# Patient Record
Sex: Female | Born: 1955 | Race: White | Hispanic: No | Marital: Married | Smoking: Never smoker
Health system: Southern US, Community
[De-identification: ages and names within clinical notes are randomized; demographics above are authoritative.]

## PROBLEM LIST (undated history)

## (undated) DIAGNOSIS — K811 Chronic cholecystitis: Secondary | ICD-10-CM

## (undated) DIAGNOSIS — I10 Essential (primary) hypertension: Secondary | ICD-10-CM

## (undated) DIAGNOSIS — C549 Malignant neoplasm of corpus uteri, unspecified: Secondary | ICD-10-CM

## (undated) DIAGNOSIS — Z8041 Family history of malignant neoplasm of ovary: Secondary | ICD-10-CM

## (undated) DIAGNOSIS — M1990 Unspecified osteoarthritis, unspecified site: Secondary | ICD-10-CM

## (undated) DIAGNOSIS — T7840XA Allergy, unspecified, initial encounter: Secondary | ICD-10-CM

## (undated) DIAGNOSIS — Z800 Family history of malignant neoplasm of digestive organs: Secondary | ICD-10-CM

## (undated) DIAGNOSIS — E785 Hyperlipidemia, unspecified: Secondary | ICD-10-CM

## (undated) HISTORY — DX: Malignant neoplasm of corpus uteri, unspecified: C54.9

## (undated) HISTORY — DX: Family history of malignant neoplasm of digestive organs: Z80.0

## (undated) HISTORY — DX: Hyperlipidemia, unspecified: E78.5

## (undated) HISTORY — PX: EYE SURGERY: SHX253

## (undated) HISTORY — DX: Unspecified osteoarthritis, unspecified site: M19.90

## (undated) HISTORY — PX: COLONOSCOPY: SHX174

## (undated) HISTORY — DX: Family history of malignant neoplasm of ovary: Z80.41

## (undated) HISTORY — PX: APPENDECTOMY: SHX54

## (undated) HISTORY — DX: Allergy, unspecified, initial encounter: T78.40XA

## (undated) HISTORY — DX: Chronic cholecystitis: K81.1

---

## 2005-05-23 LAB — CONVERTED CEMR LAB: Pap Smear: NORMAL

## 2006-09-30 ENCOUNTER — Ambulatory Visit: Payer: Self-pay | Admitting: Family Medicine

## 2006-09-30 DIAGNOSIS — E6609 Other obesity due to excess calories: Secondary | ICD-10-CM | POA: Insufficient documentation

## 2006-09-30 DIAGNOSIS — D259 Leiomyoma of uterus, unspecified: Secondary | ICD-10-CM | POA: Insufficient documentation

## 2006-09-30 DIAGNOSIS — E669 Obesity, unspecified: Secondary | ICD-10-CM

## 2006-09-30 DIAGNOSIS — I10 Essential (primary) hypertension: Secondary | ICD-10-CM | POA: Insufficient documentation

## 2006-10-03 LAB — CONVERTED CEMR LAB
ALT: 16 units/L (ref 0–35)
AST: 18 units/L (ref 0–37)
Alkaline Phosphatase: 62 units/L (ref 39–117)
Bilirubin, Direct: 0.1 mg/dL (ref 0.0–0.3)
CO2: 26 meq/L (ref 19–32)
Calcium: 8.9 mg/dL (ref 8.4–10.5)
Chloride: 101 meq/L (ref 96–112)
Cholesterol: 177 mg/dL (ref 0–200)
Creatinine, Ser: 0.7 mg/dL (ref 0.4–1.2)
Glucose, Bld: 99 mg/dL (ref 70–99)
Total Bilirubin: 0.8 mg/dL (ref 0.3–1.2)
Total CHOL/HDL Ratio: 5.2
Total Protein: 7.1 g/dL (ref 6.0–8.3)

## 2006-10-12 ENCOUNTER — Encounter: Payer: Self-pay | Admitting: Family Medicine

## 2006-10-28 ENCOUNTER — Ambulatory Visit: Payer: Self-pay | Admitting: Gastroenterology

## 2006-11-10 ENCOUNTER — Ambulatory Visit: Payer: Self-pay | Admitting: Gastroenterology

## 2006-11-10 ENCOUNTER — Encounter: Payer: Self-pay | Admitting: Family Medicine

## 2006-11-10 ENCOUNTER — Encounter: Payer: Self-pay | Admitting: Gastroenterology

## 2007-10-25 ENCOUNTER — Telehealth: Payer: Self-pay | Admitting: Family Medicine

## 2007-11-22 ENCOUNTER — Encounter: Payer: Self-pay | Admitting: Family Medicine

## 2007-11-22 ENCOUNTER — Other Ambulatory Visit: Admission: RE | Admit: 2007-11-22 | Discharge: 2007-11-22 | Payer: Self-pay | Admitting: Family Medicine

## 2007-11-22 ENCOUNTER — Ambulatory Visit: Payer: Self-pay | Admitting: Family Medicine

## 2007-11-22 DIAGNOSIS — M19049 Primary osteoarthritis, unspecified hand: Secondary | ICD-10-CM | POA: Insufficient documentation

## 2007-11-24 ENCOUNTER — Telehealth (INDEPENDENT_AMBULATORY_CARE_PROVIDER_SITE_OTHER): Payer: Self-pay | Admitting: *Deleted

## 2007-11-24 LAB — CONVERTED CEMR LAB
ALT: 52 units/L — ABNORMAL HIGH (ref 0–35)
Alkaline Phosphatase: 67 units/L (ref 39–117)
Bilirubin, Direct: 0.2 mg/dL (ref 0.0–0.3)
CO2: 29 meq/L (ref 19–32)
Glucose, Bld: 88 mg/dL (ref 70–99)
Potassium: 4.5 meq/L (ref 3.5–5.1)
Sodium: 142 meq/L (ref 135–145)
Total Bilirubin: 0.8 mg/dL (ref 0.3–1.2)
Total CHOL/HDL Ratio: 5.4
Total Protein: 7.2 g/dL (ref 6.0–8.3)

## 2007-12-12 ENCOUNTER — Encounter: Payer: Self-pay | Admitting: Family Medicine

## 2008-09-09 ENCOUNTER — Telehealth: Payer: Self-pay | Admitting: Family Medicine

## 2008-11-22 ENCOUNTER — Ambulatory Visit: Payer: Self-pay | Admitting: Family Medicine

## 2008-11-25 LAB — CONVERTED CEMR LAB
ALT: 24 units/L (ref 0–35)
AST: 22 units/L (ref 0–37)
Albumin: 3.9 g/dL (ref 3.5–5.2)
Alkaline Phosphatase: 63 units/L (ref 39–117)
Basophils Relative: 0 % (ref 0.0–3.0)
Bilirubin, Direct: 0.1 mg/dL (ref 0.0–0.3)
CO2: 28 meq/L (ref 19–32)
Calcium: 9.1 mg/dL (ref 8.4–10.5)
Creatinine, Ser: 0.8 mg/dL (ref 0.4–1.2)
Eosinophils Relative: 1.8 % (ref 0.0–5.0)
HDL: 32.8 mg/dL — ABNORMAL LOW (ref >39.00)
Hemoglobin: 13.3 g/dL (ref 12.0–15.0)
LDL Cholesterol: 106 mg/dL — ABNORMAL HIGH (ref 0–99)
Lymphocytes Relative: 35 % (ref 12.0–46.0)
MCHC: 34.3 g/dL (ref 30.0–36.0)
Monocytes Relative: 5.4 % (ref 3.0–12.0)
Neutro Abs: 5 10*3/uL (ref 1.4–7.7)
Neutrophils Relative %: 57.8 % (ref 43.0–77.0)
RBC: 4.45 M/uL (ref 3.87–5.11)
Sodium: 141 meq/L (ref 135–145)
Total CHOL/HDL Ratio: 5
Total Protein: 6.8 g/dL (ref 6.0–8.3)
Triglycerides: 106 mg/dL (ref 0.0–149.0)
WBC: 8.7 10*3/uL (ref 4.5–10.5)

## 2008-11-26 ENCOUNTER — Ambulatory Visit: Payer: Self-pay | Admitting: Family Medicine

## 2008-11-26 DIAGNOSIS — M1710 Unilateral primary osteoarthritis, unspecified knee: Secondary | ICD-10-CM

## 2008-11-26 DIAGNOSIS — IMO0002 Reserved for concepts with insufficient information to code with codable children: Secondary | ICD-10-CM | POA: Insufficient documentation

## 2009-05-28 ENCOUNTER — Telehealth: Payer: Self-pay | Admitting: Family Medicine

## 2010-03-11 ENCOUNTER — Telehealth (INDEPENDENT_AMBULATORY_CARE_PROVIDER_SITE_OTHER): Payer: Self-pay | Admitting: *Deleted

## 2010-03-16 ENCOUNTER — Ambulatory Visit
Admission: RE | Admit: 2010-03-16 | Discharge: 2010-03-16 | Payer: Self-pay | Source: Home / Self Care | Attending: Family Medicine | Admitting: Family Medicine

## 2010-03-16 ENCOUNTER — Other Ambulatory Visit: Payer: Self-pay | Admitting: Family Medicine

## 2010-03-16 LAB — BASIC METABOLIC PANEL
GFR: 103.75 mL/min (ref >60.00)
Glucose, Bld: 88 mg/dL (ref 70–99)
Potassium: 5.3 mEq/L — ABNORMAL HIGH (ref 3.5–5.1)
Sodium: 138 mEq/L (ref 135–145)

## 2010-03-16 LAB — LIPID PANEL
LDL Cholesterol: 115 mg/dL — ABNORMAL HIGH (ref 0–99)
Total CHOL/HDL Ratio: 5
VLDL: 19.2 mg/dL (ref 0.0–40.0)

## 2010-03-16 LAB — HEPATIC FUNCTION PANEL
AST: 20 U/L (ref 0–37)
Albumin: 4 g/dL (ref 3.5–5.2)
Alkaline Phosphatase: 65 U/L (ref 39–117)
Bilirubin, Direct: 0.1 mg/dL (ref 0.0–0.3)

## 2010-03-23 ENCOUNTER — Other Ambulatory Visit: Payer: Self-pay | Admitting: Family Medicine

## 2010-03-23 ENCOUNTER — Ambulatory Visit
Admission: RE | Admit: 2010-03-23 | Discharge: 2010-03-23 | Payer: Self-pay | Source: Home / Self Care | Attending: Family Medicine | Admitting: Family Medicine

## 2010-03-23 ENCOUNTER — Other Ambulatory Visit (HOSPITAL_COMMUNITY)
Admission: RE | Admit: 2010-03-23 | Discharge: 2010-03-23 | Disposition: A | Discharge status: Home / Self Care | Payer: Federal, State, Local not specified - PPO | Source: Ambulatory Visit | Attending: Family Medicine | Admitting: Family Medicine

## 2010-03-23 DIAGNOSIS — Z0189 Encounter for other specified special examinations: Secondary | ICD-10-CM | POA: Insufficient documentation

## 2010-03-23 DIAGNOSIS — E786 Lipoprotein deficiency: Secondary | ICD-10-CM

## 2010-03-23 DIAGNOSIS — N950 Postmenopausal bleeding: Secondary | ICD-10-CM | POA: Insufficient documentation

## 2010-03-23 DIAGNOSIS — Z1159 Encounter for screening for other viral diseases: Secondary | ICD-10-CM | POA: Insufficient documentation

## 2010-03-23 DIAGNOSIS — E7800 Pure hypercholesterolemia, unspecified: Secondary | ICD-10-CM | POA: Insufficient documentation

## 2010-03-25 NOTE — Progress Notes (Signed)
Summary: meloxicam  Phone Note Refill Request Message from:  Scriptline on May 28, 2009 8:59 AM  Refills Requested: Medication #1:  MELOXICAM 15 MG TABS Take 1 tablet by mouth once a day.   Supply Requested: 1 month [facility] [address]   Method Requested: Electronic Initial call taken by: Benny Lennert CMA (AAMA),  May 28, 2009 9:00 AM    Prescriptions: MELOXICAM 15 MG TABS (MELOXICAM) Take 1 tablet by mouth once a day  #30 x 3   Entered and Authorized by:   Kerby Nora MD   Signed by:   Kerby Nora MD on 05/28/2009   Method used:   Electronically to        [id number]       [address]       [place], [place]  [address]       Ph: [phone]       Fax: [phone]   RxID:   [id number]

## 2010-03-26 NOTE — Progress Notes (Signed)
----   Converted from flag ---- ---- 03/10/2010 5:41 PM, Kerby Nora MD wrote: Dx 401.1, v77.91 CMET, lipids  ---- 03/10/2010 11:12 AM, Liane Comber CMA (AAMA) wrote: Lab orders please! Good Morning! This pt is scheduled for cpx labs Monday, which labs to draw and dx codes to use? Thanks Tasha ------------------------------

## 2010-03-27 ENCOUNTER — Encounter: Payer: Self-pay | Admitting: Family Medicine

## 2010-03-27 ENCOUNTER — Encounter (INDEPENDENT_AMBULATORY_CARE_PROVIDER_SITE_OTHER): Payer: Self-pay | Admitting: *Deleted

## 2010-03-27 ENCOUNTER — Ambulatory Visit: Payer: Self-pay | Admitting: Family Medicine

## 2010-04-01 NOTE — Letter (Signed)
Summary: Results Follow up Letter  [place] [facility]  [address] [place], [place] [address]   Phone: [phone]  Fax: [phone]    03/27/2010 MRN: [id number]        [facility] [address] [place], [place]  [address]      Dear Ms. Mccown,  The following are the results of your recent test(s):  Test         Result    Pap Smear:        Normal ___x__  Not Normal _____ Comments:Repeat in 1 year ______________________________________________________ Cholesterol: LDL(Bad cholesterol):         Your goal is less than:         HDL (Good cholesterol):       Your goal is more than: Comments:  ______________________________________________________ Mammogram:        Normal _____  Not Normal _____ Comments:  ___________________________________________________________________ Hemoccult:        Normal _____  Not normal _______ Comments:    _____________________________________________________________________ Other Tests:    We routinely do not discuss normal results over the telephone.  If you desire a copy of the results, or you have any questions about this information we can discuss them at your next office visit.   Sincerely,  Kerby Nora MD

## 2010-04-01 NOTE — Assessment & Plan Note (Signed)
Summary: cpx/alc   Vital Signs:  Patient profile:   55 year old female Height:      66.5 inches Weight:      207 pounds BMI:     33.03 Temp:     98.6 degrees F oral Pulse rate:   64 / minute Pulse rhythm:   regular BP sitting:   124 / 80  (left arm) Cuff size:   large  Vitals Entered By: Benny Lennert CMA Duncan Dull) (March 23, 2010 1:55 PM)  History of Present Illness: Chief complaint cpx with pap  The patient is here for annual wellness exam and preventative care.     Vaginal discharge x several months. Bloody, yellowish colered...like a light period.  occuring daily.  No vaginal itching. No abdominal pain.   Has history of uterine fibroid.. has both uterus and ovaries. Went through menopause last year... had not had menses in last 6months. Currently with mild hot flashes, moodiness.  No family history of osteopososis, smoking or steroid use.   Hand and knee osteoarthritis wel controlle don meloxicam as needed. Does not take regularly.   High potassium... on ACEI...not on supplement, eating fruit and bannanas.   Hypertension History:      Well controlled one ramipril daliy.        Positive major cardiovascular risk factors include female age 81 years old or older, hyperlipidemia, and hypertension.  Negative major cardiovascular risk factors include non-tobacco-user status.     Problems Prior to Update: 1)  Screening For Lipoid Disorders  (ICD-V77.91) 2)  Osteoarthritis, Knees, Bilateral  (ICD-715.96) 3)  Routine Gynecological Examination  (ICD-V72.31) 4)  Well Adult  (ICD-V70.0) 5)  Osteoarthritis, Hands, Bilateral  (ICD-715.94) 6)  Other Screening Mammogram  (ICD-V76.12) 7)  Morbid Obesity  (ICD-278.01) 8)  Fibroids, Uterus  (ICD-218.9) 9)  Hypertension  (ICD-401.9)  Current Medications (verified): 1)  Ramipril 10 Mg  Caps (Ramipril) .Marland Kitchen.. 1 By Mouth Once Daily 2)  Meloxicam 15 Mg Tabs (Meloxicam) .... Take 1 Tablet By Mouth Once A Day  Allergies  (verified): No Known Drug Allergies  Past History:  Past medical, surgical, family and social histories (including risk factors) reviewed, and no changes noted (except as noted below).  Past Medical History: Reviewed history from 09/30/2006 and no changes required. Hypertension  Past Surgical History: Reviewed history from 09/30/2006 and no changes required. 1981 1984  Csections  Family History: Reviewed history from 09/30/2006 and no changes required. father age 72 pancreatic cancer, HTN mother age 37 HTN, chol no Mi < 30 3 brother: healthy PGM emphesema HTN in all grandparents MGF colon cancer  Social History: Reviewed history from 09/30/2006 and no changes required. Retired 2 sons: chron's disease Married x 30  Never Smoked Alcohol use-no Drug use-no Regular exercise-yes 5 days a week 40 minutes Diet: veggies, fruit, water, rare fast food  Review of Systems General:  Denies fatigue and fever. CV:  Denies chest pain or discomfort. Resp:  Denies shortness of breath. GI:  Denies abdominal pain and bloody stools. GU:  Denies dysuria. Derm:  Denies rash. Psych:  Denies anxiety and depression.  Physical Exam  General:  Overweight appearing female  Eyes:  No corneal or conjunctival inflammation noted. EOMI. Perrla. Funduscopic exam benign, without hemorrhages, exudates or papilledema. Vision grossly normal. Ears:  External ear exam shows no significant lesions or deformities.  Otoscopic examination reveals clear canals, tympanic membranes are intact bilaterally without bulging, retraction, inflammation or discharge. Hearing is grossly normal bilaterally. Nose:  External nasal  examination shows no deformity or inflammation. Nasal mucosa are pink and moist without lesions or exudates. Mouth:  Oral mucosa and oropharynx without lesions or exudates.  Teeth in good repair. Neck:  no carotid bruit or thyromegaly no cervical or supraclavicular lymphadenopathy  Chest Wall:   No deformities, masses, or tenderness noted. Breasts:  No mass, nodules, thickening, tenderness, bulging, retraction, inflamation, nipple discharge or skin changes noted.   Lungs:  Normal respiratory effort, chest expands symmetrically. Lungs are clear to auscultation, no crackles or wheezes. Heart:  Normal rate and regular rhythm. S1 and S2 normal without gallop, murmur, click, rub or other extra sounds. Abdomen:  Bowel sounds positive,abdomen soft and non-tender without masses, organomegaly or hernias noted. Genitalia:  Pelvic Exam:        External: normal female genitalia without lesions or masses        Vagina: normal without lesions or masses  small blood in vaginal canal        Cervix: normal without lesions or masses        Adnexa: normal bimanual exam without masses or fullness        Uterus: normal by palpation        Pap smear: performed Msk:  No deformity or scoliosis noted of thoracic or lumbar spine.   Pulses:  R and L posterior tibial pulses are full and equal bilaterally  Extremities:  no edema  Skin:  Intact without suspicious lesions or rashes Psych:  Cognition and judgment appear intact. Alert and cooperative with normal attention span and concentration. No apparent delusions, illusions, hallucinations   Impression & Recommendations:  Problem # 1:  WELL ADULT (ICD-V70.0) The patient's preventative maintenance and recommended screening tests for an annual wellness exam were reviewed in full today. Brought up to date unless services declined.  Counselled on the importance of diet, exercise, and its role in overall health and mortality. The patient's FH and SH was reviewed, including their home life, tobacco status, and drug and alcohol status.     Problem # 2:  ROUTINE GYNECOLOGICAL EXAMINATION (ICD-V72.31) PAp pending.  Problem # 3:  LOW HDL (ICD-272.5) Encouraged exercise, weight loss, healthy eating habits.. Increae veggie fats in diet.  Problem # 4:   HYPERTENSION (ICD-401.9) Well controlled. Continue current medication. Her updated medication list for this problem includes:    Ramipril 10 Mg Caps (Ramipril) .Marland Kitchen... 1 by mouth once daily  Orders: TLB-Potassium (K+) (84132-K)  Problem # 5:  MENORRHAGIA, POSTMENOPAUSAL (ICD-627.1)  Recent menopause < 6 months ago... no exactly postmenopausal bleeding given recent menses.  Vaginal exam nml. PAP pending. Eval with [place]....if nml... continue to follow.   Orders: Radiology Referral (Radiology)  Complete Medication List: 1)  Ramipril 10 Mg Caps (Ramipril) .Marland Kitchen.. 1 by mouth once daily 2)  Meloxicam 15 Mg Tabs (Meloxicam) .... Take 1 tablet by mouth once a day  Hypertension Assessment/Plan:      The patient's hypertensive risk group is category B: At least one risk factor (excluding diabetes) with no target organ damage.  Her calculated 10 year risk of coronary heart disease is 17 %.  Today's blood pressure is 124/80.  Her blood pressure goal is < 140/90.  Patient Instructions: 1)  Referral Appointment Information 2)  Day/Date: 3)  Time: 4)  Place/MD: 5)  Address: 6)  Phone/Fax: 7)  Patient given appointment information. Information/Orders faxed/mailed.  8)  Hold bannanas, and high potassium foods. 9)   Get regular exercsie... increase avacado, almonds, olive oil/canola oil.  Prescriptions: MELOXICAM 15 MG TABS (MELOXICAM) Take 1 tablet by mouth once a day  #30 x 3   Entered and Authorized by:   Kerby Nora MD   Signed by:   Kerby Nora MD on 03/23/2010   Method used:   Electronically to        [id number]       [address]       [place], [place]  [address]       Ph: [phone]       Fax: [phone]   RxID:   [id number] RAMIPRIL 10 MG  CAPS (RAMIPRIL) 1 by mouth once daily  #90 x 3   Entered and Authorized by:   Kerby Nora MD   Signed by:   Kerby Nora MD on 03/23/2010   Method used:   Electronically to        [id number]  ([facility]       [address]       [place], [place]  [address]       Ph: [phone]       Fax: [phone]   RxID:   [id number]    Orders Added: 1)  Radiology Referral [Radiology] 2)  TLB-Potassium (K+) [84132-K] 3)  Est. Patient 40-64 years [[address]] 4)  Radiology Referral [Radiology]    Current Allergies (reviewed today): No known allergies    Past Medical History:    Reviewed history from 09/30/2006 and no changes required:       Hypertension  Past Surgical History:    Reviewed history from 09/30/2006 and no changes required:       1981 1984  Csections   Last Flu Vaccine:  refused (11/22/2007 2:29:37 PM) Flu Vaccine Next Due:  Refused Last TD:  Tdap (11/26/2008 10:58:05 AM) TD Next Due:  10 yr

## 2010-04-14 ENCOUNTER — Ambulatory Visit: Payer: Federal, State, Local not specified - PPO | Admitting: Obstetrics and Gynecology

## 2010-04-14 DIAGNOSIS — N924 Excessive bleeding in the premenopausal period: Secondary | ICD-10-CM

## 2010-04-21 ENCOUNTER — Ambulatory Visit: Payer: Federal, State, Local not specified - PPO | Admitting: Obstetrics and Gynecology

## 2010-04-21 ENCOUNTER — Encounter: Payer: Self-pay | Admitting: Family Medicine

## 2010-04-21 ENCOUNTER — Ambulatory Visit: Payer: Self-pay | Admitting: Family Medicine

## 2010-04-21 DIAGNOSIS — C549 Malignant neoplasm of corpus uteri, unspecified: Secondary | ICD-10-CM

## 2010-04-22 ENCOUNTER — Ambulatory Visit: Payer: Federal, State, Local not specified - PPO | Attending: Gynecologic Oncology | Admitting: Gynecologic Oncology

## 2010-04-22 DIAGNOSIS — C549 Malignant neoplasm of corpus uteri, unspecified: Secondary | ICD-10-CM | POA: Insufficient documentation

## 2010-04-22 DIAGNOSIS — Z800 Family history of malignant neoplasm of digestive organs: Secondary | ICD-10-CM | POA: Insufficient documentation

## 2010-04-24 ENCOUNTER — Encounter (INDEPENDENT_AMBULATORY_CARE_PROVIDER_SITE_OTHER): Payer: Self-pay | Admitting: *Deleted

## 2010-04-29 ENCOUNTER — Ambulatory Visit: Payer: Federal, State, Local not specified - PPO | Admitting: Obstetrics & Gynecology

## 2010-04-30 NOTE — Letter (Signed)
Summary: Results Follow up Letter  [place] at [facility]  [address] [place], [place] [address]   Phone: [phone]  Fax: [phone]    04/24/2010 MRN: [id number]     [facility] [address] [place], [place]  [address]    Dear Ms. Borsuk,  The following are the results of your recent test(s):  Test         Result    Pap Smear:        Normal _____  Not Normal _____ Comments: ______________________________________________________ Cholesterol: LDL(Bad cholesterol):         Your goal is less than:         HDL (Good cholesterol):       Your goal is more than: Comments:  ______________________________________________________ Mammogram:        Normal ___x__  Not Normal _____ Comments:Repeat in 0ne year  ___________________________________________________________________ Hemoccult:        Normal _____  Not normal _______ Comments:    _____________________________________________________________________ Other Tests:    We routinely do not discuss normal results over the telephone.  If you desire a copy of the results, or you have any questions about this information we can discuss them at your next office visit.   Sincerely,  Kerby Nora MD

## 2010-04-30 NOTE — Letter (Signed)
Summary: Results Follow up Letter  [place] at [facility]  [address] [place], [place] [address]   Phone: [phone]  Fax: [phone]    04/24/2010 MRN: [id number]     [facility] [address] [place], [place]  [address]    Dear Ms. Maxcy,  The following are the results of your recent test(s):  Test         Result    Pap Smear:        Normal _____  Not Normal _____ Comments: ______________________________________________________ Cholesterol: LDL(Bad cholesterol):         Your goal is less than:         HDL (Good cholesterol):       Your goal is more than: Comments:  ______________________________________________________ Mammogram:        Normal __x___  Not Normal _____ Comments:Repeat in 1 year  ___________________________________________________________________ Hemoccult:        Normal _____  Not normal _______ Comments:    _____________________________________________________________________ Other Tests:    We routinely do not discuss normal results over the telephone.  If you desire a copy of the results, or you have any questions about this information we can discuss them at your next office visit.   Sincerely,  Kerby Nora MD

## 2010-05-08 HISTORY — PX: ABDOMINAL HYSTERECTOMY: SHX81

## 2010-05-15 NOTE — Consult Note (Signed)
NAME:  Melissa [name], Melissa [name]          ACCOUNT NO.:  [id number]  MEDICAL RECORD NO.:  [id number]           PATIENT TYPE:  O  LOCATION:          FACILITY:  [facility]  PHYSICIAN:  Allyn Bertoni A. Duard Brady, MD    DATE OF BIRTH:  [birth year]  DATE OF CONSULTATION:  04/22/2010 DATE OF DISCHARGE:                                  CONSULTATION   HISTORY:  The patient is seen today in consultation at the request of Dr. [name].  Melissa [name] is a very pleasant 55 year old gravida 2, para 2 who went through menopause about one or two years ago.  She never took any hormone replacement therapy.  She has had a bit of a watery discharge and for the past few months there has been a little bit of a pink discharge that lasts about one or two days.  She was seen by Dr. Pattricia Boss for her primary gynecologic care on January 30.  Pap smear at that time revealed endometrial cells and she was referred to Dr. [name] for an endometrial biopsy, which she underwent on April 14, 2010. 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  She is overall doing quite well.  She denies any pain.  She occasionally has some abdominal pelvic cramping. She denies any change in her bowel or bladder habits, any nausea, vomiting, fevers, chills, chest pain, shortness of breath, unintentional weight loss or weight gain.  REVIEW OF SYSTEMS:  The 10-point review of systems is negative.  MEDICATIONS:  Include ramipril 10 mg daily and meloxicam p.r.n.  ALLERGIES:  NONE.  PAST SURGICAL HISTORY:  She has had two cesarean sections.  She had a wisdom tooth extraction.  She has had gum surgery.  FAMILY HISTORY:  Significant for hypertension.  She has a brother with diabetes.  Her father had pancreatic cancer at the age of 6.  PAST MEDICAL HISTORY:  Hypertension.  SOCIAL HISTORY:  She denies the use of tobacco.  She drinks alcohol occasionally.  She is at home.  She is married.  Her husband is  retired from [employer].  She has two boys, one is 30 and one is 11.  HEALTH MAINTENANCE:  She is up-to-date on her mammograms, having had one yesterday.  She is due for a colonoscopy in 2013.  PHYSICAL EXAMINATION:  VITAL SIGNS:  Weight 203 pounds, height 5 feet 7, BMI 32, blood pressure 138/90, pulse 72, temperature 97.9, respirations 16. GENERAL:  A well-nourished, well-developed female in no acute distress. NECK:  Supple.  There is no lymphadenopathy.  No thyromegaly. LUNGS:  Clear to auscultation bilaterally. CARDIOVASCULAR:  Regular rate and rhythm. ABDOMEN:  Soft, nontender, nondistended.  There are no palpable masses or hepatosplenomegaly. GROINS:  Negative for adenopathy. EXTREMITIES:  There was no edema. PELVIC:  External genitalia is within normal limits.  The vagina is somewhat atrophic.  The cervix is visualized.  It is nulliparous.  There is pink mucousy fluid.  There is no visible lesions.  Bimanual examination:  The cervix is palpably normal.  Corpus is of normal size, shape and consistency.  There is no adnexal masses or nodularity.  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  She came accompanied with her husband today.  We discussed the need to proceed with hysterectomy and bilateral salpingo- oophorectomy.  Hysterectomy would involve removal of the uterus and cervix.  We also discussed the need to do pelvic and periaortic lymphadenectomy.  We discussed open surgery versus minimally invasive approach and they would like to proceed with a minimally invasive approach such as robotic.  We discussed surgical options here in [place].  I do not have any surgical opening in [place] until April which was longer than anyone in the room, including myself, was comfortable with so the patient opted to have surgery done at [facility].  She is scheduled for surgery at [facility] on March 16 with a preoperative  visit on March 9 at 1 p.m.  The risks and benefits of the surgery including, but not limited to, risk of infection, injury to surrounding organs including the bowel, the bladder, ureters, lymphedema, need for blood transfusion and thromboembolic disease were discussed with the patient.  She understands there is a risk for laparotomy and will accept that should that be the case so she is, of course, optimistic that we will be able to perform her surgery in a minimally invasive fashion.  Their questions were elicited and answered to their satisfaction.  They know that any adjuvant therapy will be based on the final pathology from her surgery.  She has my card.  She can call me if she has any questions prior to the date of surgery.     Roxie Kreeger A. Duard Brady, MD     PAG/MEDQ  D:  04/22/2010  T:  04/22/2010  Job:  [id number]  cc:   Horton Chin, MD  Telford Nab, R.N. [address] [place], [place] [address]  Dr. Lisabeth Pick  Electronically Signed by Cleda Mccreedy MD on 04/27/2010 04:42:11 PM

## 2010-05-15 NOTE — Assessment & Plan Note (Signed)
NAME:  Melissa [name], Melissa [name] NO.:  [id number]  MEDICAL RECORD NO.:  [id number]           PATIENT TYPE:  LOCATION:  [facility]           FACILITY:  PHYSICIAN:  Jaynie Collins, MD     DATE OF BIRTH:  October 10, 1955  DATE OF SERVICE:  04/14/2010                                 CLINIC NOTE  REASON FOR VISIT:  Evaluation of menopausal bleeding.  Melissa [name] is a 55 year old gravida 2, para 2-0-0-2 Caucasian female who was referred by [facility] and Dr. Kerby Nora for evaluation of menopausal bleeding.  The patient does report that her last normal menstrual period was around age 68.  She then went 5-6 months without any bleeding and then she had a light period and after that she continued to have a light period.  Pink discharge about every few months.  She has never gone a full year without any bleeding. Whenever she has this bleeding, she either wears a panty liner or a small tampon.  She does say that she ends up wearing panty liners every day because she always is either pink or brown discharge.  She says that she has occasional pelvic discomfort, but denies any symptoms of anemia such as lightheadedness, presyncopal symptoms, or any other systemic symptoms.  The patient was evaluated by Dr. Kerby Nora and Pap smear that was done on March 23, 2010, was negative for intraepithelial lesions or malignancy.  HPV was not detected; however, there were endometrial cells present.  She did have a followup pelvic ultrasound on March 27, 2010, which showed uterus measuring 9.9 cm x 5 cm x 4.15 cm with an endometrium measuring 19.1 mm in thickness, but no discreet uterine mass or other lesions were seen.  The right and left ovaries were visualized and found to be normal.  No other abnormal masses or free fluid in the pelvis was visualized.  After this evaluation, the patient was scheduled to come here for further evaluation.  PAST MEDICAL HISTORY: 1.  Hypertension. 2. Hyperlipidemia. 3. Hyperkalemia. 4. Osteoarthritis. 5. Obesity.  PAST SURGICAL HISTORY:  Cesarean sections in 1981 and 1984.  PAST OB/GYN HISTORY:  The patient had 2 cesarean sections.  Her last pregnancy was 27 years ago for contraception.  Her partner had vasectomy.  She denies any abnormal Pap smears.  Her last mammogram was in October 2009 and she is scheduled for one at the end of this month. Her last colonoscopy was in 2008.  MEDICATIONS:  Ramipril 10 mg daily, meloxicam 15 mg daily.  ALLERGIES:  No known drug allergies.  SOCIAL HISTORY:  The patient is retired.  She lives with her husband and her son.  She does not smoke, drink alcohol, or use any illicit drugs.  FAMILY HISTORY:  Remarkable for a father who had pancreatic cancer, mother with high blood pressure, brother with diabetes, and extensive family history of hypertension, maternal grandfather had colon cancer.  REVIEW OF SYSTEMS:  Remarkable for weight loss, loss of urine with coughing and sneezing, occasional hot flashes, vaginal odor, and vaginal bleeding.  PHYSICAL EXAMINATION:  VITAL SIGNS:  Blood pressure 137/90, pulse 66, weight 208 pounds. GENERAL:  No apparent distress. ABDOMEN:  Soft, nontender, nondistended. PELVIC:  Normal  external female genitalia with some atrophy noted externally.  Pink vagina with some loss of rugae.  The patient does have some brownish discharge that is seen in the vaginal vault.  No active bleeding seen.  She has normal cervical contour.  No lesions.  Normal- sized uterus on bimanual exam.  Normal adnexae bilaterally.  ENDOMETRIAL BIOPSY:  The patient was counseled regarding needing an endometrial biopsy.  The risks and benefits were reviewed and written informed consent was obtained.  During the pelvic examination, her cervix was swabbed with Betadine x2, the 3-mm Pipelle was then introduced into the fundal cavity to a depth of 90 cm, the suction  was activated, there was immediate return of blood, and the patient was noted to have increased bleeding.  After the Pipelle was introduced into the uterine cavity, 4 passes were made in order to get enough tissue because a lot of blood was coming back into the Pipelle.  The patient tolerated the procedure well.  She did have a small amount of bleeding with the procedure and needed to use about 5-6 cotton pads to clean out all the blood that was extruded during the biopsy.  All instruments were then removed from the patient's pelvis.  ASSESSMENT/PLAN:  The patient is a 55 year old gravida 2, para 2 here for evaluation of menopausal bleeding.  She just underwent an endometrial biopsy, we will follow up on the results and act accordingly.  The patient was told that if her bleeding gets worse that a hysteroscopy D and C might be required for further evaluation versus another appropriate treatment depending on the pathology results. Bleeding precautions were strictly reviewed.  The patient does say that the labs were checked at her primary care physician's office.  She was told to call them to verify that her CBC was indeed checked to rule out anemia and she was told to call or go to the emergency room with any bleeding problems.          ______________________________ Jaynie Collins, MD    UA/MEDQ  D:  04/14/2010  T:  04/15/2010  Job:  [id number]

## 2010-06-17 ENCOUNTER — Encounter: Payer: Federal, State, Local not specified - PPO | Admitting: Oncology

## 2010-06-17 ENCOUNTER — Other Ambulatory Visit: Payer: Self-pay | Admitting: Oncology

## 2010-06-17 ENCOUNTER — Encounter (HOSPITAL_BASED_OUTPATIENT_CLINIC_OR_DEPARTMENT_OTHER): Payer: Federal, State, Local not specified - PPO | Admitting: Oncology

## 2010-06-17 DIAGNOSIS — Z8542 Personal history of malignant neoplasm of other parts of uterus: Secondary | ICD-10-CM

## 2010-06-17 DIAGNOSIS — C55 Malignant neoplasm of uterus, part unspecified: Secondary | ICD-10-CM

## 2010-06-17 LAB — URINALYSIS, MICROSCOPIC - CHCC
Bilirubin (Urine): NEGATIVE
Ketones: NEGATIVE mg/dL
Protein: NEGATIVE mg/dL
Specific Gravity, Urine: 1.005 (ref 1.003–1.035)
pH: 7 (ref 4.6–8.0)

## 2010-06-17 LAB — CBC WITH DIFFERENTIAL/PLATELET
BASO%: 0.6 % (ref 0.0–2.0)
Basophils Absolute: 0 10*3/uL (ref 0.0–0.1)
EOS%: 1.1 % (ref 0.0–7.0)
HCT: 37.5 % (ref 34.8–46.6)
HGB: 12.4 g/dL (ref 11.6–15.9)
LYMPH%: 25.5 % (ref 14.0–49.7)
MCH: 28.9 pg (ref 25.1–34.0)
MCHC: 33 g/dL (ref 31.5–36.0)
MONO#: 0.4 10*3/uL (ref 0.1–0.9)
NEUT%: 68 % (ref 38.4–76.8)
Platelets: 227 10*3/uL (ref 145–400)
lymph#: 2.1 10*3/uL (ref 0.9–3.3)

## 2010-06-17 LAB — COMPREHENSIVE METABOLIC PANEL
AST: 14 U/L (ref 0–37)
Albumin: 4.3 g/dL (ref 3.5–5.2)
Alkaline Phosphatase: 71 U/L (ref 39–117)
BUN: 16 mg/dL (ref 6–23)
Calcium: 9.3 mg/dL (ref 8.4–10.5)
Chloride: 104 mEq/L (ref 96–112)
Glucose, Bld: 112 mg/dL — ABNORMAL HIGH (ref 70–99)
Potassium: 4.3 mEq/L (ref 3.5–5.3)
Sodium: 141 mEq/L (ref 135–145)
Total Protein: 7.1 g/dL (ref 6.0–8.3)

## 2010-06-22 ENCOUNTER — Encounter (HOSPITAL_COMMUNITY): Payer: Self-pay

## 2010-06-22 ENCOUNTER — Encounter (HOSPITAL_COMMUNITY)
Admission: RE | Admit: 2010-06-22 | Discharge: 2010-06-22 | Disposition: A | Discharge status: Home / Self Care | Payer: Federal, State, Local not specified - PPO | Source: Ambulatory Visit | Attending: Oncology | Admitting: Oncology

## 2010-06-22 ENCOUNTER — Ambulatory Visit: Payer: Federal, State, Local not specified - PPO | Attending: Radiation Oncology | Admitting: Radiation Oncology

## 2010-06-22 DIAGNOSIS — R300 Dysuria: Secondary | ICD-10-CM | POA: Insufficient documentation

## 2010-06-22 DIAGNOSIS — C55 Malignant neoplasm of uterus, part unspecified: Secondary | ICD-10-CM | POA: Insufficient documentation

## 2010-06-22 DIAGNOSIS — Z8542 Personal history of malignant neoplasm of other parts of uterus: Secondary | ICD-10-CM

## 2010-06-22 DIAGNOSIS — K7689 Other specified diseases of liver: Secondary | ICD-10-CM | POA: Insufficient documentation

## 2010-06-22 DIAGNOSIS — Z510 Encounter for antineoplastic radiation therapy: Secondary | ICD-10-CM | POA: Insufficient documentation

## 2010-06-22 DIAGNOSIS — R197 Diarrhea, unspecified: Secondary | ICD-10-CM | POA: Insufficient documentation

## 2010-06-22 DIAGNOSIS — R110 Nausea: Secondary | ICD-10-CM | POA: Insufficient documentation

## 2010-06-22 DIAGNOSIS — I10 Essential (primary) hypertension: Secondary | ICD-10-CM | POA: Insufficient documentation

## 2010-06-22 DIAGNOSIS — Z90710 Acquired absence of both cervix and uterus: Secondary | ICD-10-CM | POA: Insufficient documentation

## 2010-06-22 HISTORY — DX: Essential (primary) hypertension: I10

## 2010-06-22 MED ORDER — IOHEXOL 300 MG/ML  SOLN
100.0000 mL | Freq: Once | INTRAMUSCULAR | Status: AC | PRN
  Administered 2010-06-22: 100 mL via INTRAVENOUS

## 2010-06-23 ENCOUNTER — Other Ambulatory Visit: Payer: Self-pay | Admitting: Oncology

## 2010-06-23 DIAGNOSIS — K769 Liver disease, unspecified: Secondary | ICD-10-CM

## 2010-06-25 ENCOUNTER — Ambulatory Visit (HOSPITAL_COMMUNITY)
Admission: RE | Admit: 2010-06-25 | Discharge: 2010-06-25 | Disposition: A | Discharge status: Home / Self Care | Payer: Federal, State, Local not specified - PPO | Source: Ambulatory Visit | Attending: Oncology | Admitting: Oncology

## 2010-06-25 DIAGNOSIS — C549 Malignant neoplasm of corpus uteri, unspecified: Secondary | ICD-10-CM | POA: Insufficient documentation

## 2010-06-25 DIAGNOSIS — K7689 Other specified diseases of liver: Secondary | ICD-10-CM | POA: Insufficient documentation

## 2010-06-25 DIAGNOSIS — I10 Essential (primary) hypertension: Secondary | ICD-10-CM | POA: Insufficient documentation

## 2010-06-25 DIAGNOSIS — K769 Liver disease, unspecified: Secondary | ICD-10-CM

## 2010-06-25 DIAGNOSIS — Z90710 Acquired absence of both cervix and uterus: Secondary | ICD-10-CM | POA: Insufficient documentation

## 2010-06-25 DIAGNOSIS — D1803 Hemangioma of intra-abdominal structures: Secondary | ICD-10-CM | POA: Insufficient documentation

## 2010-06-25 MED ORDER — GADOBENATE DIMEGLUMINE 529 MG/ML IV SOLN
20.0000 mL | Freq: Once | INTRAVENOUS | Status: AC | PRN
  Administered 2010-06-25: 20 mL via INTRAVENOUS

## 2010-07-01 ENCOUNTER — Encounter (HOSPITAL_BASED_OUTPATIENT_CLINIC_OR_DEPARTMENT_OTHER): Payer: Federal, State, Local not specified - PPO | Admitting: Oncology

## 2010-07-01 DIAGNOSIS — Z5111 Encounter for antineoplastic chemotherapy: Secondary | ICD-10-CM

## 2010-07-01 DIAGNOSIS — C55 Malignant neoplasm of uterus, part unspecified: Secondary | ICD-10-CM

## 2010-07-06 ENCOUNTER — Other Ambulatory Visit: Payer: Self-pay | Admitting: Oncology

## 2010-07-06 ENCOUNTER — Encounter (HOSPITAL_BASED_OUTPATIENT_CLINIC_OR_DEPARTMENT_OTHER): Payer: Federal, State, Local not specified - PPO | Admitting: Oncology

## 2010-07-06 DIAGNOSIS — C55 Malignant neoplasm of uterus, part unspecified: Secondary | ICD-10-CM

## 2010-07-06 DIAGNOSIS — C549 Malignant neoplasm of corpus uteri, unspecified: Secondary | ICD-10-CM

## 2010-07-06 LAB — COMPREHENSIVE METABOLIC PANEL
Alkaline Phosphatase: 70 U/L (ref 39–117)
BUN: 16 mg/dL (ref 6–23)
CO2: 28 mEq/L (ref 19–32)
Glucose, Bld: 110 mg/dL — ABNORMAL HIGH (ref 70–99)
Sodium: 137 mEq/L (ref 135–145)
Total Bilirubin: 0.6 mg/dL (ref 0.3–1.2)
Total Protein: 6.8 g/dL (ref 6.0–8.3)

## 2010-07-06 LAB — CBC WITH DIFFERENTIAL/PLATELET
Basophils Absolute: 0 10*3/uL (ref 0.0–0.1)
Eosinophils Absolute: 0.1 10*3/uL (ref 0.0–0.5)
HCT: 40.4 % (ref 34.8–46.6)
HGB: 13.6 g/dL (ref 11.6–15.9)
LYMPH%: 18.3 % (ref 14.0–49.7)
MCV: 87.5 fL (ref 79.5–101.0)
MONO#: 0.2 10*3/uL (ref 0.1–0.9)
MONO%: 2.9 % (ref 0.0–14.0)
NEUT#: 5.4 10*3/uL (ref 1.5–6.5)
NEUT%: 77.8 % — ABNORMAL HIGH (ref 38.4–76.8)
Platelets: 236 10*3/uL (ref 145–400)
RBC: 4.61 10*6/uL (ref 3.70–5.45)
WBC: 7 10*3/uL (ref 3.9–10.3)

## 2010-07-06 LAB — MAGNESIUM: Magnesium: 1.9 mg/dL (ref 1.5–2.5)

## 2010-07-15 ENCOUNTER — Other Ambulatory Visit: Payer: Self-pay | Admitting: Oncology

## 2010-07-15 ENCOUNTER — Encounter (HOSPITAL_BASED_OUTPATIENT_CLINIC_OR_DEPARTMENT_OTHER): Payer: Federal, State, Local not specified - PPO | Admitting: Oncology

## 2010-07-15 DIAGNOSIS — C55 Malignant neoplasm of uterus, part unspecified: Secondary | ICD-10-CM

## 2010-07-15 LAB — CBC WITH DIFFERENTIAL/PLATELET
BASO%: 0.1 % (ref 0.0–2.0)
Eosinophils Absolute: 0.1 10*3/uL (ref 0.0–0.5)
HCT: 33.9 % — ABNORMAL LOW (ref 34.8–46.6)
MCHC: 33.3 g/dL (ref 31.5–36.0)
MONO#: 0.3 10*3/uL (ref 0.1–0.9)
NEUT#: 3.6 10*3/uL (ref 1.5–6.5)
NEUT%: 69.9 % (ref 38.4–76.8)
Platelets: 122 10*3/uL — ABNORMAL LOW (ref 145–400)
RBC: 3.9 10*6/uL (ref 3.70–5.45)
WBC: 5.1 10*3/uL (ref 3.9–10.3)
lymph#: 1.1 10*3/uL (ref 0.9–3.3)

## 2010-07-15 LAB — BASIC METABOLIC PANEL
CO2: 26 mEq/L (ref 19–32)
Chloride: 107 mEq/L (ref 96–112)
Glucose, Bld: 82 mg/dL (ref 70–99)
Sodium: 141 mEq/L (ref 135–145)

## 2010-07-16 ENCOUNTER — Telehealth: Payer: Self-pay | Admitting: *Deleted

## 2010-07-16 NOTE — Telephone Encounter (Signed)
Have pt stay of BP med but continue to follow BPs every now and then. Remove from med list when abstraction performed.

## 2010-07-16 NOTE — Telephone Encounter (Signed)
Pt has been diagnosed with uterine cancer and her oncologist told her that she thought her BP was running too low with the altace that she was taking.  When she was there on 5/14 it was 101/65.  She has not taken any since then and her pressures have been 105/75, 112/75, 128/79, 120/76, 121/78 and 112/74.

## 2010-07-17 ENCOUNTER — Encounter (HOSPITAL_BASED_OUTPATIENT_CLINIC_OR_DEPARTMENT_OTHER): Payer: Federal, State, Local not specified - PPO | Admitting: Oncology

## 2010-07-17 DIAGNOSIS — C55 Malignant neoplasm of uterus, part unspecified: Secondary | ICD-10-CM

## 2010-07-17 NOTE — Telephone Encounter (Signed)
Patient advised and medication removed

## 2010-07-22 ENCOUNTER — Other Ambulatory Visit: Payer: Self-pay | Admitting: Oncology

## 2010-07-22 ENCOUNTER — Encounter (HOSPITAL_BASED_OUTPATIENT_CLINIC_OR_DEPARTMENT_OTHER): Payer: Federal, State, Local not specified - PPO | Admitting: Oncology

## 2010-07-22 DIAGNOSIS — C55 Malignant neoplasm of uterus, part unspecified: Secondary | ICD-10-CM

## 2010-07-22 LAB — CBC WITH DIFFERENTIAL/PLATELET
Basophils Absolute: 0 10*3/uL (ref 0.0–0.1)
Eosinophils Absolute: 0.4 10*3/uL (ref 0.0–0.5)
HCT: 34.6 % — ABNORMAL LOW (ref 34.8–46.6)
HGB: 11.8 g/dL (ref 11.6–15.9)
LYMPH%: 19.5 % (ref 14.0–49.7)
MCH: 29.5 pg (ref 25.1–34.0)
MCV: 86.9 fL (ref 79.5–101.0)
MONO%: 10.7 % (ref 0.0–14.0)
NEUT#: 2.6 10*3/uL (ref 1.5–6.5)
NEUT%: 61.2 % (ref 38.4–76.8)
Platelets: 186 10*3/uL (ref 145–400)

## 2010-07-28 ENCOUNTER — Other Ambulatory Visit: Payer: Self-pay | Admitting: Oncology

## 2010-07-28 ENCOUNTER — Encounter (HOSPITAL_BASED_OUTPATIENT_CLINIC_OR_DEPARTMENT_OTHER): Payer: Federal, State, Local not specified - PPO | Admitting: Oncology

## 2010-07-28 DIAGNOSIS — C55 Malignant neoplasm of uterus, part unspecified: Secondary | ICD-10-CM

## 2010-07-28 LAB — CBC WITH DIFFERENTIAL/PLATELET
EOS%: 3.1 % (ref 0.0–7.0)
Eosinophils Absolute: 0.1 10*3/uL (ref 0.0–0.5)
LYMPH%: 11.5 % — ABNORMAL LOW (ref 14.0–49.7)
MCH: 29.4 pg (ref 25.1–34.0)
MCHC: 33.7 g/dL (ref 31.5–36.0)
MCV: 87.4 fL (ref 79.5–101.0)
MONO%: 8.3 % (ref 0.0–14.0)
NEUT#: 3.6 10*3/uL (ref 1.5–6.5)
Platelets: 207 10*3/uL (ref 145–400)
RBC: 3.98 10*6/uL (ref 3.70–5.45)

## 2010-07-28 LAB — COMPREHENSIVE METABOLIC PANEL
Alkaline Phosphatase: 64 U/L (ref 39–117)
Glucose, Bld: 96 mg/dL (ref 70–99)
Sodium: 139 mEq/L (ref 135–145)
Total Bilirubin: 0.3 mg/dL (ref 0.3–1.2)
Total Protein: 6.8 g/dL (ref 6.0–8.3)

## 2010-07-29 ENCOUNTER — Encounter (HOSPITAL_BASED_OUTPATIENT_CLINIC_OR_DEPARTMENT_OTHER): Payer: Federal, State, Local not specified - PPO | Admitting: Oncology

## 2010-07-29 DIAGNOSIS — Z5111 Encounter for antineoplastic chemotherapy: Secondary | ICD-10-CM

## 2010-07-29 DIAGNOSIS — C55 Malignant neoplasm of uterus, part unspecified: Secondary | ICD-10-CM

## 2010-08-05 ENCOUNTER — Other Ambulatory Visit: Payer: Self-pay | Admitting: Oncology

## 2010-08-05 ENCOUNTER — Encounter (HOSPITAL_BASED_OUTPATIENT_CLINIC_OR_DEPARTMENT_OTHER): Payer: Federal, State, Local not specified - PPO | Admitting: Oncology

## 2010-08-05 DIAGNOSIS — C549 Malignant neoplasm of corpus uteri, unspecified: Secondary | ICD-10-CM

## 2010-08-05 DIAGNOSIS — C55 Malignant neoplasm of uterus, part unspecified: Secondary | ICD-10-CM

## 2010-08-05 DIAGNOSIS — I10 Essential (primary) hypertension: Secondary | ICD-10-CM

## 2010-08-05 LAB — CBC WITH DIFFERENTIAL/PLATELET
Eosinophils Absolute: 0.7 10*3/uL — ABNORMAL HIGH (ref 0.0–0.5)
HCT: 36.4 % (ref 34.8–46.6)
LYMPH%: 8.6 % — ABNORMAL LOW (ref 14.0–49.7)
MCHC: 33.2 g/dL (ref 31.5–36.0)
MONO#: 0.7 10*3/uL (ref 0.1–0.9)
NEUT#: 5 10*3/uL (ref 1.5–6.5)
NEUT%: 71.3 % (ref 38.4–76.8)
Platelets: 211 10*3/uL (ref 145–400)
WBC: 7 10*3/uL (ref 3.9–10.3)

## 2010-08-05 LAB — COMPREHENSIVE METABOLIC PANEL
BUN: 15 mg/dL (ref 6–23)
CO2: 28 mEq/L (ref 19–32)
Creatinine, Ser: 0.66 mg/dL (ref 0.50–1.10)
Glucose, Bld: 82 mg/dL (ref 70–99)
Total Bilirubin: 0.3 mg/dL (ref 0.3–1.2)

## 2010-08-11 ENCOUNTER — Encounter (HOSPITAL_BASED_OUTPATIENT_CLINIC_OR_DEPARTMENT_OTHER): Payer: Federal, State, Local not specified - PPO | Admitting: Oncology

## 2010-08-11 ENCOUNTER — Other Ambulatory Visit: Payer: Self-pay | Admitting: Oncology

## 2010-08-11 DIAGNOSIS — C55 Malignant neoplasm of uterus, part unspecified: Secondary | ICD-10-CM

## 2010-08-11 LAB — CBC WITH DIFFERENTIAL/PLATELET
Eosinophils Absolute: 0.3 10*3/uL (ref 0.0–0.5)
HCT: 35 % (ref 34.8–46.6)
LYMPH%: 9.4 % — ABNORMAL LOW (ref 14.0–49.7)
MONO#: 0.5 10*3/uL (ref 0.1–0.9)
NEUT#: 5.1 10*3/uL (ref 1.5–6.5)
NEUT%: 77.7 % — ABNORMAL HIGH (ref 38.4–76.8)
Platelets: 168 10*3/uL (ref 145–400)
RBC: 4 10*6/uL (ref 3.70–5.45)
WBC: 6.6 10*3/uL (ref 3.9–10.3)

## 2010-08-18 ENCOUNTER — Other Ambulatory Visit: Payer: Self-pay | Admitting: Oncology

## 2010-08-18 ENCOUNTER — Encounter (HOSPITAL_BASED_OUTPATIENT_CLINIC_OR_DEPARTMENT_OTHER): Payer: Federal, State, Local not specified - PPO | Admitting: Oncology

## 2010-08-18 DIAGNOSIS — C55 Malignant neoplasm of uterus, part unspecified: Secondary | ICD-10-CM

## 2010-08-18 LAB — CBC WITH DIFFERENTIAL/PLATELET
BASO%: 0.3 % (ref 0.0–2.0)
Basophils Absolute: 0 10*3/uL (ref 0.0–0.1)
HCT: 33.9 % — ABNORMAL LOW (ref 34.8–46.6)
HGB: 11.6 g/dL (ref 11.6–15.9)
LYMPH%: 15.4 % (ref 14.0–49.7)
MCHC: 34.2 g/dL (ref 31.5–36.0)
MONO#: 0.4 10*3/uL (ref 0.1–0.9)
NEUT%: 71.6 % (ref 38.4–76.8)
Platelets: 184 10*3/uL (ref 145–400)
WBC: 3.9 10*3/uL (ref 3.9–10.3)

## 2010-08-25 ENCOUNTER — Encounter (HOSPITAL_BASED_OUTPATIENT_CLINIC_OR_DEPARTMENT_OTHER): Payer: Federal, State, Local not specified - PPO | Admitting: Oncology

## 2010-08-25 ENCOUNTER — Other Ambulatory Visit: Payer: Self-pay | Admitting: Oncology

## 2010-08-25 DIAGNOSIS — C55 Malignant neoplasm of uterus, part unspecified: Secondary | ICD-10-CM

## 2010-08-25 LAB — CBC WITH DIFFERENTIAL/PLATELET
BASO%: 0.2 % (ref 0.0–2.0)
Basophils Absolute: 0 10*3/uL (ref 0.0–0.1)
EOS%: 4.1 % (ref 0.0–7.0)
HCT: 33 % — ABNORMAL LOW (ref 34.8–46.6)
LYMPH%: 13.3 % — ABNORMAL LOW (ref 14.0–49.7)
MCH: 30.4 pg (ref 25.1–34.0)
MCHC: 34.7 g/dL (ref 31.5–36.0)
MCV: 87.6 fL (ref 79.5–101.0)
MONO%: 10.7 % (ref 0.0–14.0)
NEUT%: 71.7 % (ref 38.4–76.8)
Platelets: 213 10*3/uL (ref 145–400)
lymph#: 0.7 10*3/uL — ABNORMAL LOW (ref 0.9–3.3)

## 2010-09-01 ENCOUNTER — Other Ambulatory Visit: Payer: Self-pay | Admitting: Oncology

## 2010-09-01 ENCOUNTER — Encounter (HOSPITAL_BASED_OUTPATIENT_CLINIC_OR_DEPARTMENT_OTHER): Payer: Federal, State, Local not specified - PPO | Admitting: Oncology

## 2010-09-01 DIAGNOSIS — C55 Malignant neoplasm of uterus, part unspecified: Secondary | ICD-10-CM

## 2010-09-01 LAB — CBC WITH DIFFERENTIAL/PLATELET
Basophils Absolute: 0 10*3/uL (ref 0.0–0.1)
Eosinophils Absolute: 0.1 10*3/uL (ref 0.0–0.5)
LYMPH%: 16.4 % (ref 14.0–49.7)
MCH: 29.9 pg (ref 25.1–34.0)
MCHC: 33.8 g/dL (ref 31.5–36.0)
MCV: 88.6 fL (ref 79.5–101.0)
NEUT%: 71.1 % (ref 38.4–76.8)
RBC: 3.83 10*6/uL (ref 3.70–5.45)
RDW: 15.3 % — ABNORMAL HIGH (ref 11.2–14.5)
lymph#: 0.7 10*3/uL — ABNORMAL LOW (ref 0.9–3.3)

## 2010-09-01 LAB — COMPREHENSIVE METABOLIC PANEL
BUN: 16 mg/dL (ref 6–23)
CO2: 28 mEq/L (ref 19–32)
Creatinine, Ser: 0.66 mg/dL (ref 0.50–1.10)
Glucose, Bld: 87 mg/dL (ref 70–99)
Sodium: 139 mEq/L (ref 135–145)
Total Bilirubin: 0.4 mg/dL (ref 0.3–1.2)
Total Protein: 6.6 g/dL (ref 6.0–8.3)

## 2010-09-09 ENCOUNTER — Ambulatory Visit: Payer: Federal, State, Local not specified - PPO | Attending: Gynecologic Oncology | Admitting: Gynecologic Oncology

## 2010-09-09 DIAGNOSIS — K7689 Other specified diseases of liver: Secondary | ICD-10-CM | POA: Insufficient documentation

## 2010-09-09 DIAGNOSIS — C55 Malignant neoplasm of uterus, part unspecified: Secondary | ICD-10-CM | POA: Insufficient documentation

## 2010-09-09 DIAGNOSIS — K5900 Constipation, unspecified: Secondary | ICD-10-CM | POA: Insufficient documentation

## 2010-09-11 NOTE — Consult Note (Signed)
Melissa [name], Melissa [name]          ACCOUNT NO.:  [id number]  MEDICAL RECORD NO.:  [id number]  LOCATION:  GYN                          FACILITY:  [facility]  PHYSICIAN:  Pearla Mckinny A. Duard Brady, MD    DATE OF BIRTH:  03/06/1955  DATE OF CONSULTATION:  09/09/2010 DATE OF DISCHARGE:                                CONSULTATION   Melissa [name] is a very pleasant 55 year old, who is referred to me by Dr. [name].  She had some watery discharge for several months.  Pap smear revealed endometrial cells.  She was referred to Dr. [name] who performed an endometrial biopsy, revealed a grade 2 endometrial adenocarcinoma.  Due to timing of surgery, she subsequently underwent surgery at [facility] on May 08, 2010.  Final pathology was consistent with a stage IIIC1 endometrial adenocarcinoma.  Within the uterus, she had a grade 3 endometrial carcinoma with 73% myometrial invasion.  Tumor was ER/PR positive, p53 negative.  There was lymphovascular space involvement.  One out of 25 lymph nodes was involved.  There was no tumor in the adnexa.  The one lymph node that was involved was 1 out of 7 left pelvic lymph nodes while 0 out of 7 right pelvic lymph nodes, 0 out of 7 right paraortic and 0 out of 7 paraortic were involved.  After discussion of treatment options, she decided to go under treatment on GOG 258 randomized with chemotherapy radiation followed by chemotherapy.  She has completed her chemotherapy and radiation with external beam as well as vaginal cuff brachytherapy and cisplatin.  She is scheduled to start her paclitaxel and carboplatin at the end of this month.  She has overall tolerated her treatments quite well.  Postoperatively, she did have imaging consisting of CT scan of the abdomen and pelvis on June 22, 2010.  It revealed several low attenuation lesions in the right hepatic lobe.  They were felt to not be very specific.  They did not meet criteria for cysts; however, there was no  other evidence of metastatic disease.  She comes in today.  She states she feels almost normal.  She had some loose stools during radiation but that has improved.  She is now having some constipation and needs to use a stool softener.  She denies any vaginal bleeding. She has no neuropathy, nausea or vomiting.  PHYSICAL EXAMINATION:  VITAL SIGNS:  Weight 194 pounds which is down from 203 pounds when we initially saw her, blood pressure 130/80, pulse 62, respirations 16, temperature 97.5. GENERAL:  Well-nourished, well-developed female in no acute distress. NECK:  Supple.  There is no lymphadenopathy, no thyromegaly. LUNGS:  Clear to auscultation bilaterally. CARDIOVASCULAR:  Regular rate and rhythm. ABDOMEN:  Soft, nontender, nondistended.  There are no palpable masses or hepatosplenomegaly.  Groins are negative for adenopathy. EXTREMITIES:  Without edema. PELVIC:  External genitalia is within normal limits though somewhat atrophic.  The vaginal cuff is visualized.  There are no visible lesions.  Bimanual examination reveals no masses or nodularity. RECTAL:  Confirms.  ASSESSMENT AND PLAN:  A 55 year old with stage IIIC1 endometrial carcinoma who is on GOG protocol 258 randomized to chemoradiation followed by chemotherapy.  She will be starting her paclitaxel and  carboplatin at the end of the month under the care of Dr. Darrold Span.  We will schedule her an appointment to see [place] later in the year when she has completed her therapy.  She was seen by the protocol office staff today.     Raed Schalk A. Duard Brady, MD     PAG/MEDQ  D:  09/09/2010  T:  09/09/2010  Job:  [id number]  cc:   Billie Lade, Ph.D., M.D. Fax: [phone]  Reece Packer, M.D. Fax: [phone]  Kerby Nora, MD Fax: [phone]  Horton Chin, MD  Telford Nab, R.N. [phone] [place], [place] [address]  Electronically Signed by Cleda Mccreedy MD on 09/11/2010 10:25:52 AM

## 2010-09-22 ENCOUNTER — Encounter (HOSPITAL_BASED_OUTPATIENT_CLINIC_OR_DEPARTMENT_OTHER): Payer: Federal, State, Local not specified - PPO | Admitting: Oncology

## 2010-09-22 ENCOUNTER — Other Ambulatory Visit: Payer: Self-pay | Admitting: Oncology

## 2010-09-22 ENCOUNTER — Ambulatory Visit
Admission: RE | Admit: 2010-09-22 | Discharge: 2010-09-22 | Disposition: A | Discharge status: Home / Self Care | Payer: Federal, State, Local not specified - PPO | Source: Ambulatory Visit | Attending: Radiation Oncology | Admitting: Radiation Oncology

## 2010-09-22 DIAGNOSIS — Z5111 Encounter for antineoplastic chemotherapy: Secondary | ICD-10-CM

## 2010-09-22 DIAGNOSIS — C549 Malignant neoplasm of corpus uteri, unspecified: Secondary | ICD-10-CM

## 2010-09-22 DIAGNOSIS — C55 Malignant neoplasm of uterus, part unspecified: Secondary | ICD-10-CM

## 2010-09-22 LAB — COMPREHENSIVE METABOLIC PANEL
ALT: 18 U/L (ref 0–35)
AST: 16 U/L (ref 0–37)
Albumin: 4.1 g/dL (ref 3.5–5.2)
Alkaline Phosphatase: 83 U/L (ref 39–117)
BUN: 13 mg/dL (ref 6–23)
CO2: 25 mEq/L (ref 19–32)
Calcium: 9.9 mg/dL (ref 8.4–10.5)
Chloride: 104 mEq/L (ref 96–112)
Creatinine, Ser: 0.61 mg/dL (ref 0.50–1.10)
Glucose, Bld: 168 mg/dL — ABNORMAL HIGH (ref 70–99)
Potassium: 4.8 mEq/L (ref 3.5–5.3)
Sodium: 138 mEq/L (ref 135–145)
Total Bilirubin: 0.3 mg/dL (ref 0.3–1.2)
Total Protein: 7.7 g/dL (ref 6.0–8.3)

## 2010-09-22 LAB — CBC WITH DIFFERENTIAL/PLATELET
Eosinophils Absolute: 0 10*3/uL (ref 0.0–0.5)
LYMPH%: 6.3 % — ABNORMAL LOW (ref 14.0–49.7)
MONO#: 0 10*3/uL — ABNORMAL LOW (ref 0.1–0.9)
NEUT#: 6.7 10*3/uL — ABNORMAL HIGH (ref 1.5–6.5)
Platelets: 221 10*3/uL (ref 145–400)
RBC: 4.08 10*6/uL (ref 3.70–5.45)
RDW: 14.8 % — ABNORMAL HIGH (ref 11.2–14.5)
WBC: 7.1 10*3/uL (ref 3.9–10.3)

## 2010-09-23 ENCOUNTER — Encounter (HOSPITAL_BASED_OUTPATIENT_CLINIC_OR_DEPARTMENT_OTHER): Payer: Federal, State, Local not specified - PPO | Admitting: Oncology

## 2010-09-23 DIAGNOSIS — C549 Malignant neoplasm of corpus uteri, unspecified: Secondary | ICD-10-CM

## 2010-09-28 ENCOUNTER — Other Ambulatory Visit: Payer: Self-pay | Admitting: Oncology

## 2010-09-28 ENCOUNTER — Encounter (HOSPITAL_BASED_OUTPATIENT_CLINIC_OR_DEPARTMENT_OTHER): Payer: Federal, State, Local not specified - PPO | Admitting: Oncology

## 2010-09-28 DIAGNOSIS — I10 Essential (primary) hypertension: Secondary | ICD-10-CM

## 2010-09-28 DIAGNOSIS — C55 Malignant neoplasm of uterus, part unspecified: Secondary | ICD-10-CM

## 2010-09-28 DIAGNOSIS — C549 Malignant neoplasm of corpus uteri, unspecified: Secondary | ICD-10-CM

## 2010-09-28 LAB — CBC WITH DIFFERENTIAL/PLATELET
Eosinophils Absolute: 0.2 10*3/uL (ref 0.0–0.5)
MONO#: 0.6 10*3/uL (ref 0.1–0.9)
NEUT#: 1.4 10*3/uL — ABNORMAL LOW (ref 1.5–6.5)
RBC: 4.17 10*6/uL (ref 3.70–5.45)
RDW: 14.6 % — ABNORMAL HIGH (ref 11.2–14.5)
WBC: 2.9 10*3/uL — ABNORMAL LOW (ref 3.9–10.3)
nRBC: 0 % (ref 0–0)

## 2010-10-05 ENCOUNTER — Other Ambulatory Visit: Payer: Self-pay | Admitting: Oncology

## 2010-10-05 ENCOUNTER — Encounter: Payer: Federal, State, Local not specified - PPO | Admitting: Oncology

## 2010-10-05 DIAGNOSIS — I10 Essential (primary) hypertension: Secondary | ICD-10-CM

## 2010-10-05 DIAGNOSIS — C549 Malignant neoplasm of corpus uteri, unspecified: Secondary | ICD-10-CM

## 2010-10-05 LAB — CBC WITH DIFFERENTIAL/PLATELET
BASO%: 0.3 % (ref 0.0–2.0)
Basophils Absolute: 0 10*3/uL (ref 0.0–0.1)
HCT: 37.8 % (ref 34.8–46.6)
HGB: 12.2 g/dL (ref 11.6–15.9)
MCHC: 32.3 g/dL (ref 31.5–36.0)
MONO#: 0.6 10*3/uL (ref 0.1–0.9)
NEUT%: 82.5 % — ABNORMAL HIGH (ref 38.4–76.8)
RDW: 14.1 % (ref 11.2–14.5)
WBC: 10.6 10*3/uL — ABNORMAL HIGH (ref 3.9–10.3)
lymph#: 1.1 10*3/uL (ref 0.9–3.3)

## 2010-10-09 ENCOUNTER — Other Ambulatory Visit: Payer: Self-pay | Admitting: Oncology

## 2010-10-09 ENCOUNTER — Encounter (HOSPITAL_BASED_OUTPATIENT_CLINIC_OR_DEPARTMENT_OTHER): Payer: Federal, State, Local not specified - PPO | Admitting: Oncology

## 2010-10-09 DIAGNOSIS — C549 Malignant neoplasm of corpus uteri, unspecified: Secondary | ICD-10-CM

## 2010-10-09 DIAGNOSIS — I10 Essential (primary) hypertension: Secondary | ICD-10-CM

## 2010-10-09 DIAGNOSIS — C55 Malignant neoplasm of uterus, part unspecified: Secondary | ICD-10-CM

## 2010-10-09 LAB — COMPREHENSIVE METABOLIC PANEL
ALT: 37 U/L — ABNORMAL HIGH (ref 0–35)
Alkaline Phosphatase: 103 U/L (ref 39–117)
Sodium: 142 mEq/L (ref 135–145)
Total Bilirubin: 0.3 mg/dL (ref 0.3–1.2)
Total Protein: 6.6 g/dL (ref 6.0–8.3)

## 2010-10-09 LAB — CBC WITH DIFFERENTIAL/PLATELET
BASO%: 0.3 % (ref 0.0–2.0)
EOS%: 0.9 % (ref 0.0–7.0)
LYMPH%: 14.4 % (ref 14.0–49.7)
MCHC: 34.3 g/dL (ref 31.5–36.0)
MCV: 89.4 fL (ref 79.5–101.0)
MONO%: 7.2 % (ref 0.0–14.0)
Platelets: 174 10*3/uL (ref 145–400)
RBC: 3.95 10*6/uL (ref 3.70–5.45)
WBC: 6 10*3/uL (ref 3.9–10.3)

## 2010-10-12 ENCOUNTER — Encounter (HOSPITAL_BASED_OUTPATIENT_CLINIC_OR_DEPARTMENT_OTHER): Payer: Federal, State, Local not specified - PPO | Admitting: Oncology

## 2010-10-12 DIAGNOSIS — Z5111 Encounter for antineoplastic chemotherapy: Secondary | ICD-10-CM

## 2010-10-12 DIAGNOSIS — C549 Malignant neoplasm of corpus uteri, unspecified: Secondary | ICD-10-CM

## 2010-10-13 ENCOUNTER — Encounter (HOSPITAL_BASED_OUTPATIENT_CLINIC_OR_DEPARTMENT_OTHER): Payer: Federal, State, Local not specified - PPO | Admitting: Oncology

## 2010-10-13 DIAGNOSIS — C549 Malignant neoplasm of corpus uteri, unspecified: Secondary | ICD-10-CM

## 2010-10-29 ENCOUNTER — Ambulatory Visit: Payer: Federal, State, Local not specified - PPO | Attending: Gynecologic Oncology | Admitting: Gynecologic Oncology

## 2010-10-29 ENCOUNTER — Encounter: Payer: Federal, State, Local not specified - PPO | Admitting: Oncology

## 2010-10-29 ENCOUNTER — Ambulatory Visit: Payer: Federal, State, Local not specified - PPO | Admitting: Gynecologic Oncology

## 2010-10-29 ENCOUNTER — Other Ambulatory Visit: Payer: Self-pay | Admitting: Oncology

## 2010-10-29 DIAGNOSIS — I10 Essential (primary) hypertension: Secondary | ICD-10-CM | POA: Insufficient documentation

## 2010-10-29 DIAGNOSIS — C549 Malignant neoplasm of corpus uteri, unspecified: Secondary | ICD-10-CM | POA: Insufficient documentation

## 2010-10-29 DIAGNOSIS — C55 Malignant neoplasm of uterus, part unspecified: Secondary | ICD-10-CM

## 2010-10-29 LAB — CBC WITH DIFFERENTIAL/PLATELET
BASO%: 0.1 % (ref 0.0–2.0)
Eosinophils Absolute: 0.1 10*3/uL (ref 0.0–0.5)
LYMPH%: 9.9 % — ABNORMAL LOW (ref 14.0–49.7)
MCHC: 34.2 g/dL (ref 31.5–36.0)
MCV: 91.9 fL (ref 79.5–101.0)
MONO%: 4.2 % (ref 0.0–14.0)
NEUT%: 84.6 % — ABNORMAL HIGH (ref 38.4–76.8)
Platelets: 117 10*3/uL — ABNORMAL LOW (ref 145–400)
RBC: 3.52 10*6/uL — ABNORMAL LOW (ref 3.70–5.45)

## 2010-10-29 LAB — COMPREHENSIVE METABOLIC PANEL
Alkaline Phosphatase: 99 U/L (ref 39–117)
Creatinine, Ser: 0.6 mg/dL (ref 0.50–1.10)
Glucose, Bld: 84 mg/dL (ref 70–99)
Sodium: 142 mEq/L (ref 135–145)
Total Bilirubin: 0.3 mg/dL (ref 0.3–1.2)
Total Protein: 6.6 g/dL (ref 6.0–8.3)

## 2010-10-30 NOTE — Consult Note (Signed)
[name], [name] NO.:  [id number]  MEDICAL RECORD NO.:  [id number]  LOCATION:  GYN                          FACILITY:  [facility]  PHYSICIAN:  Laurette Schimke, MD     DATE OF BIRTH:  September 14, 1955  DATE OF CONSULTATION:  10/29/2010 DATE OF DISCHARGE:                                CONSULTATION   REASON FOR VISIT:  Stage IIIC endometrial cancer, undergoing treatment.  HISTORY OF PRESENT ILLNESS:  This is a 55 year old who initially presented to Dr. [name] with complaints of watery vaginal discharge of 7 months' duration.  A Pap test at that time demonstrated presence of endometrial cells.  An endometrial biopsy demonstrated presence of a grade 2 endometrial adenocarcinoma.  She underwent surgical staging at [facility] on May 08, 2010.  Final pathology demonstrated a stage IIIC1 endometrial adenocarcinoma.  The tumor was grade 3 with 73% myometrial invasion.  There was lymphovascular space involvement and 1/25 lymph nodes were noted to be positive.  The positive lymph node was identified within the pelvis and periaortic lymph nodes were negative.  After discussion with the patient regarding treatment options, she elected to participate in GOG 258.  She was randomized to treatment with chemotherapy, radiation followed by chemotherapy.  She has completed the first 3 cycles of chemotherapy and external beam and vaginal cuff brachytherapy. She has received 1 subsequent cycle of Taxol and carboplatin and is scheduled to receive cycle 5 of Taxol and carboplatin on November 02, 2010.  PAST MEDICAL HISTORY: 1. Hypertension. 2. Stage IIIC, grade 3 endometrial adenocarcinoma.  PAST SURGICAL HISTORY:  Cesarean section, endometrial cancer staging in March 2012.  FAMILY HISTORY:  No interval changes.  SOCIAL HISTORY:  No interval changes.  REVIEW OF SYSTEMS:  No chest pain, shortness of breath, headache. Reports early satiety.  No nausea, vomiting.  No fever, chills.   She denies hematochezia, hematocolpos, or hematuria.  She reports numbness on bilateral soles of her feet which have not gotten worse.  Otherwise, 10-point review of systems is within normal limits.  The patient's performance status is 0.  PHYSICAL EXAMINATION:  GENERAL:  A well-developed female, in no acute distress. VITAL SIGNS:  Weight 194, height 5 feet 7 inches, blood pressure 130/80, pulse 62, temperature 97.5. CHEST:  Clear to auscultation. HEART:  Regular rate and rhythm.  Normal S1 and S2. ABDOMEN:  Soft, nontender without any masses. LYMPH NODE SURVEY:  No cervical, supraclavicular, or inguinal adenopathy. BACK:  No CVA tenderness. ABDOMEN:  Soft, obese, nontender.  No palpable masses. PELVIC:  Normal external genitalia, Bartholin's, urethra, and Skene's. Atrophic vagina.  No lesions noted.  No cul-de-sac nodularity.  RECTAL: Good anal sphincter tone without any masses.  No rectovaginal septum nodularity. EXTREMITIES:  No clubbing, cyanosis, or edema.  IMPRESSION:  [name] is a stage IIIC1, grade 3 endometrial adenocarcinoma.  She will receive cycle #5 of Taxol and carboplatin on November 02, 2010.  The plan is for followup GYN evaluation at the last cycle per protocol.  The patient is doing well and extremely pleased with her progress.  All of patient's questions and those of her husband were answered.     Laurette Schimke, MD  WB/MEDQ  D:  10/29/2010  T:  10/29/2010  Job:  [id number]  cc:   Reece Packer, M.D. Fax: [phone]  Horton Chin, MD  Kerby Nora, MD Fax: [phone]  Billie Lade, Ph.D., M.D. Fax: [phone]  Telford Nab, R.N. [address] [place], [place] [address]  Electronically Signed by Laurette Schimke MD on 10/30/2010 10:59:37 AM

## 2010-11-02 ENCOUNTER — Encounter (HOSPITAL_BASED_OUTPATIENT_CLINIC_OR_DEPARTMENT_OTHER): Payer: Federal, State, Local not specified - PPO | Admitting: Oncology

## 2010-11-02 DIAGNOSIS — C549 Malignant neoplasm of corpus uteri, unspecified: Secondary | ICD-10-CM

## 2010-11-02 DIAGNOSIS — Z5111 Encounter for antineoplastic chemotherapy: Secondary | ICD-10-CM

## 2010-11-03 ENCOUNTER — Encounter (HOSPITAL_BASED_OUTPATIENT_CLINIC_OR_DEPARTMENT_OTHER): Payer: Federal, State, Local not specified - PPO | Admitting: Oncology

## 2010-11-03 DIAGNOSIS — Z5189 Encounter for other specified aftercare: Secondary | ICD-10-CM

## 2010-11-03 DIAGNOSIS — C549 Malignant neoplasm of corpus uteri, unspecified: Secondary | ICD-10-CM

## 2010-11-20 ENCOUNTER — Other Ambulatory Visit: Payer: Self-pay | Admitting: Oncology

## 2010-11-20 ENCOUNTER — Encounter (HOSPITAL_BASED_OUTPATIENT_CLINIC_OR_DEPARTMENT_OTHER): Payer: Federal, State, Local not specified - PPO | Admitting: Oncology

## 2010-11-20 DIAGNOSIS — Z5111 Encounter for antineoplastic chemotherapy: Secondary | ICD-10-CM

## 2010-11-20 DIAGNOSIS — C549 Malignant neoplasm of corpus uteri, unspecified: Secondary | ICD-10-CM

## 2010-11-20 DIAGNOSIS — I10 Essential (primary) hypertension: Secondary | ICD-10-CM

## 2010-11-20 DIAGNOSIS — C55 Malignant neoplasm of uterus, part unspecified: Secondary | ICD-10-CM

## 2010-11-20 LAB — COMPREHENSIVE METABOLIC PANEL
AST: 17 U/L (ref 0–37)
Albumin: 4.2 g/dL (ref 3.5–5.2)
Alkaline Phosphatase: 94 U/L (ref 39–117)
BUN: 13 mg/dL (ref 6–23)
Glucose, Bld: 77 mg/dL (ref 70–99)
Potassium: 4.4 mEq/L (ref 3.5–5.3)
Sodium: 140 mEq/L (ref 135–145)
Total Bilirubin: 0.3 mg/dL (ref 0.3–1.2)

## 2010-11-20 LAB — CBC WITH DIFFERENTIAL/PLATELET
Basophils Absolute: 0.1 10*3/uL (ref 0.0–0.1)
Eosinophils Absolute: 0 10*3/uL (ref 0.0–0.5)
HCT: 32.5 % — ABNORMAL LOW (ref 34.8–46.6)
HGB: 11.2 g/dL — ABNORMAL LOW (ref 11.6–15.9)
LYMPH%: 13.9 % — ABNORMAL LOW (ref 14.0–49.7)
MCV: 93 fL (ref 79.5–101.0)
MONO#: 0.4 10*3/uL (ref 0.1–0.9)
MONO%: 7.5 % (ref 0.0–14.0)
NEUT#: 3.8 10*3/uL (ref 1.5–6.5)
NEUT%: 76.3 % (ref 38.4–76.8)
Platelets: 137 10*3/uL — ABNORMAL LOW (ref 145–400)
RBC: 3.49 10*6/uL — ABNORMAL LOW (ref 3.70–5.45)

## 2010-11-23 ENCOUNTER — Encounter (HOSPITAL_BASED_OUTPATIENT_CLINIC_OR_DEPARTMENT_OTHER): Payer: Federal, State, Local not specified - PPO | Admitting: Oncology

## 2010-11-23 DIAGNOSIS — C549 Malignant neoplasm of corpus uteri, unspecified: Secondary | ICD-10-CM

## 2010-11-23 DIAGNOSIS — Z5111 Encounter for antineoplastic chemotherapy: Secondary | ICD-10-CM

## 2010-11-24 ENCOUNTER — Encounter: Payer: Self-pay | Admitting: *Deleted

## 2010-11-24 ENCOUNTER — Encounter (HOSPITAL_BASED_OUTPATIENT_CLINIC_OR_DEPARTMENT_OTHER): Payer: Federal, State, Local not specified - PPO | Admitting: Oncology

## 2010-11-24 DIAGNOSIS — C549 Malignant neoplasm of corpus uteri, unspecified: Secondary | ICD-10-CM

## 2010-11-24 DIAGNOSIS — Z5189 Encounter for other specified aftercare: Secondary | ICD-10-CM

## 2010-12-15 ENCOUNTER — Encounter (HOSPITAL_COMMUNITY): Payer: Self-pay

## 2010-12-15 ENCOUNTER — Ambulatory Visit (HOSPITAL_COMMUNITY)
Admission: RE | Admit: 2010-12-15 | Discharge: 2010-12-15 | Disposition: A | Discharge status: Home / Self Care | Payer: Federal, State, Local not specified - PPO | Source: Ambulatory Visit | Attending: Oncology | Admitting: Oncology

## 2010-12-15 ENCOUNTER — Other Ambulatory Visit (HOSPITAL_COMMUNITY): Payer: Federal, State, Local not specified - PPO

## 2010-12-15 DIAGNOSIS — C55 Malignant neoplasm of uterus, part unspecified: Secondary | ICD-10-CM

## 2010-12-15 DIAGNOSIS — C549 Malignant neoplasm of corpus uteri, unspecified: Secondary | ICD-10-CM | POA: Insufficient documentation

## 2010-12-15 DIAGNOSIS — D1803 Hemangioma of intra-abdominal structures: Secondary | ICD-10-CM | POA: Insufficient documentation

## 2010-12-15 MED ORDER — IOHEXOL 300 MG/ML  SOLN
100.0000 mL | Freq: Once | INTRAMUSCULAR | Status: AC | PRN
  Administered 2010-12-15: 100 mL via INTRAVENOUS

## 2010-12-16 ENCOUNTER — Telehealth: Payer: Self-pay | Admitting: Oncology

## 2010-12-16 NOTE — Telephone Encounter (Signed)
11/30 appts scheduled per Dondra Spry (research) and pof 10/24.

## 2010-12-17 ENCOUNTER — Ambulatory Visit: Payer: Federal, State, Local not specified - PPO | Attending: Gynecologic Oncology | Admitting: Gynecologic Oncology

## 2010-12-17 ENCOUNTER — Encounter (HOSPITAL_BASED_OUTPATIENT_CLINIC_OR_DEPARTMENT_OTHER): Payer: Federal, State, Local not specified - PPO | Admitting: Oncology

## 2010-12-17 ENCOUNTER — Other Ambulatory Visit: Payer: Self-pay | Admitting: Oncology

## 2010-12-17 DIAGNOSIS — Z5111 Encounter for antineoplastic chemotherapy: Secondary | ICD-10-CM

## 2010-12-17 DIAGNOSIS — Z923 Personal history of irradiation: Secondary | ICD-10-CM | POA: Insufficient documentation

## 2010-12-17 DIAGNOSIS — I10 Essential (primary) hypertension: Secondary | ICD-10-CM | POA: Insufficient documentation

## 2010-12-17 DIAGNOSIS — C549 Malignant neoplasm of corpus uteri, unspecified: Secondary | ICD-10-CM

## 2010-12-17 DIAGNOSIS — Z9221 Personal history of antineoplastic chemotherapy: Secondary | ICD-10-CM | POA: Insufficient documentation

## 2010-12-17 LAB — CBC WITH DIFFERENTIAL/PLATELET
Basophils Absolute: 0 10*3/uL (ref 0.0–0.1)
Eosinophils Absolute: 0 10*3/uL (ref 0.0–0.5)
HCT: 32.3 % — ABNORMAL LOW (ref 34.8–46.6)
HGB: 11.2 g/dL — ABNORMAL LOW (ref 11.6–15.9)
LYMPH%: 10.4 % — ABNORMAL LOW (ref 14.0–49.7)
MCV: 94.5 fL (ref 79.5–101.0)
MONO#: 0.3 10*3/uL (ref 0.1–0.9)
MONO%: 11.1 % (ref 0.0–14.0)
NEUT#: 2.2 10*3/uL (ref 1.5–6.5)
NEUT%: 76.6 % (ref 38.4–76.8)
Platelets: 251 10*3/uL (ref 145–400)
RBC: 3.42 10*6/uL — ABNORMAL LOW (ref 3.70–5.45)
WBC: 2.9 10*3/uL — ABNORMAL LOW (ref 3.9–10.3)
lymph#: 0.3 10*3/uL — ABNORMAL LOW (ref 0.9–3.3)

## 2010-12-17 LAB — COMPREHENSIVE METABOLIC PANEL
BUN: 13 mg/dL (ref 6–23)
CO2: 30 mEq/L (ref 19–32)
Chloride: 103 mEq/L (ref 96–112)
Glucose, Bld: 93 mg/dL (ref 70–99)
Sodium: 139 mEq/L (ref 135–145)
Total Bilirubin: 0.2 mg/dL — ABNORMAL LOW (ref 0.3–1.2)
Total Protein: 6.8 g/dL (ref 6.0–8.3)

## 2010-12-18 NOTE — Consult Note (Signed)
NAME:  Melissa [name], Melissa [name] NO.:  [id number]  MEDICAL RECORD NO.:  [id number]  LOCATION:  GYN                          FACILITY:  [facility]  PHYSICIAN:  Laurette Schimke, MD     DATE OF BIRTH:  1955/10/31  DATE OF CONSULTATION: DATE OF DISCHARGE:                                CONSULTATION   REASON FOR VISIT:  [name] presents today following completion of GOG 258, for treatment of stage IIIC1 endometrial cancer.  HISTORY OF PRESENT ILLNESS:  This is a 55 year old, who presented to Dr. [name] with complaints of watery vaginal discharge.  Endometrial biopsy demonstrated presence of a grade 2 endometrial cancer.  On May 08, 2010, she underwent surgical staging and final pathology noted a grade 3 tumor with 73% myometrial invasion.  There was lymphovascular space involvement in 1 of 25 lymph nodes, is noted to be positive.  The positive node was within the pelvis.  The periaortic lymph nodes were negative.  After discussion with the patient regarding her treatment option, she elected to participate in GOG 2008.  She has randomized treatment chemotherapy and chemotherapy radiation, followed by chemotherapy.  She completed the first 3 cycles of chemotherapy and external beam vaginal cuff brachytherapy.  She has subsequently received an additional 3 cycles of Taxol and carboplatin therapy.  PAST MEDICAL HISTORY:  Hypertension stage IIIC1 grade 3 endometrial cancer.  PAST SURGICAL HISTORY:  Cesarean section, endometrial cancer staging in March, 2012.  FAMILY HISTORY:  No interval changes.  SOCIAL HISTORY:  The patient takes care of herself.  She performs all activities of daily living and is actually responsible for mowing the lawn in her home.  REVIEW OF SYSTEMS:  No nausea, vomiting, fever, chills, shortness of breath.  She reports residual fatigue.  She reports neuropathy of the toes, the left is worse than the right.  No bleeding from the  rectum, bladder, vagina.  Otherwise, 10-point review of systems is negative, and the performance status is 0.  PHYSICAL EXAMINATION:  GENERAL:  Well-developed female in no acute distress. VITAL SIGNS:  Weight 197.7 pounds, blood pressure 130/78, pulse 70, temperature 98.5. CHEST:  Clear to auscultation. HEART:  Regular rate and rhythm. LYMPH NODES:  No cervical, supraclavicular, inguinal adenopathy. ABDOMEN:  Soft, obese, nontender.  No evidence of hernia.  No tenderness. BACK:  No CVA tenderness. GU:  Vagina is atrophic.  No lesions.  No masses appreciated. Internal examination, no cul-de-sac masses.  No nodularity in the rectovaginal septum. RECTAL:  Good anal sphincter tone without any masses.  IMPRESSION:  Melissa [name] has completed definitive treatment for her stage IIIC1 grade 3 endometrial cancer.  She will follow up as scheduled.  She will follow up in 3 months as per the protocol.  All of the patient's questions were answered.  She was counseled regarding her recent increase in weight.  She is counseled regarding importance of exercise.  She is encouraged to take supplemental B6 and B12.     Laurette Schimke, MD     WB/MEDQ  D:  12/17/2010  T:  12/17/2010  Job:  [id number]  cc:   Telford Nab, R.N. [address] [place], [place] [address]  Billie Lade,  Ph.D., M.D. Fax: [phone]  Kerby Nora, MD Fax: [phone]  Reece Packer, M.D. Fax: [phone]  Electronically Signed by Laurette Schimke MD on 12/18/2010 11:14:57 AM

## 2011-01-15 ENCOUNTER — Encounter: Payer: Self-pay | Admitting: *Deleted

## 2011-01-22 ENCOUNTER — Other Ambulatory Visit: Payer: Self-pay | Admitting: *Deleted

## 2011-01-22 ENCOUNTER — Ambulatory Visit (HOSPITAL_BASED_OUTPATIENT_CLINIC_OR_DEPARTMENT_OTHER): Payer: Federal, State, Local not specified - PPO | Admitting: Oncology

## 2011-01-22 ENCOUNTER — Other Ambulatory Visit: Payer: Self-pay | Admitting: Oncology

## 2011-01-22 ENCOUNTER — Other Ambulatory Visit (HOSPITAL_BASED_OUTPATIENT_CLINIC_OR_DEPARTMENT_OTHER): Payer: Federal, State, Local not specified - PPO | Admitting: Lab

## 2011-01-22 VITALS — BP 135/84 | HR 72 | Temp 98.4°F | Ht 67.0 in | Wt 201.8 lb

## 2011-01-22 DIAGNOSIS — Z5189 Encounter for other specified aftercare: Secondary | ICD-10-CM

## 2011-01-22 DIAGNOSIS — D649 Anemia, unspecified: Secondary | ICD-10-CM

## 2011-01-22 DIAGNOSIS — C549 Malignant neoplasm of corpus uteri, unspecified: Secondary | ICD-10-CM

## 2011-01-22 DIAGNOSIS — Z5111 Encounter for antineoplastic chemotherapy: Secondary | ICD-10-CM

## 2011-01-22 DIAGNOSIS — C541 Malignant neoplasm of endometrium: Secondary | ICD-10-CM

## 2011-01-22 DIAGNOSIS — G609 Hereditary and idiopathic neuropathy, unspecified: Secondary | ICD-10-CM

## 2011-01-22 DIAGNOSIS — C55 Malignant neoplasm of uterus, part unspecified: Secondary | ICD-10-CM

## 2011-01-22 DIAGNOSIS — I10 Essential (primary) hypertension: Secondary | ICD-10-CM

## 2011-01-22 LAB — CBC WITH DIFFERENTIAL/PLATELET
Basophils Absolute: 0 10*3/uL (ref 0.0–0.1)
Eosinophils Absolute: 0.1 10*3/uL (ref 0.0–0.5)
HGB: 11.6 g/dL (ref 11.6–15.9)
MONO#: 0.5 10*3/uL (ref 0.1–0.9)
NEUT#: 4.7 10*3/uL (ref 1.5–6.5)
Platelets: 199 10*3/uL (ref 145–400)
RBC: 3.62 10*6/uL — ABNORMAL LOW (ref 3.70–5.45)
RDW: 13.1 % (ref 11.2–14.5)
WBC: 6.2 10*3/uL (ref 3.9–10.3)

## 2011-01-22 LAB — COMPREHENSIVE METABOLIC PANEL
Albumin: 4.4 g/dL (ref 3.5–5.2)
CO2: 24 mEq/L (ref 19–32)
Calcium: 9.1 mg/dL (ref 8.4–10.5)
Glucose, Bld: 85 mg/dL (ref 70–99)
Potassium: 5 mEq/L (ref 3.5–5.3)
Sodium: 138 mEq/L (ref 135–145)
Total Protein: 6.8 g/dL (ref 6.0–8.3)

## 2011-01-23 NOTE — Progress Notes (Signed)
OFFICE PROGRESS NOTE INTERVAL HISTORY:   Date of Visit: 01-22-11    Physicians: P.Gehrig, J.Kinard, A.Bledsoe  Patient is seen, together with husband, in followup of the chemotherapy that she has received for endometrial carcinoma, this IIIC grade 3 with 1/25 nodes involved at surgery March 2012. She has been treated on protocol GOG 258 with total of 6 cycles of taxol/ carboplatin thru Oct.1, 2012. She additionally had external beam radiation + brachytherapy by Dr. Roselind Messier, which was given midway thru the chemotherapy. Per the protocol, she is being seen at 3 month intervals with pelvic exams at each visit, these by gyn oncology and radiation oncology.  Melissa [name] has gradually felt better since completing chemotherapy, particularly noticing that she does not have as much extreme fatigue, tho energy is still not back to baseline. Taste is back to normal and she has no nausea or vomiting now; bowels are moving regularly and she has no bladder complaints. She has persistent numbness and tingling in ball of left foot, with much less bothersome symptoms in right foot and none in hands. She has tried Vitamen B6 and 12 as gyn onc recommended, without improvement noted so far. She notices the symptoms more if she is standing for extended times; she does not seem to have difficulty with mobility or sleep due to the neuropathy. After discussion, she prefers to see if this improves over next several weeks. She may want to see a podiatrist if the unilateral symptoms persist, and/or try amitriptyline or gabapentin. She will follow this up with next visit to gyn onc. Review of Systems:  No fever or symptoms of infection. Hair is growing back. Appetite ok. No SOB or cough. No other pain. No bleeding. Remainder of 10 point ROS negative.  Objective: Alert, easily mobile, looks comfortable. Husband very supportive. Vital signs in last 24 hours:  BP 135/84  Pulse 72  Temp(Src) 98.4 F (36.9 C) (Oral)  Ht 5\' 7"   (1.702 m)  Wt 201 lb 12.8 oz (91.536 kg)  BMI 31.61 kg/m2    HEENT:mucous membranes moist, pharynx normal without lesions LymphaticsCervical, supraclavicular, and axillary nodes normal. Resp: clear to auscultation bilaterally and normal percussion bilaterally Cardio: regular rate and rhythm GI: soft, non-tender; bowel sounds normal; no masses,  no organomegaly Extremities: extremities normal, atraumatic, no cyanosis or edema Neuro:nonfocal other than sensory as above RUE with slightly palpable cord along vein used for last chemotherapy, no heat or erythema, no swelling.   Lab Results:   Basename 01/22/11 1050  WBC 6.2  HGB 11.6  HCT 34.1*  PLT 199  this Hgb is up from 11.2 in late Oct.  BMET  Basename 01/22/11 1050  NA 138  K 5.0  CL 105  CO2 24  GLUCOSE 85  BUN 20  CREATININE 0.68  CALCIUM 9.1    Studies/Results:  No results found.  Medications: I have reviewed the patient's current medications. Patient declined flu shot. Assessment/Plan:  1.  IIIC endometrial carcinoma, post treatment as above and continuing followup per protocol. I am glad to see her back at any time if I can be of help, however with other MDs closely involved, I have not given her a scheduled return visit here. 2. Peripheral neuropathy particularly in left foot: plan as above. 3.Anemia improving since completion of chemo and RT       Melissa [name] P, MD   01/23/2011, 8:37 PM

## 2011-03-19 ENCOUNTER — Ambulatory Visit: Payer: Federal, State, Local not specified - PPO | Admitting: Gynecology

## 2011-03-19 ENCOUNTER — Other Ambulatory Visit: Payer: Federal, State, Local not specified - PPO | Admitting: Lab

## 2011-03-24 ENCOUNTER — Encounter: Payer: Self-pay | Admitting: Gynecologic Oncology

## 2011-03-24 ENCOUNTER — Other Ambulatory Visit: Payer: Self-pay | Admitting: *Deleted

## 2011-03-24 DIAGNOSIS — C541 Malignant neoplasm of endometrium: Secondary | ICD-10-CM

## 2011-03-25 ENCOUNTER — Encounter: Payer: Self-pay | Admitting: Gynecologic Oncology

## 2011-03-25 ENCOUNTER — Other Ambulatory Visit (HOSPITAL_COMMUNITY)
Admission: RE | Admit: 2011-03-25 | Discharge: 2011-03-25 | Disposition: A | Discharge status: Home / Self Care | Payer: Federal, State, Local not specified - PPO | Source: Ambulatory Visit | Attending: Gynecologic Oncology | Admitting: Gynecologic Oncology

## 2011-03-25 ENCOUNTER — Ambulatory Visit: Payer: Federal, State, Local not specified - PPO | Attending: Gynecologic Oncology | Admitting: Gynecologic Oncology

## 2011-03-25 ENCOUNTER — Other Ambulatory Visit (HOSPITAL_BASED_OUTPATIENT_CLINIC_OR_DEPARTMENT_OTHER): Payer: Federal, State, Local not specified - PPO | Admitting: Lab

## 2011-03-25 VITALS — BP 116/80 | HR 74 | Temp 98.2°F | Resp 18 | Ht 69.37 in | Wt 196.8 lb

## 2011-03-25 DIAGNOSIS — C541 Malignant neoplasm of endometrium: Secondary | ICD-10-CM

## 2011-03-25 DIAGNOSIS — Z01419 Encounter for gynecological examination (general) (routine) without abnormal findings: Secondary | ICD-10-CM | POA: Insufficient documentation

## 2011-03-25 DIAGNOSIS — Z9079 Acquired absence of other genital organ(s): Secondary | ICD-10-CM | POA: Insufficient documentation

## 2011-03-25 DIAGNOSIS — Z8542 Personal history of malignant neoplasm of other parts of uterus: Secondary | ICD-10-CM | POA: Insufficient documentation

## 2011-03-25 DIAGNOSIS — Z79899 Other long term (current) drug therapy: Secondary | ICD-10-CM | POA: Insufficient documentation

## 2011-03-25 DIAGNOSIS — C55 Malignant neoplasm of uterus, part unspecified: Secondary | ICD-10-CM

## 2011-03-25 DIAGNOSIS — C549 Malignant neoplasm of corpus uteri, unspecified: Secondary | ICD-10-CM | POA: Insufficient documentation

## 2011-03-25 DIAGNOSIS — Z90710 Acquired absence of both cervix and uterus: Secondary | ICD-10-CM | POA: Insufficient documentation

## 2011-03-25 DIAGNOSIS — Z800 Family history of malignant neoplasm of digestive organs: Secondary | ICD-10-CM | POA: Insufficient documentation

## 2011-03-25 DIAGNOSIS — I10 Essential (primary) hypertension: Secondary | ICD-10-CM | POA: Insufficient documentation

## 2011-03-25 LAB — COMPREHENSIVE METABOLIC PANEL
ALT: 17 U/L (ref 0–35)
AST: 17 U/L (ref 0–37)
Albumin: 4.5 g/dL (ref 3.5–5.2)
Alkaline Phosphatase: 64 U/L (ref 39–117)
Potassium: 5 mEq/L (ref 3.5–5.3)
Sodium: 141 mEq/L (ref 135–145)
Total Protein: 6.8 g/dL (ref 6.0–8.3)

## 2011-03-25 NOTE — Patient Instructions (Signed)
RTC in 3 months

## 2011-03-25 NOTE — Progress Notes (Signed)
Addended by: Randel Pigg on: 03/25/2011 11:22 AM   Modules accepted: Orders

## 2011-03-25 NOTE — Progress Notes (Signed)
Consult Note: Gyn-Onc  [name] 56 y.o. female  CC:  Chief Complaint  Patient presents with  . Endometrial Cancer    Follow up    ZOX:WRUEAV FOR VISIT: Ms. Ulyssa Walthour presents today following  completion of GOG 258, for treatment of stage IIIC1 endometrial cancer.   HISTORY OF PRESENT ILLNESS: This is a 56 year old, who presented to Dr. [name] with complaints of watery vaginal discharge. Endometrial biopsy demonstrated presence of a grade 2 endometrial cancer. On May 08, 2010, she underwent surgical staging and final pathology noted a grade 3 tumor with 73% myometrial invasion. There was lymphovascular space involvement in 1 of 25 lymph nodes, is noted to be positive. The positive node was within the pelvis. The periaortic lymph nodes were negative. After discussion with the patient regarding her treatment option, she elected to participate in GOG 2008. She has randomized treatment chemotherapy and chemotherapy radiation, followed by chemotherapy. She completed the first 3 cycles of chemotherapy and external beam vaginal cuff brachytherapy. She has subsequently received an additional 3 cycles of Taxol and carboplatin therapy.   Interval History:   No new history.  Her MMG is due in 2/13. Review of Systems: No nausea, vomiting, fever, chills, shortness of breath. She reports residual fatigue. She reports neuropathy of the toes, the left is worse than the right. No bleeding from the rectum, bladder, vagina. Otherwise, 10-point review of systems is negative, and the performance status is 0.   Current Meds:  Outpatient Encounter Prescriptions as of 03/25/2011  Medication Sig Dispense Refill  . docusate sodium (COLACE) 100 MG capsule Take 100 mg by mouth 2 (two) times daily as needed.       . ramipril (ALTACE) 10 MG capsule Take 10 mg by mouth daily.      Marland Kitchen LORazepam (ATIVAN) 0.5 MG tablet Take 0.5 mg by mouth every 4 (four) hours as needed.        . Meloxicam (MOBIC PO) Take  10 mg by mouth 1 day or 1 dose.        . ondansetron (ZOFRAN) 8 MG tablet Take by mouth as directed.          Allergy: No Known Allergies  Social Hx:   History   Social History  . Marital Status: Married    Spouse Name: N/A    Number of Children: N/A  . Years of Education: N/A   Occupational History  . Not on file.   Social History Main Topics  . Smoking status: Never Smoker   . Smokeless tobacco: Not on file  . Alcohol Use: Yes     Occas  . Drug Use: Not on file  . Sexually Active: Not on file   Other Topics Concern  . Not on file   Social History Narrative  . No narrative on file    Past Surgical Hx:  Past Surgical History  Procedure Date  . Abdominal hysterectomy 05-08-10    LAPAROSCOPIC ROBOTIC ASSISSTED TOTAL HYSTERECTOMY WITH BSO , BILAT. PELVIC AND PERIAORTIC LYMPH NODE EVAL. BY DR. Duard Brady AT [facility]  . Cesarean section 01/08/80 11/21/82    x2    Past Medical Hx:  Past Medical History  Diagnosis Date  . Hypertension   . Cancer of uterine body dx'd 04/14/10    radical hysterectomy, chemotherapy and radiation therapy  . Osteoarthritis     HX OF SOME  OSTEOARTHRITIS    Family Hx:  Family History  Problem Relation Age of Onset  . Pancreatic cancer  Father   . Diabetes Brother     Vitals:  Blood pressure 116/80, pulse 74, temperature 98.2 F (36.8 C), resp. rate 18, height 5' 9.37" (1.762 m), weight 196 lb 12.8 oz (89.268 kg).  Physical Exam: PHYSICAL EXAMINATION: GENERAL: Well-developed female in no acute  distress.   CHEST: Clear to auscultation.  HEART: Regular rate and rhythm.  LYMPH NODES: No cervical, supraclavicular, inguinal adenopathy.  ABDOMEN: Soft, obese, nontender. No evidence of hernia. No tenderness.  BACK: No CVA tenderness.  GU: Vagina is atrophic. No lesions. No masses appreciated. Vaginal cuff without lesions, pap smear submitted.  Internal examination, no cul-de-sac masses. No nodularity in the rectovaginal septum.  RECTAL: Good  anal sphincter tone without any masses.    Assessment/Plan: IMPRESSION: Ms. Forde has completed definitive treatment for her stage IIIC1 grade 3 endometrial cancer. She will follow up as scheduled. She will follow up in 3 months as per the protocol. Per protocol she is due for a CT scan and CXR when she sees [place] in April.All of  the patient's questions were answered.  Shahrzad Koble A., MD 03/25/2011, 11:08 AM

## 2011-04-02 ENCOUNTER — Telehealth: Payer: Self-pay | Admitting: *Deleted

## 2011-04-02 NOTE — Telephone Encounter (Signed)
Patient informed of PAP results  

## 2011-04-15 ENCOUNTER — Encounter: Payer: Self-pay | Admitting: Gynecologic Oncology

## 2011-04-15 ENCOUNTER — Ambulatory Visit: Payer: Federal, State, Local not specified - PPO | Attending: Gynecologic Oncology | Admitting: Gynecologic Oncology

## 2011-04-15 ENCOUNTER — Ambulatory Visit (HOSPITAL_COMMUNITY)
Admission: RE | Admit: 2011-04-15 | Discharge: 2011-04-15 | Disposition: A | Discharge status: Home / Self Care | Payer: Federal, State, Local not specified - PPO | Source: Ambulatory Visit | Attending: Gynecologic Oncology | Admitting: Gynecologic Oncology

## 2011-04-15 VITALS — BP 106/78 | HR 68 | Temp 98.0°F | Resp 18 | Ht 67.72 in | Wt 194.9 lb

## 2011-04-15 DIAGNOSIS — Z90710 Acquired absence of both cervix and uterus: Secondary | ICD-10-CM | POA: Insufficient documentation

## 2011-04-15 DIAGNOSIS — M545 Low back pain, unspecified: Secondary | ICD-10-CM | POA: Insufficient documentation

## 2011-04-15 DIAGNOSIS — Z800 Family history of malignant neoplasm of digestive organs: Secondary | ICD-10-CM | POA: Insufficient documentation

## 2011-04-15 DIAGNOSIS — C541 Malignant neoplasm of endometrium: Secondary | ICD-10-CM

## 2011-04-15 DIAGNOSIS — Z8542 Personal history of malignant neoplasm of other parts of uterus: Secondary | ICD-10-CM | POA: Insufficient documentation

## 2011-04-15 DIAGNOSIS — M1990 Unspecified osteoarthritis, unspecified site: Secondary | ICD-10-CM | POA: Insufficient documentation

## 2011-04-15 DIAGNOSIS — D1803 Hemangioma of intra-abdominal structures: Secondary | ICD-10-CM | POA: Insufficient documentation

## 2011-04-15 DIAGNOSIS — R109 Unspecified abdominal pain: Secondary | ICD-10-CM | POA: Insufficient documentation

## 2011-04-15 DIAGNOSIS — N949 Unspecified condition associated with female genital organs and menstrual cycle: Secondary | ICD-10-CM | POA: Insufficient documentation

## 2011-04-15 DIAGNOSIS — C549 Malignant neoplasm of corpus uteri, unspecified: Secondary | ICD-10-CM | POA: Insufficient documentation

## 2011-04-15 DIAGNOSIS — Z923 Personal history of irradiation: Secondary | ICD-10-CM | POA: Insufficient documentation

## 2011-04-15 DIAGNOSIS — I10 Essential (primary) hypertension: Secondary | ICD-10-CM | POA: Insufficient documentation

## 2011-04-15 DIAGNOSIS — Z9221 Personal history of antineoplastic chemotherapy: Secondary | ICD-10-CM | POA: Insufficient documentation

## 2011-04-15 DIAGNOSIS — Z09 Encounter for follow-up examination after completed treatment for conditions other than malignant neoplasm: Secondary | ICD-10-CM | POA: Insufficient documentation

## 2011-04-15 MED ORDER — IOHEXOL 300 MG/ML  SOLN
100.0000 mL | Freq: Once | INTRAMUSCULAR | Status: AC | PRN
  Administered 2011-04-15: 100 mL via INTRAVENOUS

## 2011-04-15 NOTE — Patient Instructions (Signed)
Follow up with results of CT.

## 2011-04-15 NOTE — Progress Notes (Signed)
Test quicknote

## 2011-04-15 NOTE — Progress Notes (Signed)
Consult Note: Gyn-Onc  [name] 56 y.o. female  CC:  Chief Complaint  Patient presents with  . Endometrial Cancer    Follow up    HPI:HPI:REASON FOR VISIT: [name] presents today with new complaints following completion of GOG 258, for treatment of stage IIIC1 endometrial cancer.   HISTORY OF PRESENT ILLNESS: This is a 56 year old, who presented to Dr. [name] with complaints of watery vaginal discharge. Endometrial biopsy demonstrated presence of a grade 2 endometrial cancer. On May 08, 2010, she underwent surgical staging and final pathology noted a grade 3 tumor with 73% myometrial invasion. There was lymphovascular space involvement in 1 of 25 lymph nodes, is noted to be positive. The positive node was within the pelvis. The periaortic lymph nodes were negative. After discussion with the patient regarding her treatment option, she elected to participate in GOG 2008. She has randomized treatment chemotherapy and chemotherapy radiation, followed by chemotherapy. She completed the first 3 cycles of chemotherapy and external beam vaginal cuff brachytherapy. She has subsequently received an additional 3 cycles of Taxol and carboplatin therapy.  Interval History:  I last saw her 03/25/11 at which time she had no complaints, exam and pap smear were  Normal.  She comes in today c/o lower back pain and lower abdominal burning for 2 weeks.  It started shortly after I saw her.  She has had this back pain before. This pain is relieved with NSAIDS.  There has been no change in her bowel or bladder habits. She denies any vaginal bleeding. The low back pain does not radiate . She denies any weakness or numbness. This back pain as stated before has occurred previously but has been self-limited. This pain has not there continuously. She does feel like it comes and goes. It does not wake her up at night.    Review of Systems:  No nausea, vomiting, fever, chills, shortness of breath.  She reports residual fatigue. She reports neuropathy of the toes, the left is worse than the right. No bleeding from the rectum, bladder, vagina. Otherwise, 10-point review of systems is negative, and the performance status is 0.    Current Meds:  Outpatient Encounter Prescriptions as of 04/15/2011  Medication Sig Dispense Refill  . docusate sodium (COLACE) 100 MG capsule Take 100 mg by mouth 2 (two) times daily as needed.       . ramipril (ALTACE) 10 MG capsule Take 10 mg by mouth daily.      Marland Kitchen LORazepam (ATIVAN) 0.5 MG tablet Take 0.5 mg by mouth every 4 (four) hours as needed.        . Meloxicam (MOBIC PO) Take 10 mg by mouth 1 day or 1 dose.        . ondansetron (ZOFRAN) 8 MG tablet Take by mouth as directed.          Allergy: No Known Allergies  Social Hx:   History   Social History  . Marital Status: Married    Spouse Name: N/A    Number of Children: N/A  . Years of Education: N/A   Occupational History  . Not on file.   Social History Main Topics  . Smoking status: Never Smoker   . Smokeless tobacco: Not on file  . Alcohol Use: Yes     Occas  . Drug Use: Not on file  . Sexually Active: Not on file   Other Topics Concern  . Not on file   Social History Narrative  . No  narrative on file    Past Surgical Hx:  Past Surgical History  Procedure Date  . Abdominal hysterectomy 05-08-10    LAPAROSCOPIC ROBOTIC ASSISSTED TOTAL HYSTERECTOMY WITH BSO , BILAT. PELVIC AND PERIAORTIC LYMPH NODE EVAL. BY DR. Duard Brady AT [facility]  . Cesarean section 01/08/80 11/21/82    x2    Past Medical Hx:  Past Medical History  Diagnosis Date  . Hypertension   . Cancer of uterine body dx'd 04/14/10    radical hysterectomy, chemotherapy and radiation therapy  . Osteoarthritis     HX OF SOME  OSTEOARTHRITIS    Family Hx:  Family History  Problem Relation Age of Onset  . Pancreatic cancer Father   . Diabetes Brother     Vitals:  Blood pressure 106/78, pulse 68, temperature 98 F (36.7  C), temperature source Oral, resp. rate 18, height 5' 7.72" (1.72 m), weight 194 lb 14.4 oz (88.406 kg).  Physical Exam:  Well-nourished well-developed female in no acute distress.  Abdomen: Soft minimally tender. There is no rebound or guarding. There are no masses.   Pelvic: There are no masses. There is no nodularity. Rectal confirms.  Assessment/Plan: IMPRESSION: Ms. Trampe has completed definitive treatment for her stage IIIC1 grade 3 endometrial cancer.   She comes in today complaining of lower back and abdominal pain. All her exam is unremarkable with a two-week history and considering her pathology, I think is reasonable to proceed with imaging with a CT scan of the abdomen and pelvis. We will contact the patient with results. This could all be related to some radiculopathy with lower back pain but again considering her history the patient would like to proceed with imaging as stated above. Her disposition will be planned based on the results.  Miyo Aina A., MD 04/15/2011, 8:24 AM

## 2011-04-16 ENCOUNTER — Telehealth: Payer: Self-pay | Admitting: Gynecologic Oncology

## 2011-04-16 NOTE — Telephone Encounter (Signed)
Pt notified of CT Scan results: No acute findings, mass lesions, or lymphadenopathy.  Instructed that scan showed a large amount of stool in the colon suggesting constipation so the use of an over the counter laxative would be appropriate.  Informed that Isidoro Donning in the Research department would be notifying her if changes needed to be made in her CT Scan schedule per GOG protocol.  No concerns or questions voiced.

## 2011-04-23 ENCOUNTER — Telehealth: Payer: Self-pay | Admitting: Family Medicine

## 2011-04-23 MED ORDER — RAMIPRIL 10 MG PO CAPS: 10.0000 mg | ORAL_CAPSULE | Freq: Every day | ORAL | Status: DC

## 2011-04-23 NOTE — Telephone Encounter (Signed)
Pt has seen her Oncologist recently and he told her to ask her Dr. About having her thyroid checked and the CPE panels checked. She also needs her B/P refilled. Shes uses [facility] on [address]

## 2011-04-23 NOTE — Telephone Encounter (Signed)
Have pt make appt for CPX with fasting labs prior... CMET, lipids and TSH. Okay to refill until appt in 06/2011

## 2011-04-23 NOTE — Telephone Encounter (Signed)
Melissa [name] can you please schedule and send back to me!

## 2011-04-26 ENCOUNTER — Telehealth: Payer: Self-pay | Admitting: Family Medicine

## 2011-04-26 DIAGNOSIS — Z1231 Encounter for screening mammogram for malignant neoplasm of breast: Secondary | ICD-10-CM

## 2011-04-26 NOTE — Telephone Encounter (Signed)
L/M for pt to call back and set up CPE.

## 2011-04-26 NOTE — Telephone Encounter (Signed)
Set pt up in June for her CPE but she is wanting her Mammo before then. She was wondering if she could have the order put in before her CPE date.

## 2011-04-27 NOTE — Telephone Encounter (Signed)
Mammo refrral entered for [facility] where she had it don last time.

## 2011-06-02 ENCOUNTER — Other Ambulatory Visit: Payer: Self-pay | Admitting: *Deleted

## 2011-06-02 DIAGNOSIS — C541 Malignant neoplasm of endometrium: Secondary | ICD-10-CM

## 2011-06-07 ENCOUNTER — Encounter (HOSPITAL_COMMUNITY): Payer: Self-pay

## 2011-06-07 ENCOUNTER — Ambulatory Visit (HOSPITAL_COMMUNITY)
Admission: RE | Admit: 2011-06-07 | Discharge: 2011-06-07 | Disposition: A | Discharge status: Home / Self Care | Payer: Federal, State, Local not specified - PPO | Source: Ambulatory Visit | Attending: Oncology | Admitting: Oncology

## 2011-06-07 ENCOUNTER — Other Ambulatory Visit: Payer: Federal, State, Local not specified - PPO | Admitting: Lab

## 2011-06-07 ENCOUNTER — Other Ambulatory Visit (HOSPITAL_BASED_OUTPATIENT_CLINIC_OR_DEPARTMENT_OTHER): Payer: Federal, State, Local not specified - PPO | Admitting: Lab

## 2011-06-07 ENCOUNTER — Ambulatory Visit: Payer: Self-pay | Admitting: Family Medicine

## 2011-06-07 DIAGNOSIS — C541 Malignant neoplasm of endometrium: Secondary | ICD-10-CM

## 2011-06-07 DIAGNOSIS — M479 Spondylosis, unspecified: Secondary | ICD-10-CM | POA: Insufficient documentation

## 2011-06-07 DIAGNOSIS — D1803 Hemangioma of intra-abdominal structures: Secondary | ICD-10-CM | POA: Insufficient documentation

## 2011-06-07 DIAGNOSIS — C549 Malignant neoplasm of corpus uteri, unspecified: Secondary | ICD-10-CM

## 2011-06-07 DIAGNOSIS — Z90710 Acquired absence of both cervix and uterus: Secondary | ICD-10-CM | POA: Insufficient documentation

## 2011-06-07 LAB — COMPREHENSIVE METABOLIC PANEL
Alkaline Phosphatase: 72 U/L (ref 39–117)
Glucose, Bld: 86 mg/dL (ref 70–99)
Sodium: 137 mEq/L (ref 135–145)
Total Bilirubin: 0.3 mg/dL (ref 0.3–1.2)
Total Protein: 7.1 g/dL (ref 6.0–8.3)

## 2011-06-07 LAB — CBC WITH DIFFERENTIAL/PLATELET
Basophils Absolute: 0 10*3/uL (ref 0.0–0.1)
EOS%: 1.8 % (ref 0.0–7.0)
HGB: 12 g/dL (ref 11.6–15.9)
MCH: 30.4 pg (ref 25.1–34.0)
MCV: 89.9 fL (ref 79.5–101.0)
MONO%: 7.5 % (ref 0.0–14.0)
RBC: 3.96 10*6/uL (ref 3.70–5.45)
RDW: 13.9 % (ref 11.2–14.5)

## 2011-06-07 MED ORDER — IOHEXOL 300 MG/ML  SOLN
100.0000 mL | Freq: Once | INTRAMUSCULAR | Status: AC | PRN
  Administered 2011-06-07: 100 mL via INTRAVENOUS

## 2011-06-08 ENCOUNTER — Encounter: Payer: Self-pay | Admitting: Family Medicine

## 2011-06-10 ENCOUNTER — Ambulatory Visit: Payer: Federal, State, Local not specified - PPO | Admitting: Gynecologic Oncology

## 2011-06-16 ENCOUNTER — Encounter: Payer: Self-pay | Admitting: Gynecologic Oncology

## 2011-06-16 ENCOUNTER — Other Ambulatory Visit (HOSPITAL_COMMUNITY)
Admission: RE | Admit: 2011-06-16 | Discharge: 2011-06-16 | Disposition: A | Discharge status: Home / Self Care | Payer: Federal, State, Local not specified - PPO | Source: Ambulatory Visit | Attending: Gynecologic Oncology | Admitting: Gynecologic Oncology

## 2011-06-16 ENCOUNTER — Encounter: Payer: Self-pay | Admitting: *Deleted

## 2011-06-16 ENCOUNTER — Ambulatory Visit: Payer: Federal, State, Local not specified - PPO | Attending: Gynecologic Oncology | Admitting: Gynecologic Oncology

## 2011-06-16 ENCOUNTER — Other Ambulatory Visit: Payer: Self-pay | Admitting: Gynecologic Oncology

## 2011-06-16 VITALS — BP 122/58 | HR 76 | Temp 98.2°F | Resp 16 | Ht 67.72 in | Wt 195.7 lb

## 2011-06-16 DIAGNOSIS — Z90710 Acquired absence of both cervix and uterus: Secondary | ICD-10-CM | POA: Insufficient documentation

## 2011-06-16 DIAGNOSIS — I10 Essential (primary) hypertension: Secondary | ICD-10-CM | POA: Insufficient documentation

## 2011-06-16 DIAGNOSIS — C549 Malignant neoplasm of corpus uteri, unspecified: Secondary | ICD-10-CM | POA: Insufficient documentation

## 2011-06-16 DIAGNOSIS — C541 Malignant neoplasm of endometrium: Secondary | ICD-10-CM

## 2011-06-16 DIAGNOSIS — Z01419 Encounter for gynecological examination (general) (routine) without abnormal findings: Secondary | ICD-10-CM | POA: Insufficient documentation

## 2011-06-16 NOTE — Progress Notes (Signed)
06/16/11 @0945 , GOG-0258, Six Month Follow-Up Visit:  Melissa [name] into the [facility] to see Dr. Lester Tallaboa for an H&P with pelvic exam.  She had CT scans done last week that showed no evidence of disease.  She is doing well with no complaints. She had a CBC and a CMET done on 06/07/11 that were completely normal.  Her next visit will be in 3 months.

## 2011-06-16 NOTE — Progress Notes (Signed)
Consult Note: Gyn-Onc  [name] 56 y.o. female  CC:  Chief Complaint  Patient presents with  . Endo ca    Follow up    HPI: Melissa [name] presents today with new complaints following completion of GOG 258, for treatment of stage IIIC1 endometrial cancer.   This is a 56 year old, who presented to Dr. [name] with complaints of watery vaginal discharge. Endometrial biopsy demonstrated presence of a grade 2 endometrial cancer. On May 08, 2010, she underwent surgical staging and final pathology noted a grade 3 tumor with 73% myometrial invasion. There was lymphovascular space involvement in 1 of 25 lymph nodes, is noted to be positive. The positive node was within the pelvis. The periaortic lymph nodes were negative. After discussion with the patient regarding her treatment option, she elected to participate in GOG 2008. She has randomized treatment chemotherapy and chemotherapy radiation, followed by chemotherapy. She completed the first 3 cycles of chemotherapy and external beam vaginal cuff brachytherapy. She has subsequently received an additional 3 cycles of Taxol and carboplatin therapy.   Interval History:  I last saw her 2/13 at which time she had no complaints, exam and pap smear were Normal. She comes in today c/o lower back pain and lower abdominal burning for 2 weeks. It started shortly after I saw her. She has had this back pain before. This pain is relieved with NSAIDS. There has been no change in her bowel or bladder habits. She denies any vaginal bleeding. The low back pain does not radiate . She denies any weakness or numbness. This back pain as stated before has occurred previously but has been self-limited. This pain has not there continuously. She does feel like it comes and goes. It does not wake her up at night.  She has some numbness of her left foot and slight on the toes of her right foot.  None in her hands.  PS is 0.  CT revealed: IMPRESSION:  1. No  acute abdominal/pelvic findings, mass lesions or  lymphadenopathy.  2. Stable surgical changes in the pelvis. No findings for  recurrent tumor or adenopathy.  3. Large amount of stool in the colon suggesting constipation.  4. Stable small hepatic hemangiomas.     Review of Systems:  No nausea, vomiting, fever, chills, shortness of breath. She reports residual fatigue. She reports neuropathy of the toes, the left is worse than the right. No bleeding from the rectum, bladder, vagina. Otherwise, 10-point review of systems is negative, and the performance status is 0.   Interval History:   Review of Systems  Current Meds:  Outpatient Encounter Prescriptions as of 06/16/2011  Medication Sig Dispense Refill  . docusate sodium (COLACE) 100 MG capsule Take 100 mg by mouth 2 (two) times daily as needed.       . ramipril (ALTACE) 10 MG capsule Take 1 capsule (10 mg total) by mouth daily.  30 capsule  2  . LORazepam (ATIVAN) 0.5 MG tablet Take 0.5 mg by mouth every 4 (four) hours as needed.        . Meloxicam (MOBIC PO) Take 10 mg by mouth 1 day or 1 dose.        . ondansetron (ZOFRAN) 8 MG tablet Take by mouth as directed.          Allergy: No Known Allergies  Social Hx:   History   Social History  . Marital Status: Married    Spouse Name: N/A    Number of Children: N/A  .  Years of Education: N/A   Occupational History  . Not on file.   Social History Main Topics  . Smoking status: Never Smoker   . Smokeless tobacco: Not on file  . Alcohol Use: Yes     Occas  . Drug Use: Not on file  . Sexually Active: Not on file   Other Topics Concern  . Not on file   Social History Narrative  . No narrative on file    Past Surgical Hx:  Past Surgical History  Procedure Date  . Abdominal hysterectomy 05-08-10    LAPAROSCOPIC ROBOTIC ASSISSTED TOTAL HYSTERECTOMY WITH BSO , BILAT. PELVIC AND PERIAORTIC LYMPH NODE EVAL. BY DR. Duard Brady AT [facility]  . Cesarean section 01/08/80 11/21/82    x2     Past Medical Hx:  Past Medical History  Diagnosis Date  . Hypertension   . Osteoarthritis     HX OF SOME  OSTEOARTHRITIS  . 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 Pancreatic cancer Father   . Diabetes Brother     Vitals:  Blood pressure 122/58, pulse 76, temperature 98.2 F (36.8 C), temperature source Oral, resp. rate 16, height 5' 7.72" (1.72 m), weight 195 lb 11.2 oz (88.769 kg).  Physical Exam: Well-nourished well-developed female in no acute distress.  Neck: Supple no lymphadenopathy no thyromegaly.  Lungs: Clear to auscultation bilaterally.  Cardiovascular: Regular rate and rhythm.  Abdomen: Soft, nontender, nondistended. There are no palpable masses. There are no appreciable hernias  Groins: No lymphadenopathy.  Extremities: No edema.   Pelvic: There are no masses. There is no nodularity. Rectal confirms. The vaginal cuff is without any lesions. ThinPrep Pap smear was admitted without difficulty.    Assessment/Plan: IMPRESSION: [name] has completed definitive treatment for her stage IIIC1 grade 3 endometrial cancer.  She has no evidence of recurrent disease. She completed all of her therapy approximately 6 months ago. CT in February was negative. We will followup in results of her Pap smear from today. Return to see me in 3 months or when necessary.  [name], MD 06/16/2011, 9:53 AM

## 2011-06-16 NOTE — Patient Instructions (Signed)
RTC 3 months

## 2011-06-21 ENCOUNTER — Telehealth: Payer: Self-pay | Admitting: Gynecologic Oncology

## 2011-06-21 NOTE — Telephone Encounter (Signed)
Pt notified about pap results: negative.  No questions or concerns voiced. 

## 2011-07-02 ENCOUNTER — Telehealth: Payer: Self-pay | Admitting: Radiation Oncology

## 2011-07-02 ENCOUNTER — Encounter: Payer: Self-pay | Admitting: Radiation Oncology

## 2011-07-02 NOTE — Telephone Encounter (Signed)
Patient phoned and left a message with Darryl Nestle requesting a nurse phone her reference questions about lubricant. Called patient as requested but, got no answer. Left message. Awaiting return call.

## 2011-07-02 NOTE — Progress Notes (Signed)
1135 Patient and her husband presented to the nurses' station today with questions reference safe vaginal lubricant. Answered all questions. Provided patient with samples of water based astro glide lubricant. Instructed patient to never use Vaseline or lotion. Questioned patient reference dilator. Patient reports using dilator as directed. Encouraged patient to call with any future needs. Patient verbalized understanding of all things discussed.

## 2011-07-24 ENCOUNTER — Other Ambulatory Visit: Payer: Self-pay | Admitting: Family Medicine

## 2011-07-24 DIAGNOSIS — I10 Essential (primary) hypertension: Secondary | ICD-10-CM

## 2011-07-24 DIAGNOSIS — E786 Lipoprotein deficiency: Secondary | ICD-10-CM

## 2011-07-24 NOTE — Telephone Encounter (Signed)
Message copied by Excell Seltzer on Sat Jul 24, 2011  9:10 AM ------      Message from: Alvina Chou      Created: Fri Jul 23, 2011 10:06 AM      Regarding: lab orders for Friday, 6-7       Patient is scheduled for CPX labs, please order future labs, Thanks , Camelia Eng

## 2011-07-30 ENCOUNTER — Ambulatory Visit (INDEPENDENT_AMBULATORY_CARE_PROVIDER_SITE_OTHER): Payer: Federal, State, Local not specified - PPO | Admitting: Family Medicine

## 2011-07-30 ENCOUNTER — Other Ambulatory Visit (INDEPENDENT_AMBULATORY_CARE_PROVIDER_SITE_OTHER): Payer: Federal, State, Local not specified - PPO

## 2011-07-30 ENCOUNTER — Encounter: Payer: Self-pay | Admitting: Family Medicine

## 2011-07-30 VITALS — BP 110/72 | HR 68 | Temp 99.1°F | Ht 66.0 in | Wt 196.0 lb

## 2011-07-30 DIAGNOSIS — E786 Lipoprotein deficiency: Secondary | ICD-10-CM

## 2011-07-30 DIAGNOSIS — I10 Essential (primary) hypertension: Secondary | ICD-10-CM

## 2011-07-30 LAB — COMPREHENSIVE METABOLIC PANEL
Albumin: 3.9 g/dL (ref 3.5–5.2)
Alkaline Phosphatase: 63 U/L (ref 39–117)
BUN: 16 mg/dL (ref 6–23)
CO2: 28 mEq/L (ref 19–32)
Calcium: 9.1 mg/dL (ref 8.4–10.5)
Chloride: 108 mEq/L (ref 96–112)
GFR: 115.88 mL/min (ref >60.00)
Glucose, Bld: 73 mg/dL (ref 70–99)
Potassium: 4.8 mEq/L (ref 3.5–5.1)

## 2011-07-30 LAB — LIPID PANEL
Cholesterol: 170 mg/dL (ref 0–200)
Triglycerides: 69 mg/dL (ref 0.0–149.0)

## 2011-08-03 NOTE — Progress Notes (Signed)
  Subjective:    Patient ID: Melissa [name], female    DOB: 01/06/1956, 56 y.o.   MRN: [id number]  HPI  cancelled  Review of Systems     Objective:   Physical Exam        Assessment & Plan:

## 2011-08-06 ENCOUNTER — Ambulatory Visit (INDEPENDENT_AMBULATORY_CARE_PROVIDER_SITE_OTHER): Payer: Federal, State, Local not specified - PPO | Admitting: Family Medicine

## 2011-08-06 ENCOUNTER — Encounter: Payer: Self-pay | Admitting: Family Medicine

## 2011-08-06 VITALS — BP 120/84 | HR 64 | Temp 98.7°F | Ht 66.0 in | Wt 196.0 lb

## 2011-08-06 DIAGNOSIS — M1710 Unilateral primary osteoarthritis, unspecified knee: Secondary | ICD-10-CM

## 2011-08-06 DIAGNOSIS — IMO0002 Reserved for concepts with insufficient information to code with codable children: Secondary | ICD-10-CM

## 2011-08-06 DIAGNOSIS — I10 Essential (primary) hypertension: Secondary | ICD-10-CM

## 2011-08-06 DIAGNOSIS — E786 Lipoprotein deficiency: Secondary | ICD-10-CM

## 2011-08-06 DIAGNOSIS — C55 Malignant neoplasm of uterus, part unspecified: Secondary | ICD-10-CM

## 2011-08-06 MED ORDER — RAMIPRIL 10 MG PO CAPS: 10.0000 mg | ORAL_CAPSULE | Freq: Every day | ORAL | Status: DC

## 2011-08-06 MED ORDER — MELOXICAM 15 MG PO TABS: 7.5000 mg | ORAL_TABLET | Freq: Every day | ORAL | Status: DC

## 2011-08-06 NOTE — Assessment & Plan Note (Signed)
Stable 6 months, now 9 month s/p completion of therapy.

## 2011-08-06 NOTE — Progress Notes (Signed)
Subjective:    Patient ID: Melissa [name], female    DOB: [birth year], 56 y.o.   MRN: [id number]  HPI  The patient is here for annual wellness exam and preventative care.    Endometrial biopsy demonstrated presence of a grade 2 endometrial cancer. On May 08, 2010, she underwent surgical staging and final pathology noted a grade 3 tumor with 73% myometrial invasion. There was lymphovascular space involvement in 1 of 25 lymph nodes, is noted to be positive. The positive node was within the pelvis. The periaortic lymph nodes were negative. After discussion with the patient regarding her treatment option, she elected to participate in GOG 2008. She has randomized treatment chemotherapy and chemotherapy radiation, followed by chemotherapy. She completed the first 3 cycles of chemotherapy and external beam vaginal cuff brachytherapy. She has subsequently received an additional 3 cycles of Taxol and carboplatin therapy.   Chemo associated neuropathy.  Has had some left calf discomfort x months. Has mild numbness in feet. On B 6 and B12. Not bothering her enough to consider a medication. No swelling. Has varicose veins.   Hypertension:  Well controlled on ramipril.  Using medication without problems or lightheadedness:  None Chest pain with exertion:NOne Edema: None Short of breath:None Average home BPs: At home 110/70. Other issues: Reviewed labs in detail, Cholesterol well controlled and LDL at goal <130.    History   Social History  . Marital Status: Married    Spouse Name: N/A    Number of Children: N/A  . Years of Education: N/A   Social History Main Topics  . Smoking status: Never Smoker   . Smokeless tobacco: Never Used  . Alcohol Use: 0.5 oz/week    1 drink(s) per week     Occas  . Drug Use: No  . Sexually Active: No   Other Topics Concern  . None   Social History Narrative   exercsie at [employer] 4-6 times a week. Diet: good      Review of Systems  Constitutional:  Negative for fever, fatigue and unexpected weight change.  HENT: Negative for ear pain, congestion, sore throat, sneezing, trouble swallowing and sinus pressure.   Eyes: Negative for pain and itching.  Respiratory: Negative for cough, shortness of breath and wheezing.   Cardiovascular: Negative for chest pain, palpitations and leg swelling.  Gastrointestinal: Negative for nausea, abdominal pain, diarrhea, constipation and blood in stool.  Genitourinary: Negative for dysuria, hematuria, vaginal discharge, difficulty urinating and menstrual problem.  Skin: Negative for rash.  Neurological: Negative for syncope, weakness, light-headedness, numbness and headaches.  Psychiatric/Behavioral: Negative for confusion and dysphoric mood. The patient is not nervous/anxious.        Objective:   Physical Exam  Constitutional: Vital signs are normal. She appears well-developed and well-nourished. She is cooperative.  Non-toxic appearance. She does not appear ill. No distress.  HENT:  Head: Normocephalic.  Right Ear: Hearing, tympanic membrane, external ear and ear canal normal.  Left Ear: Hearing, tympanic membrane, external ear and ear canal normal.  Nose: Nose normal.  Eyes: Conjunctivae, EOM and lids are normal. Pupils are equal, round, and reactive to light. No foreign bodies found.  Neck: Trachea normal and normal range of motion. Neck supple. Carotid bruit is not present. No mass and no thyromegaly present.  Cardiovascular: Normal rate, regular rhythm, S1 normal, S2 normal, normal heart sounds and intact distal pulses.  Exam reveals no gallop.   No murmur heard. Pulmonary/Chest: Effort normal and breath sounds normal. No respiratory  distress. She has no wheezes. She has no rhonchi. She has no rales.  Abdominal: Soft. Normal appearance and bowel sounds are normal. She exhibits no distension, no fluid wave, no abdominal bruit and no mass. There is no hepatosplenomegaly. There is no tenderness. There  is no rebound, no guarding and no CVA tenderness. No hernia.  Lymphadenopathy:    She has no cervical adenopathy.    She has no axillary adenopathy.  Neurological: She is alert. She has normal strength. No cranial nerve deficit or sensory deficit.  Skin: Skin is warm, dry and intact. No rash noted.  Psychiatric: Her speech is normal and behavior is normal. Judgment normal. Her mood appears not anxious. Cognition and memory are normal. She does not exhibit a depressed mood.          Assessment & Plan:  The patient's preventative maintenance and recommended screening tests for an annual wellness exam were reviewed in full today. Brought up to date unless services declined.  Counselled on the importance of diet, exercise, and its role in overall health and mortality. The patient's FH and SH was reviewed, including their home life, tobacco status, and drug and alcohol status.   PAP/GYN: hx of endometrial cancer, s/s total abdominal hysterectomy, pap per Gyn onc, this returned nml. Vaccines: uptodate with Td Colon:Date: 11/10/2006... Dr. Russella Dar.  Results: adenoma , no dysplasia, recommended repeat in 5 year.Due in 2013. Mammogram: 05/07/2011 nml

## 2011-08-06 NOTE — Assessment & Plan Note (Signed)
Well controlled. Continue current medication.  

## 2011-08-06 NOTE — Patient Instructions (Signed)
Elevate feet. Consider compression hose for varicose veins. Exercise ans weight loss. Due for colonoscopy at end of year, after 10/2011. Follow up in 1 year for CPX. Call sooner for appt if leg pain not improving.

## 2011-08-06 NOTE — Assessment & Plan Note (Signed)
Resolved with exercise

## 2011-08-06 NOTE — Assessment & Plan Note (Signed)
Continue tramadol sparingly for pain.

## 2011-09-08 ENCOUNTER — Encounter: Payer: Self-pay | Admitting: Gynecologic Oncology

## 2011-09-08 ENCOUNTER — Other Ambulatory Visit (HOSPITAL_COMMUNITY)
Admission: RE | Admit: 2011-09-08 | Discharge: 2011-09-08 | Disposition: A | Discharge status: Home / Self Care | Payer: Federal, State, Local not specified - PPO | Source: Ambulatory Visit | Attending: Gynecologic Oncology | Admitting: Gynecologic Oncology

## 2011-09-08 ENCOUNTER — Ambulatory Visit: Payer: Federal, State, Local not specified - PPO | Attending: Gynecologic Oncology | Admitting: Gynecologic Oncology

## 2011-09-08 VITALS — BP 110/68 | HR 68 | Temp 98.3°F | Resp 16 | Ht 67.0 in | Wt 194.2 lb

## 2011-09-08 DIAGNOSIS — Z124 Encounter for screening for malignant neoplasm of cervix: Secondary | ICD-10-CM | POA: Insufficient documentation

## 2011-09-08 DIAGNOSIS — Z01419 Encounter for gynecological examination (general) (routine) without abnormal findings: Secondary | ICD-10-CM | POA: Insufficient documentation

## 2011-09-08 DIAGNOSIS — Z833 Family history of diabetes mellitus: Secondary | ICD-10-CM | POA: Insufficient documentation

## 2011-09-08 DIAGNOSIS — Z800 Family history of malignant neoplasm of digestive organs: Secondary | ICD-10-CM | POA: Insufficient documentation

## 2011-09-08 DIAGNOSIS — G5790 Unspecified mononeuropathy of unspecified lower limb: Secondary | ICD-10-CM | POA: Insufficient documentation

## 2011-09-08 DIAGNOSIS — M1990 Unspecified osteoarthritis, unspecified site: Secondary | ICD-10-CM | POA: Insufficient documentation

## 2011-09-08 DIAGNOSIS — C541 Malignant neoplasm of endometrium: Secondary | ICD-10-CM

## 2011-09-08 DIAGNOSIS — I10 Essential (primary) hypertension: Secondary | ICD-10-CM | POA: Insufficient documentation

## 2011-09-08 DIAGNOSIS — Z8542 Personal history of malignant neoplasm of other parts of uterus: Secondary | ICD-10-CM | POA: Insufficient documentation

## 2011-09-08 DIAGNOSIS — Z923 Personal history of irradiation: Secondary | ICD-10-CM | POA: Insufficient documentation

## 2011-09-08 DIAGNOSIS — Z9221 Personal history of antineoplastic chemotherapy: Secondary | ICD-10-CM | POA: Insufficient documentation

## 2011-09-08 DIAGNOSIS — Z90710 Acquired absence of both cervix and uterus: Secondary | ICD-10-CM | POA: Insufficient documentation

## 2011-09-08 NOTE — Addendum Note (Signed)
Addended by: Randel Pigg on: 09/08/2011 09:31 AM   Modules accepted: Orders

## 2011-09-08 NOTE — Progress Notes (Signed)
Consult Note: Gyn-Onc  [name] 56 y.o. female  CC:  Chief Complaint  Patient presents with  . Endometrial cancer    HPI: HPI: [name] presents today with new complaints following completion of GOG 258, for treatment of stage IIIC1 endometrial cancer.   This is a 56 year old, who presented to Dr. [name] with complaints of watery vaginal discharge. Endometrial biopsy demonstrated presence of a grade 2 endometrial cancer. On May 08, 2010, she underwent surgical staging and final pathology noted a grade 3 tumor with 73% myometrial invasion. There was lymphovascular space involvement in 1 of 25 lymph nodes, is noted to be positive. The positive node was within the pelvis. The periaortic lymph nodes were negative. After discussion with the patient regarding her treatment option, she elected to participate in GOG 2008. She has randomized treatment chemotherapy and chemotherapy radiation, followed by chemotherapy. She completed the first 3 cycles of chemotherapy and external beam vaginal cuff brachytherapy. She has subsequently received an additional 3 cycles of Taxol and carboplatin therapy.   Interval History:  There has been no change in her bowel or bladder habits. She denies any vaginal bleeding. The low back pain does not radiate . She denies any weakness or numbness. This back pain as stated before has occurred previously but has been self-limited. This pain has not there continuously. She does feel like it comes and goes. It does not wake her up at night. She has no neuropathy. None in her hands. PS is 0.   She had a protocol required CT scan 4/13 that was negative for any evidence of disease.  Review of Systems:  No nausea, vomiting, fever, chills, shortness of breath. She reports residual fatigue. She reports neuropathy of the toes, the left is worse than the right. No bleeding from the rectum, bladder, vagina. She does complain of some right mid abdominal pain  that seems to be worse when she is hungry, when she twists a certain way or when she wears a particular pair of pants.  She denies any n/v/f/c/cp/sob. Otherwise, 10-point review of systems is negative, and the performance status is 0. She is up to date on her MMG.  There is no change in her family history.   Current Meds:  Outpatient Encounter Prescriptions as of 09/08/2011  Medication Sig Dispense Refill  . docusate sodium (COLACE) 100 MG capsule Take 100 mg by mouth 2 (two) times daily as needed.       . meloxicam (MOBIC) 15 MG tablet Take 0.5 tablets (7.5 mg total) by mouth daily.  30 tablet  1  . ramipril (ALTACE) 10 MG capsule Take 1 capsule (10 mg total) by mouth daily.  90 capsule  3    Allergy: No Known Allergies  Social Hx:   History   Social History  . Marital Status: Married    Spouse Name: N/A    Number of Children: N/A  . Years of Education: N/A   Occupational History  . Not on file.   Social History Main Topics  . Smoking status: Never Smoker   . Smokeless tobacco: Never Used  . Alcohol Use: 0.5 oz/week    1 drink(s) per week     Occas  . Drug Use: No  . Sexually Active: No   Other Topics Concern  . Not on file   Social History Narrative   exercsie at [facility] 4-6 times a week. Diet: good    Past Surgical Hx:  Past Surgical History  Procedure Date  .  Abdominal hysterectomy 05-08-10    LAPAROSCOPIC ROBOTIC ASSISSTED TOTAL HYSTERECTOMY WITH BSO , BILAT. PELVIC AND PERIAORTIC LYMPH NODE EVAL. BY DR. Duard Brady AT [facility] [place]  . Cesarean section 01/08/80 11/21/82    x2    Past Medical Hx:  Past Medical History  Diagnosis Date  . Hypertension   . Osteoarthritis     HX OF SOME  OSTEOARTHRITIS  . 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 Pancreatic cancer Father   . Diabetes Brother     Vitals:  Blood pressure 110/68, pulse 68, temperature 98.3  F (36.8 C), temperature source Oral, resp. rate 16, height 5\' 7"  (1.702 m), weight 194 lb 3.2 oz (88.089 kg).  Physical Exam: Well-nourished well-developed female in no acute distress.  Neck: Supple no lymphadenopathy no thyromegaly.  Lungs: Clear to auscultation bilaterally.  Cardiovascular: Regular rate and rhythm.  Abdomen: Soft, nontender, nondistended. There are no palpable masses. There are no appreciable hernias  Groins: No lymphadenopathy.  Extremities: No edema.  Pelvic: There are no masses. There is no nodularity. Rectal confirms. The vaginal cuff is without any lesions. ThinPrep Pap smear was admitted without difficulty.   Assessment/Plan:  IMPRESSION: Ms. Carte has completed definitive treatment for her stage IIIC1 grade 3 endometrial cancer.  She has no evidence of recurrent disease. She completed all of her therapy approximately 6 months ago. CT in February was negative which was done secondary to pain and protocol CT in 4/13 was also negative.  She will keep track of her mid abdominal pain and notify us if different or more severe. We will followup in results of her Pap smear from today. Return to see me in 3 months or when necessary.  She does not require any lab work today.      Melissa [name] A., MD 09/08/2011, 9:17 AM

## 2011-09-08 NOTE — Patient Instructions (Signed)
RTC 3 months

## 2011-09-09 ENCOUNTER — Other Ambulatory Visit: Payer: Self-pay | Admitting: *Deleted

## 2011-09-09 DIAGNOSIS — C541 Malignant neoplasm of endometrium: Secondary | ICD-10-CM

## 2011-09-10 ENCOUNTER — Telehealth: Payer: Self-pay | Admitting: Oncology

## 2011-09-10 NOTE — Telephone Encounter (Signed)
lmonvm for pt re appts for lb/ct and cxr 10/28. gv d/t/locatio/instructions and mailed schedule/referral today.

## 2011-09-14 ENCOUNTER — Telehealth: Payer: Self-pay | Admitting: Gynecologic Oncology

## 2011-09-14 NOTE — Telephone Encounter (Signed)
Pt notified about pap results: negative.  No questions or concerns voiced. 

## 2011-09-15 ENCOUNTER — Encounter: Payer: Self-pay | Admitting: Gastroenterology

## 2011-09-22 ENCOUNTER — Encounter: Payer: Self-pay | Admitting: Gastroenterology

## 2011-10-29 ENCOUNTER — Ambulatory Visit (AMBULATORY_SURGERY_CENTER): Payer: Federal, State, Local not specified - PPO

## 2011-10-29 VITALS — Ht 67.0 in | Wt 199.0 lb

## 2011-10-29 DIAGNOSIS — Z1211 Encounter for screening for malignant neoplasm of colon: Secondary | ICD-10-CM

## 2011-10-29 MED ORDER — MOVIPREP 100 G PO SOLR: 1.0000 | Freq: Once | ORAL | Status: DC

## 2011-11-01 ENCOUNTER — Encounter: Payer: Self-pay | Admitting: Gastroenterology

## 2011-11-05 ENCOUNTER — Ambulatory Visit (AMBULATORY_SURGERY_CENTER): Payer: Federal, State, Local not specified - PPO | Admitting: Gastroenterology

## 2011-11-05 ENCOUNTER — Encounter: Payer: Self-pay | Admitting: Gastroenterology

## 2011-11-05 VITALS — BP 132/73 | HR 56 | Temp 97.3°F | Resp 18 | Ht 67.0 in | Wt 199.0 lb

## 2011-11-05 DIAGNOSIS — Z1211 Encounter for screening for malignant neoplasm of colon: Secondary | ICD-10-CM

## 2011-11-05 DIAGNOSIS — Z86010 Personal history of colonic polyps: Secondary | ICD-10-CM

## 2011-11-05 MED ORDER — SODIUM CHLORIDE 0.9 % IV SOLN: 500.0000 mL | INTRAVENOUS | Status: DC

## 2011-11-05 NOTE — Patient Instructions (Addendum)

## 2011-11-05 NOTE — Progress Notes (Signed)
Patient did not experience any of the following events: a burn prior to discharge; a fall within the facility; wrong site/side/patient/procedure/implant event; or a hospital transfer or hospital admission upon discharge from the facility. (G8907) Patient did not have preoperative order for IV antibiotic SSI prophylaxis. (G8918)  

## 2011-11-05 NOTE — Progress Notes (Signed)
Pressure applied to the abdomen to reach the cecum 

## 2011-11-05 NOTE — Op Note (Signed)
[facility] [address] [place], [address]   COLONOSCOPY PROCEDURE REPORT  PATIENT: Melissa [name], Melissa [name]  MR#: [id number] BIRTHDATE: 1955/12/07 , 56  yrs. old GENDER: Female ENDOSCOPIST: Meryl Dare, MD, [facility]  PROCEDURE DATE:  11/05/2011 PROCEDURE:   Colonoscopy, diagnostic ASA CLASS:   Class II INDICATIONS:patient's personal history of adenomatous colon polyps, 10/2006, and last colonoscopy performed 5 years ago. MEDICATIONS: MAC sedation, administered by CRNA and propofol (Diprivan) 220mg  IV DESCRIPTION OF PROCEDURE:   After the risks benefits and alternatives of the procedure were thoroughly explained, informed consent was obtained.  A digital rectal exam revealed small external hemorrhoids.   The LB CF-[id number]  endoscope was introduced through the anus and advanced to the cecum, which was identified by both the appendix and ileocecal valve. No adverse events experienced with a tortuous and redundant colon.   The quality of the prep was adequate, using MoviPrep  The instrument was then slowly withdrawn as the colon was fully examined.    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   The colon was otherwise normal.  There was no diverticulosis, inflammation, polyps or cancers unless previously stated.  Retroflexed views revealed small internal hemorrhoids. The time to cecum=3 minutes 17 seconds.  Withdrawal time=10 minutes 14 seconds.  The scope was withdrawn and the procedure completed. COMPLICATIONS: There were no complications.  ENDOSCOPIC IMPRESSION: 1.   Radiation proctitis 2.   Internal and external hemorrhoids  RECOMMENDATIONS: 1.   repeat Colonoscopy in 5 years.   eSigned:  Meryl Dare, MD, [facility] 11/05/2011 8:53 AM

## 2011-11-08 ENCOUNTER — Telehealth: Payer: Self-pay | Admitting: *Deleted

## 2011-11-08 NOTE — Telephone Encounter (Signed)
  Follow up Call-  Call back number 11/05/2011  Post procedure Call Back phone  # [phone]  Permission to leave phone message Yes     Patient questions:  Do you have a fever, pain , or abdominal swelling? no Pain Score  0 *  Have you tolerated food without any problems? yes  Have you been able to return to your normal activities? yes  Do you have any questions about your discharge instructions: Diet   yes Medications  yes Follow up visit  yes  Do you have questions or concerns about your Care? no  Actions: * If pain score is 4 or above: No action needed, pain <4.

## 2011-12-20 ENCOUNTER — Ambulatory Visit (HOSPITAL_COMMUNITY)
Admission: RE | Admit: 2011-12-20 | Discharge: 2011-12-20 | Disposition: A | Discharge status: Home / Self Care | Payer: Federal, State, Local not specified - PPO | Source: Ambulatory Visit | Attending: Oncology | Admitting: Oncology

## 2011-12-20 ENCOUNTER — Other Ambulatory Visit (HOSPITAL_BASED_OUTPATIENT_CLINIC_OR_DEPARTMENT_OTHER): Payer: Federal, State, Local not specified - PPO | Admitting: Lab

## 2011-12-20 DIAGNOSIS — C541 Malignant neoplasm of endometrium: Secondary | ICD-10-CM

## 2011-12-20 DIAGNOSIS — Z9079 Acquired absence of other genital organ(s): Secondary | ICD-10-CM | POA: Insufficient documentation

## 2011-12-20 DIAGNOSIS — K7689 Other specified diseases of liver: Secondary | ICD-10-CM | POA: Insufficient documentation

## 2011-12-20 DIAGNOSIS — C549 Malignant neoplasm of corpus uteri, unspecified: Secondary | ICD-10-CM | POA: Insufficient documentation

## 2011-12-20 DIAGNOSIS — I10 Essential (primary) hypertension: Secondary | ICD-10-CM | POA: Insufficient documentation

## 2011-12-20 DIAGNOSIS — Z90710 Acquired absence of both cervix and uterus: Secondary | ICD-10-CM | POA: Insufficient documentation

## 2011-12-20 LAB — CBC WITH DIFFERENTIAL/PLATELET
BASO%: 0.4 % (ref 0.0–2.0)
LYMPH%: 20.9 % (ref 14.0–49.7)
MCHC: 33.6 g/dL (ref 31.5–36.0)
MONO#: 0.5 10*3/uL (ref 0.1–0.9)
MONO%: 7 % (ref 0.0–14.0)
Platelets: 244 10*3/uL (ref 145–400)
RBC: 4.34 10*6/uL (ref 3.70–5.45)
RDW: 13.1 % (ref 11.2–14.5)
WBC: 7.6 10*3/uL (ref 3.9–10.3)

## 2011-12-20 LAB — COMPREHENSIVE METABOLIC PANEL (CC13)
AST: 16 U/L (ref 5–34)
Albumin: 4.3 g/dL (ref 3.5–5.0)
Alkaline Phosphatase: 79 U/L (ref 40–150)
BUN: 15 mg/dL (ref 7.0–26.0)
Calcium: 10.1 mg/dL (ref 8.4–10.4)
Chloride: 106 mEq/L (ref 98–107)
Creatinine: 0.8 mg/dL (ref 0.6–1.1)
Glucose: 96 mg/dl (ref 70–99)
Potassium: 4.9 mEq/L (ref 3.5–5.1)

## 2011-12-20 MED ORDER — IOHEXOL 300 MG/ML  SOLN
100.0000 mL | Freq: Once | INTRAMUSCULAR | Status: AC | PRN
  Administered 2011-12-20: 100 mL via INTRAVENOUS

## 2011-12-23 ENCOUNTER — Ambulatory Visit: Payer: Federal, State, Local not specified - PPO | Attending: Gynecologic Oncology | Admitting: Gynecologic Oncology

## 2011-12-23 ENCOUNTER — Other Ambulatory Visit (HOSPITAL_COMMUNITY)
Admission: RE | Admit: 2011-12-23 | Discharge: 2011-12-23 | Disposition: A | Discharge status: Home / Self Care | Payer: Federal, State, Local not specified - PPO | Source: Ambulatory Visit | Attending: Gynecologic Oncology | Admitting: Gynecologic Oncology

## 2011-12-23 ENCOUNTER — Encounter: Payer: Self-pay | Admitting: Gynecologic Oncology

## 2011-12-23 VITALS — BP 118/78 | HR 64 | Temp 98.5°F | Resp 16 | Ht 67.72 in | Wt 196.4 lb

## 2011-12-23 DIAGNOSIS — C541 Malignant neoplasm of endometrium: Secondary | ICD-10-CM

## 2011-12-23 DIAGNOSIS — Z01419 Encounter for gynecological examination (general) (routine) without abnormal findings: Secondary | ICD-10-CM | POA: Insufficient documentation

## 2011-12-23 DIAGNOSIS — I10 Essential (primary) hypertension: Secondary | ICD-10-CM | POA: Insufficient documentation

## 2011-12-23 DIAGNOSIS — C549 Malignant neoplasm of corpus uteri, unspecified: Secondary | ICD-10-CM | POA: Insufficient documentation

## 2011-12-23 DIAGNOSIS — Z09 Encounter for follow-up examination after completed treatment for conditions other than malignant neoplasm: Secondary | ICD-10-CM | POA: Insufficient documentation

## 2011-12-23 NOTE — Patient Instructions (Signed)
RTC 3 months

## 2011-12-23 NOTE — Progress Notes (Signed)
Consult Note: Gyn-Onc  Melissa [name] 56 y.o. female  CC:  Chief Complaint  Patient presents with  . Endo ca    Follow up    HPI: [name] presents today with new complaints following completion of GOG 258, for treatment of stage IIIC1 endometrial cancer.  This is a 56 year old, who presented to Dr. Macon [name] with complaints of watery vaginal discharge. Endometrial biopsy demonstrated presence of a grade 2 endometrial cancer. On May 08, 2010, she underwent surgical staging and final pathology noted a grade 3 tumor with 73% myometrial invasion. There was lymphovascular space involvement in 1 of 25 lymph nodes, is noted to be positive. The positive node was within the pelvis. The periaortic lymph nodes were negative. After discussion with the patient regarding her treatment option, she elected to participate in GOG 2008. She has randomized treatment chemotherapy and chemotherapy radiation, followed by chemotherapy. She completed the first 3 cycles of chemotherapy and external beam vaginal cuff brachytherapy. She has subsequently received an additional 3 cycles of Taxol and carboplatin therapy.   Interval History:  There has been no change in her bowel or bladder habits. She denies any vaginal bleeding. The low back pain does not radiate . She denies any weakness or numbness. This back pain as stated before has occurred previously but has been self-limited. This pain has not there continuously. She does feel like it comes and goes. It does not wake her up at night. She has no neuropathy. None in her hands. PS is 0.  She had a protocol required CT scan 4/13 and 10/13  that were negative for any evidence of disease. 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  Review of Systems:  No nausea, vomiting, fever, chills, shortness of breath. She reports no residual fatigue. She reports  neuropathy of the toes, the left is worse than the right but is is resolving. No bleeding from the rectum, bladder, vagina. She does complain of some right mid abdominal pain that seems to be worse when she is hungry, when she twists a certain way or when she wears a particular pair of pants. She denies any n/v/f/c/cp/sob. Otherwise, 10-point review of systems is negative, and the performance status is 0. She feels some congestion and like there is phlegm in her throat that she cannot cough up. No blood. She is prone to allergies but has not tried any allergy medication. There is no change in her family history.     Current Meds:  Outpatient Encounter Prescriptions as of 12/23/2011  Medication Sig Dispense Refill  . docusate sodium (COLACE) 100 MG capsule Take 100 mg by mouth 2 (two) times daily as needed.       . meloxicam (MOBIC) 15 MG tablet Take 0.5 tablets (7.5 mg total) by mouth daily.  30 tablet  1  . ramipril (ALTACE) 10 MG capsule Take 1 capsule (10 mg total) by mouth daily.  90 capsule  3    Allergy: No Known Allergies  Social Hx:   History   Social History  . Marital Status: Married    Spouse Name: N/A    Number of Children: N/A  . Years of Education: N/A   Occupational History  . Not on file.   Social History Main Topics  . Smoking status: Never Smoker   . Smokeless tobacco: Never Used  . Alcohol Use: 0.5 oz/week  1 drink(s) per week     Occas  . Drug Use: No  . Sexually Active: No   Other Topics Concern  . Not on file   Social History Narrative   exercsie at [facility] 4-6 times a week. Diet: good    Past Surgical Hx:  Past Surgical History  Procedure Date  . Abdominal hysterectomy 05-08-10    LAPAROSCOPIC ROBOTIC ASSISSTED TOTAL HYSTERECTOMY WITH BSO , BILAT. PELVIC AND PERIAORTIC LYMPH NODE EVAL. BY DR. Duard Brady AT [facility]  . Cesarean section 01/08/80 11/21/82    x2    Past Medical Hx:  Past Medical History  Diagnosis Date  . Hypertension   . Osteoarthritis      HX OF SOME  OSTEOARTHRITIS  . 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 Pancreatic cancer Father   . Diabetes Brother   . Colon cancer Maternal Grandfather     Vitals:  Blood pressure 118/78, pulse 64, temperature 98.5 F (36.9 C), resp. rate 16, height 5' 7.72" (1.72 m), weight 196 lb 6.4 oz (89.086 kg).  Physical Exam: Well-nourished well-developed female in no acute distress.   Neck: Supple no lymphadenopathy no thyromegaly.   Lungs: Clear to auscultation bilaterally.   Cardiovascular: Regular rate and rhythm.   Abdomen: Soft, nontender, nondistended. There are no palpable masses. There are no appreciable hernias   Groins: No lymphadenopathy.   Extremities: No edema.   Pelvic: There are no masses. There is no nodularity. Rectal confirms. The vaginal cuff is without any lesions. ThinPrep Pap smear was admitted without difficulty.   Assessment/Plan:  IMPRESSION: Melissa [name] has completed definitive treatment for her stage IIIC1 grade 3 endometrial cancer.  She has no evidence of recurrent disease. She completed all of her therapy approximately 12 months ago. CT in February was negative which was done secondary to pain and protocol CT in 4/13 and 10/13 were also negative. We will followup in results of her Pap smear from today. Return to see me in 3 months or when necessary. Her protocol directed CBC and CHM 10 were normal.      Melissa [name] A., MD 12/23/2011, 10:36 AM

## 2011-12-27 ENCOUNTER — Telehealth: Payer: Self-pay | Admitting: *Deleted

## 2011-12-27 NOTE — Telephone Encounter (Signed)
Patient informed of PAP results  

## 2012-02-24 ENCOUNTER — Other Ambulatory Visit (HOSPITAL_COMMUNITY)
Admission: RE | Admit: 2012-02-24 | Discharge: 2012-02-24 | Disposition: A | Discharge status: Home / Self Care | Payer: Federal, State, Local not specified - PPO | Source: Ambulatory Visit | Attending: Gynecologic Oncology | Admitting: Gynecologic Oncology

## 2012-02-24 ENCOUNTER — Encounter: Payer: Self-pay | Admitting: Gynecologic Oncology

## 2012-02-24 ENCOUNTER — Ambulatory Visit: Payer: Federal, State, Local not specified - PPO | Attending: Gynecologic Oncology | Admitting: Gynecologic Oncology

## 2012-02-24 VITALS — BP 118/78 | HR 80 | Temp 98.4°F | Resp 16 | Ht 67.72 in | Wt 197.8 lb

## 2012-02-24 DIAGNOSIS — C541 Malignant neoplasm of endometrium: Secondary | ICD-10-CM

## 2012-02-24 DIAGNOSIS — Z01419 Encounter for gynecological examination (general) (routine) without abnormal findings: Secondary | ICD-10-CM | POA: Insufficient documentation

## 2012-02-24 DIAGNOSIS — C549 Malignant neoplasm of corpus uteri, unspecified: Secondary | ICD-10-CM | POA: Insufficient documentation

## 2012-02-24 NOTE — Patient Instructions (Signed)
Follow up in 3 months

## 2012-02-24 NOTE — Progress Notes (Signed)
Consult Note: Gyn-Onc  Melissa [name] 57 y.o. female  CC:  Chief Complaint  Patient presents with  . Endo ca    Follow up    HPI: Melissa [name] presents today with new complaints following completion of GOG 258, for treatment of stage IIIC1 endometrial cancer.  This is a 57 year old, who presented to Dr. [name] with complaints of watery vaginal discharge. Endometrial biopsy demonstrated presence of a grade 2 endometrial cancer. On May 08, 2010, she underwent surgical staging and final pathology noted a grade 3 tumor with 73% myometrial invasion. There was lymphovascular space involvement in 1 of 25 lymph nodes, is noted to be positive. The positive node was within the pelvis. The periaortic lymph nodes were negative. After discussion with the patient regarding her treatment option, she elected to participate in GOG 2008. She has randomized treatment chemotherapy and chemotherapy radiation, followed by chemotherapy. She completed the first 3 cycles of chemotherapy and external beam vaginal cuff brachytherapy. She has subsequently received an additional 3 cycles of Taxol and carboplatin therapy.  I last saw her on October 31 which time her exam and Pap smear were negative.  Interval History:  There has been no change in her bowel or bladder habits. . The low back pain does not radiate . She denies any weakness or numbness. This back pain as stated before has occurred previously but has been self-limited. This pain has not there continuously. She does feel like it comes and goes. It does not wake her up at night. She has no neuropathy. None in her hands. PS is 0.  She had a protocol required CT scan 4/13 and 10/13 that were negative for any evidence of disease. 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 Starting just before Christmas when she used her vaginal dilator, she would notice a  bit of pink discharge. The dilator use was not any more painful and she did not use it with any more frequently than usual. She would have a bloody discharge but only after using her dilator. She did not use her dilator during the Christmas holiday and she had no bloody discharge. She then again use her dilator Monday and  began having some spotting and pink discharge with wiping. There is no pain associated with it and  no change in her bowel or bladder habits. No abdominal pain. She has no fevers but she has had some chills but she relates that to the weather.  Review of Systems:  No nausea, vomiting, fever, chills, shortness of breath. She reports no residual fatigue. She reports neuropathy of the toes, the left is worse than the right but is is resolving. No bleeding from the rectum, bladder. She does complain of some right mid abdominal pain that seems to be worse when she is hungry, when she twists a certain way or when she wears a particular pair of pants. She denies any n/v/f/c/cp/sob. Otherwise, 10-point review of systems is negative, and the performance status is 0. There is no change in her family history.    Current Meds:  Outpatient Encounter Prescriptions as of 02/24/2012  Medication Sig Dispense Refill  . docusate sodium (COLACE) 100 MG capsule Take 100 mg by mouth 2 (two) times daily as needed.       . ramipril (ALTACE) 10 MG capsule Take 1 capsule (10 mg total) by mouth daily.  90 capsule  3  . meloxicam (MOBIC) 15 MG tablet Take 0.5 tablets (7.5 mg total) by mouth daily.  30 tablet  1    Allergy: No Known Allergies  Social Hx:   History   Social History  . Marital Status: Married    Spouse Name: N/A    Number of Children: N/A  . Years of Education: N/A   Occupational History  . Not on file.   Social History Main Topics  . Smoking status: Never Smoker   . Smokeless tobacco: Never Used  . Alcohol Use: 0.5 oz/week    1 drink(s) per week     Comment: Occas  . Drug Use:  No  . Sexually Active: No   Other Topics Concern  . Not on file   Social History Narrative   exercsie at [facility] 4-6 times a week. Diet: good    Past Surgical Hx:  Past Surgical History  Procedure Date  . Abdominal hysterectomy 05-08-10    LAPAROSCOPIC ROBOTIC ASSISSTED TOTAL HYSTERECTOMY WITH BSO , BILAT. PELVIC AND PERIAORTIC LYMPH NODE EVAL. BY DR. Duard Brady AT [facility]  . Cesarean section 01/08/80 11/21/82    x2    Past Medical Hx:  Past Medical History  Diagnosis Date  . Hypertension   . Osteoarthritis     HX OF SOME  OSTEOARTHRITIS  . 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 Pancreatic cancer Father   . Diabetes Brother   . Colon cancer Maternal Grandfather     Vitals:  Blood pressure 118/78, pulse 80, temperature 98.4 F (36.9 C), resp. rate 16, height 5' 7.72" (1.72 m), weight 197 lb 12.8 oz (89.721 kg).  Physical Exam: Well-nourished vulva female in no acute distress.  Pelvic: Normal external female genitalia. Vagina is markedly atrophic. There are postradiation changes at the top of the vagina. There is no gross visible lesions. Pap smear was submitted. There was some small amount of bleeding as a Pap smear was obtained. Bimanual examination reveals no masses or nodularity. Rectal confirms.  Assessment/Plan: 57 year old with stage IIIc 1 endometrial carcinoma diagnosed in March of 2012 was completed all treatment. She completed all of her therapy in the spring of 2013 has been free of disease since that time. We'll followup in results for Pap smear from today. I believe that the bleeding is normal status post radiation with the use of the dilators. Do not see any evidence of recurrent disease. She is very reassuring. Return to see me in 3 months and again we will notify her of the results of her Pap smear.  Jobie Popp A., MD 02/24/2012, 3:54 PM

## 2012-03-01 ENCOUNTER — Other Ambulatory Visit: Payer: Self-pay | Admitting: *Deleted

## 2012-03-01 DIAGNOSIS — C55 Malignant neoplasm of uterus, part unspecified: Secondary | ICD-10-CM

## 2012-03-02 ENCOUNTER — Ambulatory Visit: Payer: Federal, State, Local not specified - PPO | Admitting: Gynecologic Oncology

## 2012-03-07 ENCOUNTER — Telehealth: Payer: Self-pay | Admitting: Oncology

## 2012-03-07 NOTE — Telephone Encounter (Signed)
Cxr/ct already on schedule. No other orders. Ct/cxr per 1/8 pof.

## 2012-03-20 ENCOUNTER — Telehealth: Payer: Self-pay | Admitting: Gynecologic Oncology

## 2012-03-20 NOTE — Telephone Encounter (Signed)
Pt notified about pap results: negative.  No questions or concerns voiced.  Instructed to call for any needs.

## 2012-04-08 ENCOUNTER — Other Ambulatory Visit: Payer: Self-pay

## 2012-04-18 ENCOUNTER — Encounter: Payer: Self-pay | Admitting: Gynecologic Oncology

## 2012-05-29 ENCOUNTER — Ambulatory Visit (HOSPITAL_COMMUNITY)
Admission: RE | Admit: 2012-05-29 | Discharge: 2012-05-29 | Disposition: A | Discharge status: Home / Self Care | Payer: Federal, State, Local not specified - PPO | Source: Ambulatory Visit | Attending: Oncology | Admitting: Oncology

## 2012-05-29 ENCOUNTER — Telehealth: Payer: Self-pay | Admitting: *Deleted

## 2012-05-29 ENCOUNTER — Encounter (HOSPITAL_COMMUNITY): Payer: Self-pay

## 2012-05-29 DIAGNOSIS — D1803 Hemangioma of intra-abdominal structures: Secondary | ICD-10-CM | POA: Insufficient documentation

## 2012-05-29 DIAGNOSIS — C549 Malignant neoplasm of corpus uteri, unspecified: Secondary | ICD-10-CM | POA: Insufficient documentation

## 2012-05-29 DIAGNOSIS — Z923 Personal history of irradiation: Secondary | ICD-10-CM | POA: Insufficient documentation

## 2012-05-29 DIAGNOSIS — Z9079 Acquired absence of other genital organ(s): Secondary | ICD-10-CM | POA: Insufficient documentation

## 2012-05-29 DIAGNOSIS — C55 Malignant neoplasm of uterus, part unspecified: Secondary | ICD-10-CM

## 2012-05-29 DIAGNOSIS — Z90710 Acquired absence of both cervix and uterus: Secondary | ICD-10-CM | POA: Insufficient documentation

## 2012-05-29 DIAGNOSIS — Z9221 Personal history of antineoplastic chemotherapy: Secondary | ICD-10-CM | POA: Insufficient documentation

## 2012-05-29 MED ORDER — IOHEXOL 300 MG/ML  SOLN
100.0000 mL | Freq: Once | INTRAMUSCULAR | Status: AC | PRN
  Administered 2012-05-29: 100 mL via INTRAVENOUS

## 2012-05-29 NOTE — Telephone Encounter (Signed)
Pt notified of CT resluts

## 2012-06-01 ENCOUNTER — Encounter: Payer: Self-pay | Admitting: Gynecologic Oncology

## 2012-06-01 ENCOUNTER — Other Ambulatory Visit (HOSPITAL_COMMUNITY)
Admission: RE | Admit: 2012-06-01 | Discharge: 2012-06-01 | Disposition: A | Discharge status: Home / Self Care | Payer: Federal, State, Local not specified - PPO | Source: Ambulatory Visit | Attending: Gynecologic Oncology | Admitting: Gynecologic Oncology

## 2012-06-01 ENCOUNTER — Ambulatory Visit: Payer: Federal, State, Local not specified - PPO | Attending: Gynecologic Oncology | Admitting: Gynecologic Oncology

## 2012-06-01 VITALS — BP 118/70 | HR 68 | Temp 98.6°F | Resp 16 | Ht 67.72 in | Wt 199.5 lb

## 2012-06-01 DIAGNOSIS — N898 Other specified noninflammatory disorders of vagina: Secondary | ICD-10-CM | POA: Insufficient documentation

## 2012-06-01 DIAGNOSIS — C549 Malignant neoplasm of corpus uteri, unspecified: Secondary | ICD-10-CM | POA: Insufficient documentation

## 2012-06-01 DIAGNOSIS — Z01419 Encounter for gynecological examination (general) (routine) without abnormal findings: Secondary | ICD-10-CM | POA: Insufficient documentation

## 2012-06-01 DIAGNOSIS — N952 Postmenopausal atrophic vaginitis: Secondary | ICD-10-CM | POA: Insufficient documentation

## 2012-06-01 DIAGNOSIS — C541 Malignant neoplasm of endometrium: Secondary | ICD-10-CM

## 2012-06-01 NOTE — Progress Notes (Signed)
Consult Note: Gyn-Onc  Melissa [name] 57 y.o. female  CC:  Chief Complaint  Patient presents with  . Endo ca    Follow up    HPI: Melissa [name] presents today with new complaints following completion of GOG 258, for treatment of stage IIIC1 endometrial cancer.  This is a 57 year old, who presented to Dr. [name] with complaints of watery vaginal discharge. Endometrial biopsy demonstrated presence of a grade 2 endometrial cancer. On May 08, 2010, she underwent surgical staging and final pathology noted a grade 3 tumor with 73% myometrial invasion. There was lymphovascular space involvement in 1 of 25 lymph nodes, is noted to be positive. The positive node was within the pelvis. The periaortic lymph nodes were negative. After discussion with the patient regarding her treatment option, she elected to participate in GOG 2008. She has randomized treatment chemotherapy and chemotherapy radiation, followed by chemotherapy. She completed the first 3 cycles of chemotherapy and external beam vaginal cuff brachytherapy. She has subsequently received an additional 3 cycles of Taxol and carboplatin therapy.  I last saw her 02/2012 at which time her exam and Pap smear were negative.  Interval History:  There has been no change in her bowel or bladder habits. . The low back pain does not radiate . She denies any weakness or numbness. This back pain as stated before has occurred previously but has been self-limited. This pain has not there continuously. She does feel like it comes and goes. It does not wake her up at night. She has no neuropathy. None in her hands. PS is 0.   She had a protocol required CT scan 4/13 and 10/13 that were negative for any evidence of disease. She had a CT scan as well as CXR for protocol purposes earlier this week that were both negative for recurrent disease. 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 Starting just before Christmas when she used her vaginal dilator, she would notice a bit of pink discharge. The dilator use was not any more painful and she did not use it with any more frequently than usual. She would have a bloody discharge but only after using her dilator. She did not use her dilator during the Christmas holiday and she had no bloody discharge. She then again use her dilator Monday and  began having some spotting and pink discharge with wiping. There is no pain associated with it and  no change in her bowel or bladder habits. No abdominal pain. She has gained some weight but attributes that to being on vacation. She has allergy symptoms but has not taken any OTC medications.  Review of Systems:  No nausea, vomiting, fever, chills, shortness of breath. She reports no residual fatigue. She reports neuropathy of the toes, the left is worse than the right but is is resolving. No bleeding from the rectum, bladder.  She denies any n/v/f/c/cp/sob. Otherwise, 10-point review of systems is negative, and the performance status is 0. There is no change in her family history except that her husbands mom passed away from an MI earlier this year.   Current Meds:  Outpatient Encounter Prescriptions as of 06/01/2012  Medication Sig Dispense Refill  . docusate sodium (COLACE) 100 MG capsule Take 100 mg by mouth as needed.       . ramipril (ALTACE) 10 MG capsule Take 1 capsule (10 mg total) by mouth daily.  90 capsule  3  . meloxicam (MOBIC) 15 MG tablet Take 7.5 mg by mouth as needed.      . [DISCONTINUED] meloxicam (MOBIC) 15 MG tablet Take 0.5 tablets (7.5 mg total) by mouth daily.  30 tablet  1   No facility-administered encounter medications on file as of 06/01/2012.    Allergy: No Known Allergies  Social Hx:   History   Social History  . Marital Status: Married    Spouse Name: N/A    Number of Children: N/A  . Years of Education: N/A    Occupational History  . Not on file.   Social History Main Topics  . Smoking status: Never Smoker   . Smokeless tobacco: Never Used  . Alcohol Use: 0.5 oz/week    1 drink(s) per week     Comment: Occas  . Drug Use: No  . Sexually Active: No   Other Topics Concern  . Not on file   Social History Narrative   exercsie at [employer] 4-6 times a week.    Diet: good    Past Surgical Hx:  Past Surgical History  Procedure Laterality Date  . Abdominal hysterectomy  05-08-10    LAPAROSCOPIC ROBOTIC ASSISSTED TOTAL HYSTERECTOMY WITH BSO , BILAT. PELVIC AND PERIAORTIC LYMPH NODE EVAL. BY DR. Duard Brady AT [facility]  . Cesarean section  01/08/80 11/21/82    x2    Past Medical Hx:  Past Medical History  Diagnosis Date  . Hypertension   . Osteoarthritis     HX OF SOME  OSTEOARTHRITIS  . 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 Pancreatic cancer Father   . Diabetes Brother   . Colon cancer Maternal Grandfather     Vitals:  Blood pressure 118/70, pulse 68, temperature 98.6 F (37 C), resp. rate 16, height 5' 7.72" (1.72 m), weight 199 lb 8 oz (90.493 kg).  Physical Exam: Well-nourished vulva female in no acute distress.  Neck: Supple no lymphadenopathy no thyromegaly.   Lungs: Clear to auscultation bilaterally.   Cardiovascular: Regular rate and rhythm.   Abdomen: Soft, nontender, nondistended. There are no palpable masses. There are no appreciable hernias.   Groins: No lymphadenopathy.   Extremities: No edema.   Pelvic: Normal external female genitalia. Vagina is markedly atrophic. There are postradiation changes at the top of the vagina. There is a gross visible lesion measuring about 5 mm at the top of the vagina. Pap smear was submitted. There was some small amount of bleeding as a Pap smear was obtained. After obtaining her verbal consent the entire lesion was  removed with a bx forcep.  Hemostasis was obtained with silver nitrate. Bimanual examination reveals no masses or nodularity. Rectal confirms.  Assessment/Plan: 57 year old with stage IIIc 1 endometrial carcinoma diagnosed in March of 2012 was completed all treatment. She completed all of her therapy in the fall of 2014 has been free of disease since that time. We'll followup in results for Pap smear from today as well as her biopsy. Return to see me in 3 months and again we will notify her of the results of her Pap smear and her biopsy.  Dasha Kawabata A., MD 06/01/2012, 9:38 AM

## 2012-06-01 NOTE — Patient Instructions (Signed)
I will call you with your biopsy results. 

## 2012-06-06 ENCOUNTER — Telehealth: Payer: Self-pay | Admitting: Gynecologic Oncology

## 2012-06-06 NOTE — Telephone Encounter (Signed)
I spoke with patient regarding the results of her vaginal biopsy from April 10. It revealed a benign vascular proliferation. There is no evidence of malignancy. On microscopic evaluation this tissue sections demonstrates a vascular proliferation with focal papillary architecture. The endothelial line of the vascular spaces is reactive and without atypia or hyperplasia. The findings are most consistent with a reactive vascular proliferation and the differential includes a benign soft tissue reactive process secondary to radiation therapy and avascular papillary hyperplasia or recanalization of the vessel. She was very pleased to hear these results I will followup for her next scheduled appointment.

## 2012-06-08 ENCOUNTER — Telehealth: Payer: Self-pay | Admitting: *Deleted

## 2012-06-08 ENCOUNTER — Telehealth: Payer: Self-pay | Admitting: Gynecologic Oncology

## 2012-06-08 NOTE — Telephone Encounter (Signed)
Pt notified about pap results: negative.  No questions or concerns voiced. 

## 2012-06-08 NOTE — Telephone Encounter (Signed)
Called Melissa [name] with biopsy and PAP smear results.

## 2012-06-13 ENCOUNTER — Ambulatory Visit: Payer: Self-pay | Admitting: Family Medicine

## 2012-06-13 ENCOUNTER — Encounter: Payer: Self-pay | Admitting: Family Medicine

## 2012-06-14 ENCOUNTER — Encounter: Payer: Self-pay | Admitting: *Deleted

## 2012-08-02 ENCOUNTER — Other Ambulatory Visit: Payer: Self-pay | Admitting: *Deleted

## 2012-08-02 MED ORDER — RAMIPRIL 10 MG PO CAPS: 10.0000 mg | ORAL_CAPSULE | Freq: Every day | ORAL | Status: DC

## 2012-08-02 NOTE — Telephone Encounter (Signed)
Received refill request electronically. Refill sent to pharmacy electronically. 

## 2012-08-24 ENCOUNTER — Telehealth: Payer: Self-pay | Admitting: Family Medicine

## 2012-08-24 DIAGNOSIS — I10 Essential (primary) hypertension: Secondary | ICD-10-CM

## 2012-08-24 DIAGNOSIS — E786 Lipoprotein deficiency: Secondary | ICD-10-CM

## 2012-08-24 NOTE — Telephone Encounter (Signed)
Message copied by Excell Seltzer on Thu Aug 24, 2012 10:15 AM ------      Message from: Alvina Chou      Created: Tue Aug 22, 2012  2:03 PM      Regarding: Lab orders for Monday, 7.7.14       Patient is scheduled for CPX labs, please order future labs, Thanks , Terri       ------

## 2012-08-28 ENCOUNTER — Other Ambulatory Visit (INDEPENDENT_AMBULATORY_CARE_PROVIDER_SITE_OTHER): Payer: Federal, State, Local not specified - PPO

## 2012-08-28 DIAGNOSIS — E786 Lipoprotein deficiency: Secondary | ICD-10-CM

## 2012-08-28 DIAGNOSIS — I10 Essential (primary) hypertension: Secondary | ICD-10-CM

## 2012-08-28 LAB — COMPREHENSIVE METABOLIC PANEL
Albumin: 4.1 g/dL (ref 3.5–5.2)
BUN: 17 mg/dL (ref 6–23)
CO2: 29 mEq/L (ref 19–32)
GFR: 97.48 mL/min (ref >60.00)
Glucose, Bld: 93 mg/dL (ref 70–99)
Potassium: 4.7 mEq/L (ref 3.5–5.1)
Sodium: 140 mEq/L (ref 135–145)
Total Protein: 7.1 g/dL (ref 6.0–8.3)

## 2012-08-28 LAB — LIPID PANEL
Cholesterol: 187 mg/dL (ref 0–200)
HDL: 39.2 mg/dL
LDL Cholesterol: 121 mg/dL — ABNORMAL HIGH (ref 0–99)
Total CHOL/HDL Ratio: 5
Triglycerides: 132 mg/dL (ref 0.0–149.0)
VLDL: 26.4 mg/dL (ref 0.0–40.0)

## 2012-08-29 ENCOUNTER — Encounter: Payer: Federal, State, Local not specified - PPO | Admitting: Family Medicine

## 2012-08-31 ENCOUNTER — Other Ambulatory Visit (HOSPITAL_COMMUNITY)
Admission: RE | Admit: 2012-08-31 | Discharge: 2012-08-31 | Disposition: A | Discharge status: Home / Self Care | Payer: Federal, State, Local not specified - PPO | Source: Ambulatory Visit | Attending: Gynecologic Oncology | Admitting: Gynecologic Oncology

## 2012-08-31 ENCOUNTER — Encounter: Payer: Self-pay | Admitting: Gynecologic Oncology

## 2012-08-31 ENCOUNTER — Ambulatory Visit: Payer: Federal, State, Local not specified - PPO | Attending: Gynecologic Oncology | Admitting: Gynecologic Oncology

## 2012-08-31 VITALS — BP 110/70 | HR 68 | Temp 98.5°F | Resp 16 | Ht 67.72 in | Wt 198.6 lb

## 2012-08-31 DIAGNOSIS — Z90710 Acquired absence of both cervix and uterus: Secondary | ICD-10-CM | POA: Insufficient documentation

## 2012-08-31 DIAGNOSIS — I10 Essential (primary) hypertension: Secondary | ICD-10-CM | POA: Insufficient documentation

## 2012-08-31 DIAGNOSIS — Z923 Personal history of irradiation: Secondary | ICD-10-CM | POA: Insufficient documentation

## 2012-08-31 DIAGNOSIS — C549 Malignant neoplasm of corpus uteri, unspecified: Secondary | ICD-10-CM | POA: Insufficient documentation

## 2012-08-31 DIAGNOSIS — C541 Malignant neoplasm of endometrium: Secondary | ICD-10-CM

## 2012-08-31 DIAGNOSIS — Z01419 Encounter for gynecological examination (general) (routine) without abnormal findings: Secondary | ICD-10-CM | POA: Insufficient documentation

## 2012-08-31 DIAGNOSIS — Z9221 Personal history of antineoplastic chemotherapy: Secondary | ICD-10-CM | POA: Insufficient documentation

## 2012-08-31 NOTE — Progress Notes (Signed)
Consult Note: Gyn-Onc  Melissa [name] 57 y.o. female  CC:  Chief Complaint  Patient presents with  . Endometrial cancer    Follow up    HPI: [name] presents today with new complaints following completion of GOG 258, for treatment of stage IIIC1 endometrial cancer.   This is a 57 year old, who presented to Dr. Macon [name] with complaints of watery vaginal discharge. Endometrial biopsy demonstrated presence of a grade 2 endometrial cancer. On May 08, 2010, she underwent surgical staging and final pathology noted a grade 3 tumor with 73% myometrial invasion. There was lymphovascular space involvement in 1 of 25 lymph nodes, is noted to be positive. The positive node was within the pelvis. The periaortic lymph nodes were negative. After discussion with the patient regarding her treatment option, she elected to participate in GOG 2008. She has randomized treatment chemotherapy and chemotherapy radiation, followed by chemotherapy. She completed the first 3 cycles of chemotherapy and external beam vaginal cuff brachytherapy. She has subsequently received an additional 3 cycles of Taxol and carboplatin therapy.  I last saw her 05/2012 at which time her Pap smear was negative. I noted at that time a lesion at the top of the vagina was biopsied and was felt to be benign vascular proliferation that could be status post radiation. I discussed these results with her by phone.  Interval History:  There has been no change in her bowel or bladder habits. . The low back pain does not radiate . She denies any weakness or numbness. This back pain as stated before has occurred previously but has been self-limited. This pain has not there continuously. She does feel like it comes and goes. It does not wake her up at night. She has no neuropathy. None in her hands. PS is 0.   She had a protocol required CT scan 4/13 and 10/13 that were negative for any evidence of disease. She had a CT scan as well as  CXR for protocol purposes in 4 /14 that were both negative for recurrent disease. 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   Review of Systems:  No nausea, vomiting, fever, chills, shortness of breath. She reports no residual fatigue. She reports neuropathy of the toes, the left is worse than the right but is is resolving. No bleeding from the rectum, bladder.  She denies any n/v/f/c/cp/sob. Otherwise, 10-point review of systems is negative, and the performance status is 0.    Current Meds:  Outpatient Encounter Prescriptions as of 08/31/2012  Medication Sig Dispense Refill  . docusate sodium (COLACE) 100 MG capsule Take 100 mg by mouth as needed.       . ramipril (ALTACE) 10 MG capsule Take 1 capsule (10 mg total) by mouth daily.  30 capsule  0   No facility-administered encounter medications on file as of 08/31/2012.    Allergy: No Known Allergies  Social Hx:   History   Social History  . Marital Status: Married    Spouse Name: N/A    Number of Children: N/A  . Years of Education: N/A   Occupational History  . Not on file.   Social History Main Topics  . Smoking status: Never Smoker   . Smokeless tobacco: Never Used  . Alcohol Use: 0.5 oz/week    1 drink(s) per week     Comment: Occas  . Drug Use: No  .  Sexually Active: No   Other Topics Concern  . 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    Diet: good    Past Surgical Hx:  Past Surgical History  Procedure Laterality Date  . Abdominal hysterectomy  05-08-10    LAPAROSCOPIC ROBOTIC ASSISSTED TOTAL HYSTERECTOMY WITH BSO , BILAT. PELVIC AND PERIAORTIC LYMPH NODE EVAL. BY DR. Duard [name] AT [facility]  . Cesarean section  01/08/80 11/21/82    x2    Past Medical Hx:  Past Medical History  Diagnosis Date  . Hypertension   . Osteoarthritis     HX OF SOME  OSTEOARTHRITIS  . Cancer of  uterine body dx'd 04/14/10    radical hysterectomy, chemotherapy and radiation therapy    Family Hx:  Family History  Problem Relation Age of Onset  . Pancreatic cancer Father   . Diabetes Brother   . Colon cancer Maternal Grandfather     Vitals:  Blood pressure 110/70, pulse 68, temperature 98.5 F (36.9 C), temperature source Oral, resp. rate 16, height 5' 7.72" (1.72 m), weight 198 lb 9.6 oz (90.084 kg).  Physical Exam: Well-nourished vulva female in no acute distress.  Neck: Supple no lymphadenopathy no thyromegaly.   Lungs: Clear to auscultation bilaterally.   Cardiovascular: Regular rate and rhythm.   Abdomen: Soft, nontender, nondistended. There are no palpable masses. There are no appreciable hernias.   Groins: No lymphadenopathy.   Extremities: No edema.   Pelvic: Normal external female genitalia. Vagina is markedly atrophic. There are postradiation changes at the top of the vagina. Pap smear submitted without difficulty. Bimanual examination reveals no masses or nodularity. Rectal confirms.  Assessment/Plan: 57 year old with stage IIIc 1 endometrial carcinoma diagnosed in March of 2012 was completed all treatment. She completed all of her therapy in the fall of 2012 has been free of disease since that time. We'll followup in results for Pap smear from today. Return to see me in 3 months and again we will notify her of the results of her Pap smear. Protocol purposes she will be due approximately August 18 for a pelvic exam. She'll need labs and scans prior. Labs will include a CBC, comprehensive metabolic panel, CT scan of the abdomen and pelvis and chest x-ray.  Melissa [name] A., MD 08/31/2012, 10:39 AM

## 2012-09-05 ENCOUNTER — Ambulatory Visit (INDEPENDENT_AMBULATORY_CARE_PROVIDER_SITE_OTHER): Payer: Federal, State, Local not specified - PPO | Admitting: Family Medicine

## 2012-09-05 ENCOUNTER — Encounter: Payer: Self-pay | Admitting: Family Medicine

## 2012-09-05 VITALS — BP 120/70 | HR 59 | Temp 98.4°F | Ht 67.75 in | Wt 199.8 lb

## 2012-09-05 DIAGNOSIS — I10 Essential (primary) hypertension: Secondary | ICD-10-CM

## 2012-09-05 DIAGNOSIS — Z0000 Encounter for general adult medical examination without abnormal findings: Secondary | ICD-10-CM

## 2012-09-05 MED ORDER — RAMIPRIL 10 MG PO CAPS: 10.0000 mg | ORAL_CAPSULE | Freq: Every day | ORAL | Status: DC

## 2012-09-05 NOTE — Patient Instructions (Addendum)
Work on healthy eating, regular exercise and weight loss. 

## 2012-09-05 NOTE — Progress Notes (Signed)
57 year old female. The patient is here for annual wellness exam and preventative care.    Hypertension: Well controlled on ramipril.  Using medication without problems or lightheadedness: None  Chest pain with exertion:None  Edema: None  Short of breath:None  Average home BPs: At home 110/70.  Other issues:   Wt Readings from Last 3 Encounters:  09/05/12 199 lb 12.8 oz (90.629 kg)  08/31/12 198 lb 9.6 oz (90.084 kg)  06/01/12 199 lb 8 oz (90.493 kg)     Reviewed labs in detail, Cholesterol well controlled and LDL at goal <130.  Lab Results  Component Value Date   CHOL 187 08/28/2012   HDL 39.20 08/28/2012   LDLCALC 121* 08/28/2012   TRIG 132.0 08/28/2012   CHOLHDL 5 08/28/2012     History    Social History   .  Marital Status:  Married     Spouse Name:  N/A     Number of Children:  N/A   .  Years of Education:  N/A    Social History Main Topics   .  Smoking status:  Never Smoker   .  Smokeless tobacco:  Never Used   .  Alcohol Use:  0.5 oz/week     1 drink(s) per week      Occas   .  Drug Use:  No   .  Sexually Active:  No    Other Topics  Concern   .  None    Social History Narrative    exercsie at [employer] 4-6 times a week. Diet: good    Review of Systems  Constitutional: Negative for fever, fatigue and unexpected weight change.  HENT: Negative for ear pain, congestion, sore throat, sneezing, trouble swallowing and sinus pressure.  Eyes: Negative for pain and itching.  Respiratory: Negative for cough, shortness of breath and wheezing.  Cardiovascular: Negative for chest pain, palpitations and leg swelling.  Gastrointestinal: Negative for nausea, abdominal pain, diarrhea, constipation and blood in stool.  Genitourinary: Negative for dysuria, hematuria, vaginal discharge, difficulty urinating and menstrual problem.  Skin: Negative for rash.  Neurological: Negative for syncope, weakness, light-headedness, numbness and headaches.  Psychiatric/Behavioral: Negative for  confusion and dysphoric mood. The patient is not nervous/anxious.  Objective:   Physical Exam  Constitutional: Vital signs are normal. She appears well-developed and well-nourished. She is cooperative. Non-toxic appearance. She does not appear ill. No distress.  HENT:  Head: Normocephalic.  Right Ear: Hearing, tympanic membrane, external ear and ear canal normal.  Left Ear: Hearing, tympanic membrane, external ear and ear canal normal.  Nose: Nose normal.  Eyes: Conjunctivae, EOM and lids are normal. Pupils are equal, round, and reactive to light. No foreign bodies found.  Neck: Trachea normal and normal range of motion. Neck supple. Carotid bruit is not present. No mass and no thyromegaly present.  Cardiovascular: Normal rate, regular rhythm, S1 normal, S2 normal, normal heart sounds and intact distal pulses. Exam reveals no gallop.  No murmur heard.  Pulmonary/Chest: Effort normal and breath sounds normal. No respiratory distress. She has no wheezes. She has no rhonchi. She has no rales.  Abdominal: Soft. Normal appearance and bowel sounds are normal. She exhibits no distension, no fluid wave, no abdominal bruit and no mass. There is no hepatosplenomegaly. There is no tenderness. There is no rebound, no guarding and no CVA tenderness. No hernia.  Lymphadenopathy:  She has no cervical adenopathy.  She has no axillary adenopathy.  Neurological: She is alert. She has  normal strength. No cranial nerve deficit or sensory deficit.  Skin: Skin is warm, dry and intact. No rash noted.  Psychiatric: Her speech is normal and behavior is normal. Judgment normal. Her mood appears not anxious. Cognition and memory are normal. She does not exhibit a depressed mood.   Breast: no masses, no nipple discharge B Pelvic per GYN Assessment & Plan:   The patient's preventative maintenance and recommended screening tests for an annual wellness exam were reviewed in full today.  Brought up to date unless services  declined.  Counselled on the importance of diet, exercise, and its role in overall health and mortality.  The patient's FH and SH was reviewed, including their home life, tobacco status, and drug and alcohol status.   PAP/GYN: hx of endometrial cancer, s/s total abdominal hysterectomy, pap per Gyn onc,  Pelvic occuring every three months Vaccines: uptodate with Td  Colon: Dr. Russella Dar.  Nml 2013, recommended repeat in 5 year.Due in 2018. Mammogram:06/14/2012 nml... heterogenously dense breasts.. She may consider 3D mammogram next year. DEXA: mother with osteoporosis, no other risk factors...plan to check at age 12. For now ca, vit D in diet and exercise.

## 2012-09-05 NOTE — Addendum Note (Signed)
Addended by: Kerby Nora E on: 09/05/2012 12:21 PM   Modules accepted: Orders

## 2012-09-06 ENCOUNTER — Telehealth: Payer: Self-pay | Admitting: Gynecologic Oncology

## 2012-09-06 NOTE — Telephone Encounter (Signed)
Spoke with the patient's husband.  Patient unavailable at this time.  Informed of pap smear results and instructed to call the office for any questions or concerns.

## 2012-09-20 ENCOUNTER — Other Ambulatory Visit: Payer: Self-pay | Admitting: *Deleted

## 2012-09-20 DIAGNOSIS — C55 Malignant neoplasm of uterus, part unspecified: Secondary | ICD-10-CM

## 2012-09-21 ENCOUNTER — Telehealth: Payer: Self-pay | Admitting: Gynecologic Oncology

## 2012-09-21 NOTE — Telephone Encounter (Signed)
S/w thwe pt and she is aware of her lab appt on 11/29/2012@9 :30am

## 2012-11-17 ENCOUNTER — Other Ambulatory Visit: Payer: Self-pay | Admitting: *Deleted

## 2012-11-21 ENCOUNTER — Telehealth: Payer: Self-pay

## 2012-11-21 NOTE — Telephone Encounter (Signed)
Pt received message from [facility] message board pt needs to have mammogram. Pt had mammo on 06/13/12; explained trying to notify pts when need to schedule testing. Apologized to pt since she has had mammo done. Pt understood.

## 2012-11-29 ENCOUNTER — Encounter (HOSPITAL_COMMUNITY): Payer: Self-pay

## 2012-11-29 ENCOUNTER — Ambulatory Visit (HOSPITAL_COMMUNITY)
Admission: RE | Admit: 2012-11-29 | Discharge: 2012-11-29 | Disposition: A | Discharge status: Home / Self Care | Payer: Federal, State, Local not specified - PPO | Source: Ambulatory Visit | Attending: Gynecologic Oncology | Admitting: Gynecologic Oncology

## 2012-11-29 ENCOUNTER — Other Ambulatory Visit (HOSPITAL_BASED_OUTPATIENT_CLINIC_OR_DEPARTMENT_OTHER): Payer: Federal, State, Local not specified - PPO | Admitting: Lab

## 2012-11-29 DIAGNOSIS — C55 Malignant neoplasm of uterus, part unspecified: Secondary | ICD-10-CM

## 2012-11-29 DIAGNOSIS — C549 Malignant neoplasm of corpus uteri, unspecified: Secondary | ICD-10-CM

## 2012-11-29 DIAGNOSIS — D1803 Hemangioma of intra-abdominal structures: Secondary | ICD-10-CM | POA: Insufficient documentation

## 2012-11-29 DIAGNOSIS — Z9079 Acquired absence of other genital organ(s): Secondary | ICD-10-CM | POA: Insufficient documentation

## 2012-11-29 DIAGNOSIS — Z90710 Acquired absence of both cervix and uterus: Secondary | ICD-10-CM | POA: Insufficient documentation

## 2012-11-29 DIAGNOSIS — Z9221 Personal history of antineoplastic chemotherapy: Secondary | ICD-10-CM | POA: Insufficient documentation

## 2012-11-29 DIAGNOSIS — Z923 Personal history of irradiation: Secondary | ICD-10-CM | POA: Insufficient documentation

## 2012-11-29 LAB — COMPREHENSIVE METABOLIC PANEL (CC13)
Albumin: 4 g/dL (ref 3.5–5.0)
Alkaline Phosphatase: 67 U/L (ref 40–150)
BUN: 14.1 mg/dL (ref 7.0–26.0)
Glucose: 92 mg/dl (ref 70–140)
Potassium: 5.4 mEq/L — ABNORMAL HIGH (ref 3.5–5.1)
Total Bilirubin: 0.56 mg/dL (ref 0.20–1.20)

## 2012-11-29 LAB — CBC WITH DIFFERENTIAL/PLATELET
Basophils Absolute: 0 10*3/uL (ref 0.0–0.1)
Eosinophils Absolute: 0.1 10*3/uL (ref 0.0–0.5)
HCT: 38.7 % (ref 34.8–46.6)
HGB: 12.9 g/dL (ref 11.6–15.9)
LYMPH%: 23.1 % (ref 14.0–49.7)
MCV: 86.8 fL (ref 79.5–101.0)
MONO%: 5.6 % (ref 0.0–14.0)
NEUT#: 5.3 10*3/uL (ref 1.5–6.5)
NEUT%: 70 % (ref 38.4–76.8)
Platelets: 223 10*3/uL (ref 145–400)

## 2012-11-29 MED ORDER — IOHEXOL 300 MG/ML  SOLN
100.0000 mL | Freq: Once | INTRAMUSCULAR | Status: AC | PRN
  Administered 2012-11-29: 100 mL via INTRAVENOUS

## 2012-11-30 ENCOUNTER — Ambulatory Visit: Payer: Federal, State, Local not specified - PPO | Attending: Gynecologic Oncology | Admitting: Gynecologic Oncology

## 2012-11-30 ENCOUNTER — Other Ambulatory Visit (HOSPITAL_COMMUNITY)
Admission: RE | Admit: 2012-11-30 | Discharge: 2012-11-30 | Disposition: A | Discharge status: Home / Self Care | Payer: Federal, State, Local not specified - PPO | Source: Ambulatory Visit | Attending: Gynecologic Oncology | Admitting: Gynecologic Oncology

## 2012-11-30 ENCOUNTER — Encounter: Payer: Self-pay | Admitting: Gynecologic Oncology

## 2012-11-30 VITALS — BP 145/81 | HR 65 | Temp 98.1°F | Resp 16 | Ht 67.72 in | Wt 194.8 lb

## 2012-11-30 DIAGNOSIS — Z79899 Other long term (current) drug therapy: Secondary | ICD-10-CM | POA: Insufficient documentation

## 2012-11-30 DIAGNOSIS — C541 Malignant neoplasm of endometrium: Secondary | ICD-10-CM

## 2012-11-30 DIAGNOSIS — Z9221 Personal history of antineoplastic chemotherapy: Secondary | ICD-10-CM | POA: Insufficient documentation

## 2012-11-30 DIAGNOSIS — M545 Low back pain, unspecified: Secondary | ICD-10-CM | POA: Insufficient documentation

## 2012-11-30 DIAGNOSIS — D1803 Hemangioma of intra-abdominal structures: Secondary | ICD-10-CM | POA: Insufficient documentation

## 2012-11-30 DIAGNOSIS — Z923 Personal history of irradiation: Secondary | ICD-10-CM | POA: Insufficient documentation

## 2012-11-30 DIAGNOSIS — Z01419 Encounter for gynecological examination (general) (routine) without abnormal findings: Secondary | ICD-10-CM | POA: Insufficient documentation

## 2012-11-30 DIAGNOSIS — I10 Essential (primary) hypertension: Secondary | ICD-10-CM | POA: Insufficient documentation

## 2012-11-30 DIAGNOSIS — Z90710 Acquired absence of both cervix and uterus: Secondary | ICD-10-CM | POA: Insufficient documentation

## 2012-11-30 DIAGNOSIS — C549 Malignant neoplasm of corpus uteri, unspecified: Secondary | ICD-10-CM | POA: Insufficient documentation

## 2012-11-30 NOTE — Progress Notes (Signed)
Consult Note: Gyn-Onc  [name] 57 y.o. female  CC:  Chief Complaint  Patient presents with  . Endometrial cancer    Follow up    HPI: REASON FOR VISIT: [name] presents today following completion of GOG 258, for treatment of stage IIIC1 endometrial cancer.   HISTORY OF PRESENT ILLNESS: This is a 57 year old, who presented to Dr. Macon [name] with complaints of watery vaginal discharge. Endometrial biopsy demonstrated presence of a grade 2 endometrial cancer. On May 08, 2010, she underwent surgical staging and final pathology noted a grade 3 tumor with 73% myometrial invasion. There was lymphovascular space involvement in 1 of 25 lymph nodes, is noted to be positive. The positive node was within the pelvis. The periaortic lymph nodes were negative. After discussion with the patient regarding her treatment option, she elected to participate in GOG 2008. She has randomized treatment chemotherapy and chemotherapy radiation, followed by chemotherapy. She completed the first 3 cycles of chemotherapy and external beam vaginal cuff brachytherapy. She has subsequently received an additional 3 cycles of Taxol and carboplatin therapy.  PS is 0. She had a protocol required CT scan 4/13 and 10/13 that were negative for any evidence of disease. She had a CT scan as well as CXR for protocol purposes in 4 /14 that were both negative for recurrent disease. 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   Interval History:  Protocol required CT scan was performed yesterday that revealed:  FINDINGS:  Lower Chest: Clear lung bases. Normal heart size without pericardial or pleural effusion.  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 Probable perfusion anomaly more cephalad on image 15/ series 2. No suspicious liver lesion. Normal spleen, stomach, pancreas, gallbladder, biliary  tract, adrenal glands, kidneys. No retroperitoneal or retrocrural adenopathy. Normal colon and terminal ileum. Normal small bowel without abdominal ascites. No evidence of omental or peritoneal disease. No pelvic adenopathy. Hysterectomy. Normal urinary bladder. Bilateral oophorectomy. No significant free fluid. Bones/Musculoskeletal: No acute osseous abnormality.  IMPRESSION:  1. Status post hysterectomy and bilateral oophorectomy. No evidence of recurrent or metastatic disease.  2. Right liver lobe hemangiomas, as before.  She also had a chest x-ray that was negative. Lab work was obtained including a CBC and chemistries. They were all normal with exception of her potassium was 5.4. She is completely asymptomatic. She's not taking any potassium supplementation.  No new medical problems and her family.  Review of Systems There has been no change in her bowel or bladder habits.The low back pain does not radiate . She denies any weakness or numbness. This back pain as stated before has occurred previously but has been self-limited. This pain has not there continuously. She does feel like it comes and goes. It does not wake her up at night. She has neuropathy in her feet. She does notice everyday but it does not limit her activities or significantly interfere with her quality of life. None in her hands. 10 point review of systems is otherwise negative.  Current Meds:  Outpatient Encounter Prescriptions as of 11/30/2012  Medication Sig Dispense Refill  . docusate sodium (COLACE) 100 MG capsule Take 100 mg by mouth as needed.       . meloxicam (MOBIC) 15 MG tablet Take 15 mg by mouth daily.      . ramipril (ALTACE) 10 MG capsule Take 1 capsule (10 mg total) by mouth daily.  90 capsule  3   No facility-administered encounter medications on file as of 11/30/2012.    Allergy: No Known Allergies  Social Hx:   History   Social History  . Marital Status: Married    Spouse Name: N/A    Number of  Children: N/A  . Years of Education: N/A   Occupational History  . Not on file.   Social History Main Topics  . Smoking status: Never Smoker   . Smokeless tobacco: Never Used  . Alcohol Use: 0.5 oz/week    1 drink(s) per week     Comment: Occas  . Drug Use: No  . Sexual Activity: No   Other Topics Concern  . Not on file   Social History Narrative   exercsie at [facility] [employer] 4-6 times a week.    Diet: good    Past Surgical Hx:  Past Surgical History  Procedure Laterality Date  . Abdominal hysterectomy  05-08-10    LAPAROSCOPIC ROBOTIC ASSISSTED TOTAL HYSTERECTOMY WITH BSO , BILAT. PELVIC AND PERIAORTIC LYMPH NODE EVAL. BY DR. Duard Brady AT [facility]  . Cesarean section  01/08/80 11/21/82    x2    Past Medical Hx:  Past Medical History  Diagnosis Date  . Hypertension   . Osteoarthritis     HX OF SOME  OSTEOARTHRITIS  . Cancer of uterine body dx'd 04/14/10    radical hysterectomy, chemotherapy and radiation therapy    Oncology Hx:    Cancer of uterus   05/08/2010 Initial Diagnosis Cancer of uterus, IIIC1    - 12/01/2010 Chemotherapy completed chemo therapy with 6 cycles of paclitaxel and carboplatin and vaginal cuff brachytherapy on GOG 258    Family Hx:  Family History  Problem Relation Age of Onset  . Pancreatic cancer Father   . Diabetes Brother   . Colon cancer Maternal Grandfather     Vitals:  Blood pressure 145/81, pulse 65, temperature 98.1 F (36.7 C), temperature source Oral, resp. rate 16, height 5' 7.72" (1.72 m), weight 194 lb 12.8 oz (88.361 kg).  Physical Exam: CHEST: Clear to auscultation.   HEART: Regular rate and rhythm.   LYMPH NODES: No cervical, supraclavicular, inguinal adenopathy.   ABDOMEN: Soft, obese, nontender. No evidence of hernia. No tenderness.   BACK: No CVA tenderness.  GU: Vagina is atrophic. No lesions. Pap smear submitted without difficulty.  No masses appreciated. Internal examination, no cul-de-sac masses. No nodularity in the  rectovaginal septum.   RECTAL: Good anal sphincter tone without any masses.     Assessment/Plan: IMPRESSION: Ms. Persing has completed definitive treatment for her stage IIIC1 grade 3 endometrial cancer. She has no evidence of disease both on physical examination as well as protocol required imaging. She was provided a copy of her scan results as well as her labs. She will discuss the potassium with her primary care provider.  She will follow up in 6 months as per the protocol. All of the patient's questions were answered.   Kurtis Anastasia A., MD 11/30/2012, 9:10 AM

## 2012-11-30 NOTE — Patient Instructions (Signed)
Return to clinic in 6 months.

## 2012-12-04 ENCOUNTER — Telehealth: Payer: Self-pay | Admitting: Gynecologic Oncology

## 2012-12-04 NOTE — Telephone Encounter (Signed)
Pt notified about pap results: negative.  No questions or concerns voiced. 

## 2012-12-28 ENCOUNTER — Other Ambulatory Visit: Payer: Self-pay

## 2013-03-24 IMAGING — CT CT ABD-PELV W/ CM
2 of 6 series · 17 of 46 positions shown, 19 images · IV contrast (agent unspecified)
Comparison: None

CLINICAL DATA: Newly diagnosed endometrial carcinoma.

CT ABDOMEN AND PELVIS WITH CONTRAST
TECHNIQUE: Multidetector CT imaging of the abdomen and pelvis was
performed following the standard protocol during bolus
administration of intravenous contrast.
Contrast: 100 ml [id number] and oral contrast

[Series 2: rtn a/p with · axial · 0.82mm/px · z∈[-568,-174]mm · 14 of 91 slices shown, 16 images]
[im 6/91  soft-tissue]
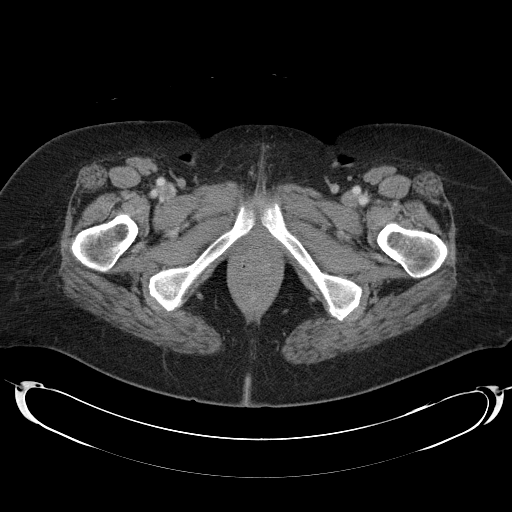
[im 6/91  bone]
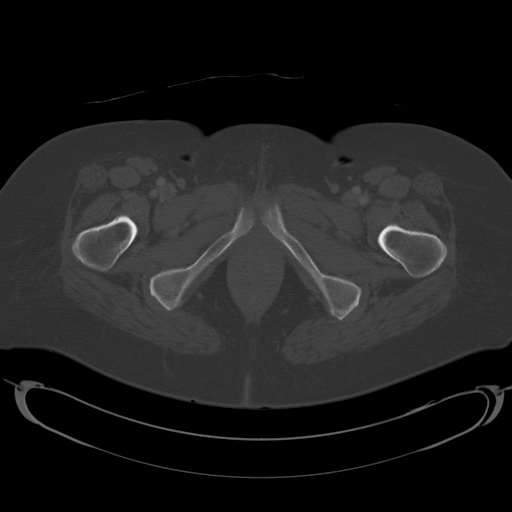
[im 12/91  soft-tissue]
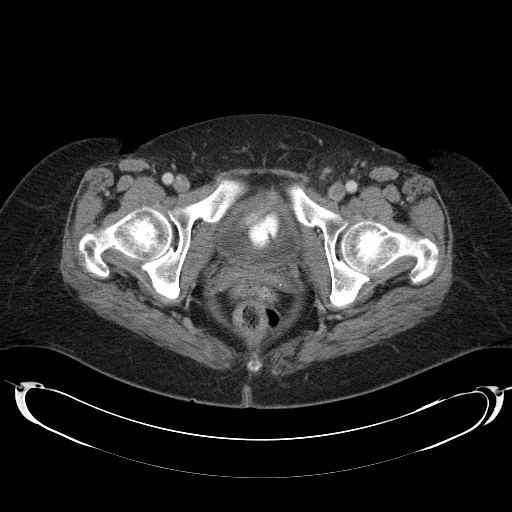
[im 17/91  soft-tissue]
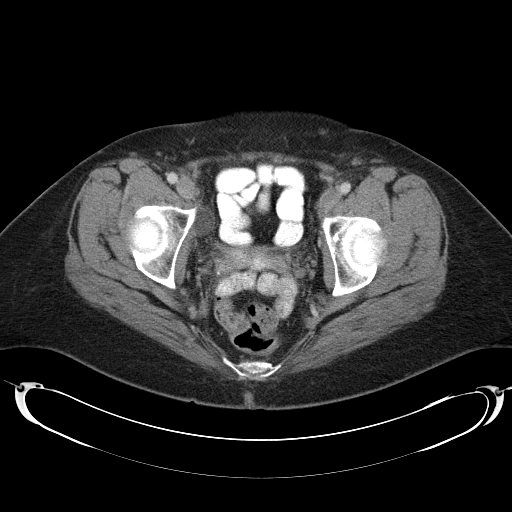
[im 23/91  soft-tissue]
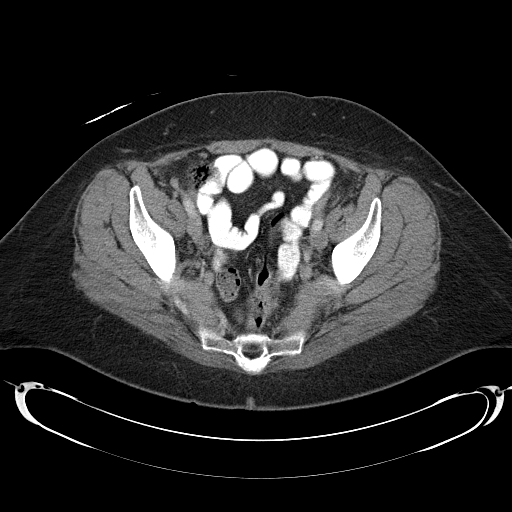
[im 29/91  soft-tissue]
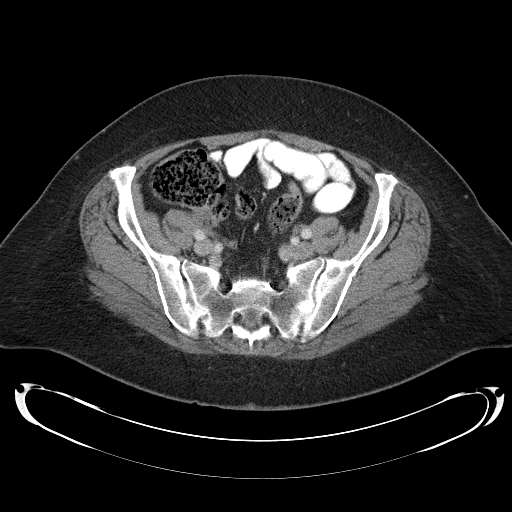
[im 34/91  soft-tissue]
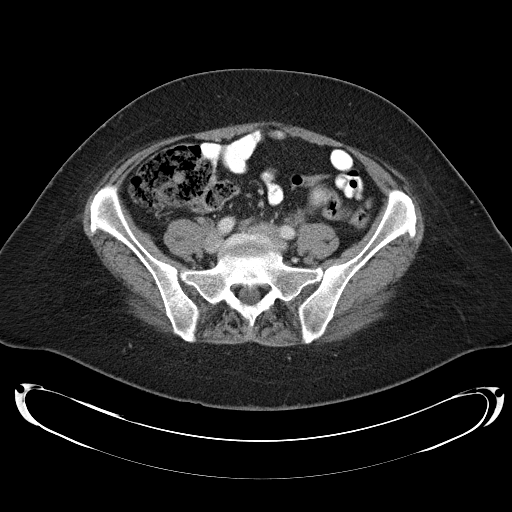
[im 40/91  soft-tissue]
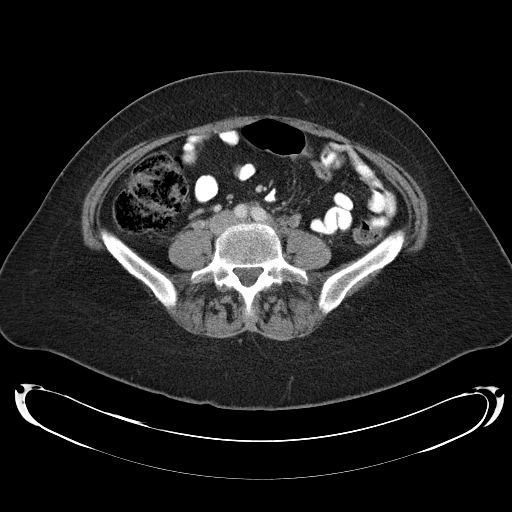
[im 51/91  soft-tissue]
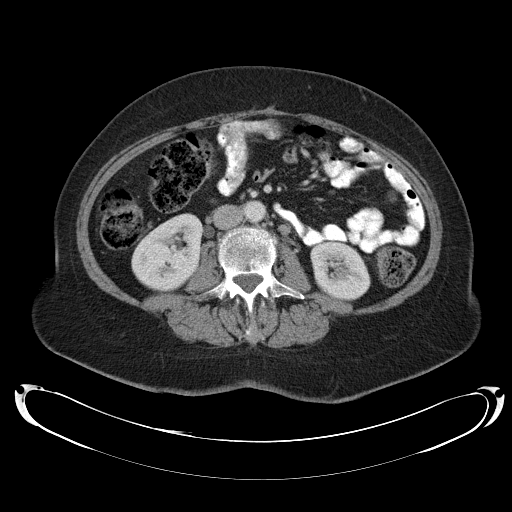
[im 57/91  soft-tissue]
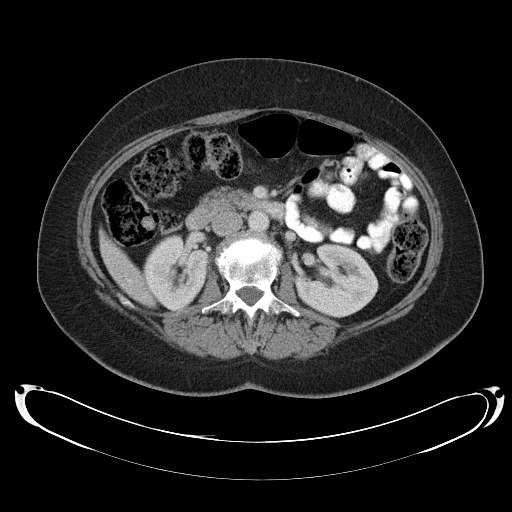
[im 57/91  bone]
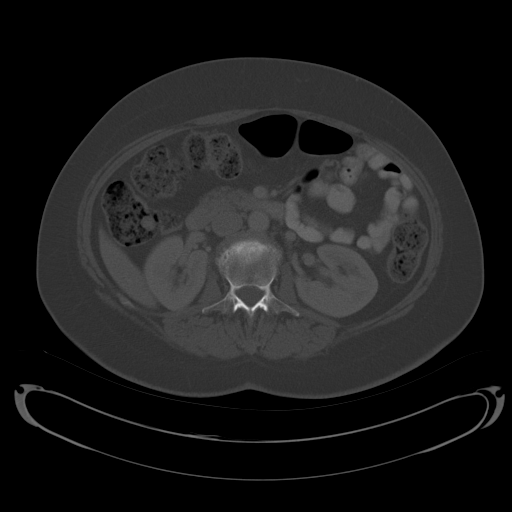
[im 62/91  soft-tissue]
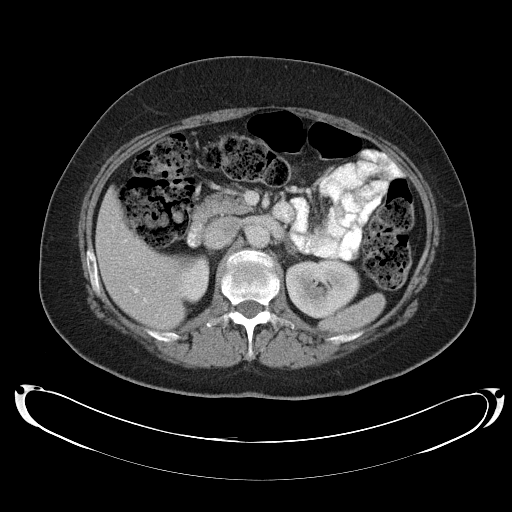
[im 68/91  soft-tissue]
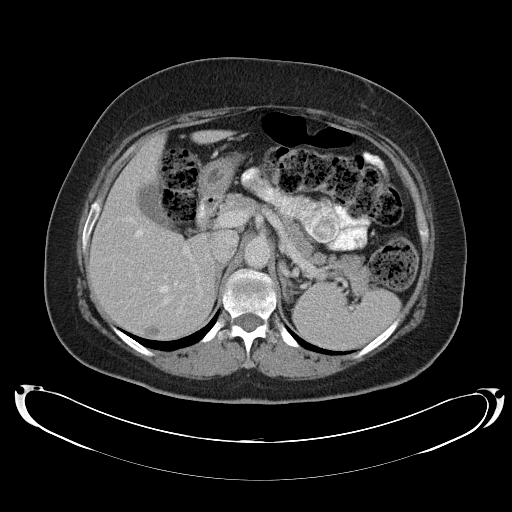
[im 74/91  soft-tissue]
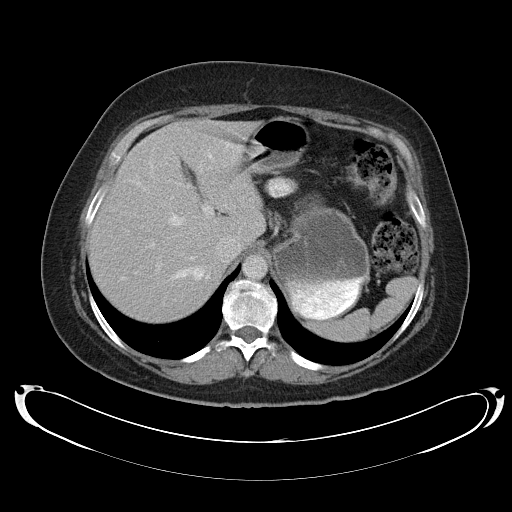
[im 79/91  soft-tissue]
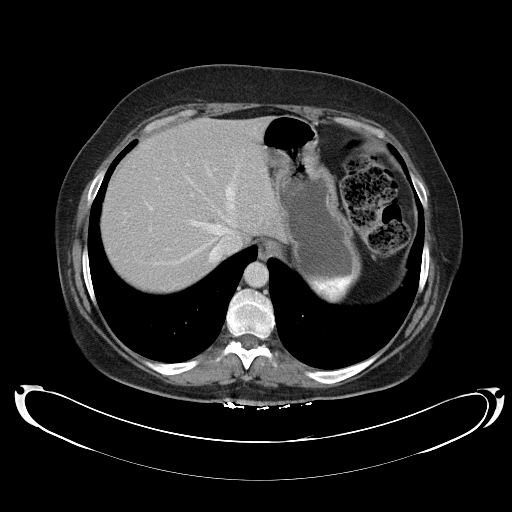
[im 85/91  soft-tissue]
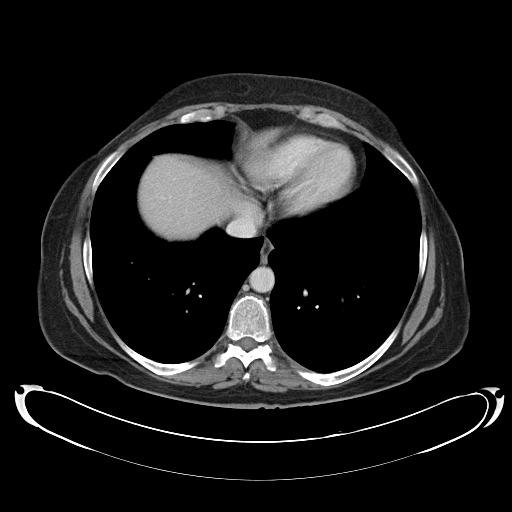

[Series 602: <mpr thick range> · coronal · 0.89mm/px · 3 of 89 slices shown]
[im 30/89  soft-tissue]
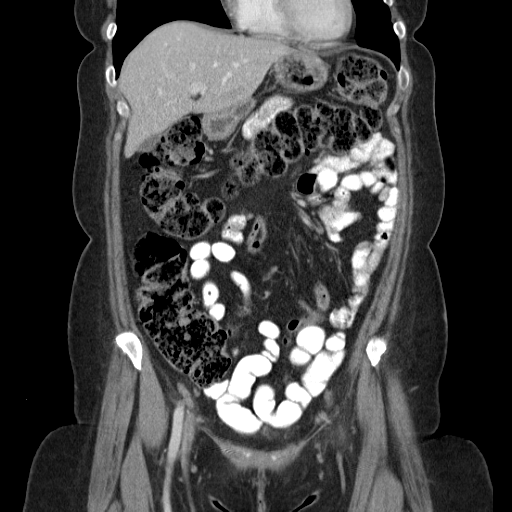
[im 40/89  soft-tissue]
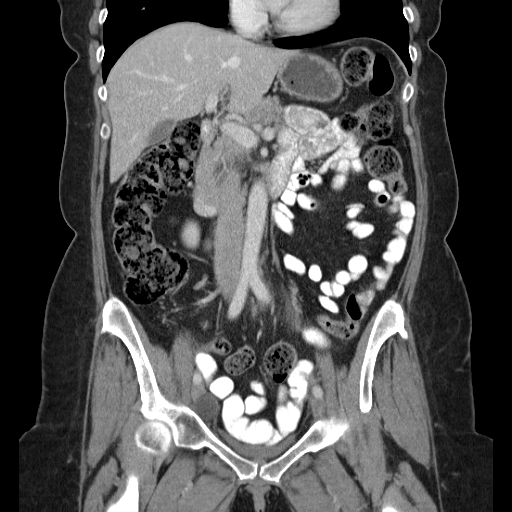
[im 49/89  soft-tissue]
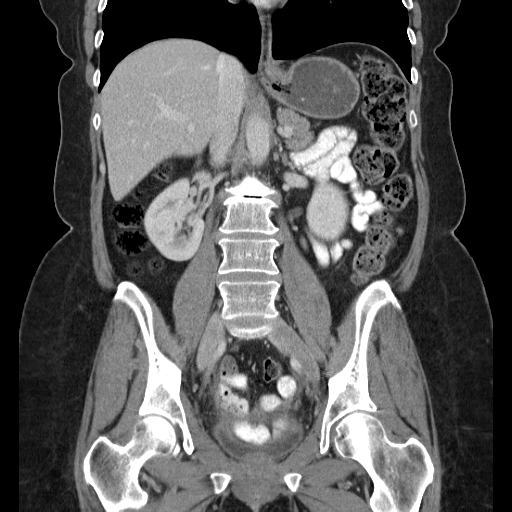

[17 of 46 positions shown; findings below may reference images not displayed]

FINDINGS: Previous hysterectomy and bilateral salpingo-oophorectomy
noted.  A small fluid collection is seen along the right external
iliac vessels and pelvic side wall which measures 2.5 x 3 cm, and
is consistent with a small postoperative lymphocele.  No soft
tissue masses or pathologically enlarged lymph nodes are identified
within the pelvis or abdomen.

Several small low attenuation lesions are seen within the right
hepatic lobe, with largest measuring 1.8 cm.  These are difficult
to characterize due to their small size, are nonspecific.  These do
not meet criteria for simple cysts, and metastases cannot
definitely be excluded.

The spleen, pancreas, adrenal glands, and kidneys are normal in
appearance.  No evidence of hydronephrosis.  No other soft tissue
masses are identified.  No evidence of inflammatory process or
abnormal fluid collections.  No evidence of bowel wall thickening
or dilatation.
IMPRESSION: 1.  Multiple small indeterminate low attenuation liver lesions,
largest measuring 1.8 cm.  Abdomen MRI without and with contrast
recommended for further characterization.
2.  No evidence of abdominal or pelvic lymphadenopathy or other
significant abnormality.
3.  3 cm postoperative lymphocele in right pelvis, attributed to
previous hysterectomy and bilateral salpingo-oophorectomies.

## 2013-05-22 ENCOUNTER — Other Ambulatory Visit: Payer: Self-pay

## 2013-05-22 DIAGNOSIS — Z1231 Encounter for screening mammogram for malignant neoplasm of breast: Secondary | ICD-10-CM

## 2013-06-06 ENCOUNTER — Other Ambulatory Visit (HOSPITAL_COMMUNITY)
Admission: RE | Admit: 2013-06-06 | Discharge: 2013-06-06 | Disposition: A | Discharge status: Home / Self Care | Payer: Federal, State, Local not specified - PPO | Source: Ambulatory Visit | Attending: Gynecologic Oncology | Admitting: Gynecologic Oncology

## 2013-06-06 ENCOUNTER — Ambulatory Visit: Payer: Federal, State, Local not specified - PPO | Attending: Gynecologic Oncology | Admitting: Gynecologic Oncology

## 2013-06-06 ENCOUNTER — Encounter: Payer: Self-pay | Admitting: Gynecologic Oncology

## 2013-06-06 VITALS — BP 129/73 | HR 67 | Temp 97.8°F | Resp 16 | Ht 67.0 in | Wt 193.1 lb

## 2013-06-06 DIAGNOSIS — C549 Malignant neoplasm of corpus uteri, unspecified: Secondary | ICD-10-CM | POA: Insufficient documentation

## 2013-06-06 DIAGNOSIS — I10 Essential (primary) hypertension: Secondary | ICD-10-CM | POA: Insufficient documentation

## 2013-06-06 DIAGNOSIS — M1990 Unspecified osteoarthritis, unspecified site: Secondary | ICD-10-CM | POA: Insufficient documentation

## 2013-06-06 DIAGNOSIS — Z90710 Acquired absence of both cervix and uterus: Secondary | ICD-10-CM | POA: Insufficient documentation

## 2013-06-06 DIAGNOSIS — Z9221 Personal history of antineoplastic chemotherapy: Secondary | ICD-10-CM | POA: Insufficient documentation

## 2013-06-06 DIAGNOSIS — M545 Low back pain, unspecified: Secondary | ICD-10-CM | POA: Insufficient documentation

## 2013-06-06 DIAGNOSIS — C541 Malignant neoplasm of endometrium: Secondary | ICD-10-CM

## 2013-06-06 DIAGNOSIS — Z01419 Encounter for gynecological examination (general) (routine) without abnormal findings: Secondary | ICD-10-CM | POA: Insufficient documentation

## 2013-06-06 DIAGNOSIS — Z833 Family history of diabetes mellitus: Secondary | ICD-10-CM | POA: Insufficient documentation

## 2013-06-06 DIAGNOSIS — Z800 Family history of malignant neoplasm of digestive organs: Secondary | ICD-10-CM | POA: Insufficient documentation

## 2013-06-06 DIAGNOSIS — Z8042 Family history of malignant neoplasm of prostate: Secondary | ICD-10-CM | POA: Insufficient documentation

## 2013-06-06 DIAGNOSIS — Z923 Personal history of irradiation: Secondary | ICD-10-CM | POA: Insufficient documentation

## 2013-06-06 NOTE — Patient Instructions (Signed)
Return to clinic in 6 months. We will followup in results of your Pap smear that you the results

## 2013-06-06 NOTE — Progress Notes (Signed)
Consult Note: Gyn-Onc  Melissa [name] 58 y.o. female  CC:  Chief Complaint  Patient presents with  . Endo ca    Follow up     HPI: REASON FOR VISIT: [name] presents today following completion of GOG 258, for treatment of stage IIIC1 endometrial cancer.   HISTORY OF PRESENT ILLNESS: This is a 58 year old, who presented to Dr. Harolyn Rutherford with complaints of watery vaginal discharge. Endometrial biopsy demonstrated presence of a grade 2 endometrial cancer. On May 08, 2010, she underwent surgical staging and final pathology noted a grade 3 tumor with 73% myometrial invasion. There was lymphovascular space involvement in 1 of 25 lymph nodes, is noted to be positive. The positive node was within the pelvis. The periaortic lymph nodes were negative. After discussion with the patient regarding her treatment option, she elected to participate in GOG 2008. She has randomized treatment chemotherapy and chemotherapy radiation, followed by chemotherapy. She completed the first 3 cycles of chemotherapy and external beam vaginal cuff brachytherapy. She has subsequently received an additional 3 cycles of Taxol and carboplatin therapy.  PS is 0. She had a protocol required CT scan 4/13 and 10/13 that were negative for any evidence of disease. She had a CT scan as well as CXR for protocol purposes in 4 /14 that were both negative for recurrent disease. 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   Protocol required CT scan was performed yesterday that revealed:  FINDINGS:  Lower Chest: Clear lung bases. Normal heart size without pericardial or pleural effusion.  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 Probable perfusion anomaly more cephalad on image 15/ series 2. No suspicious liver lesion. Normal spleen, stomach, pancreas, gallbladder, biliary tract, adrenal glands, kidneys.  No retroperitoneal or retrocrural adenopathy. Normal colon and terminal ileum. Normal small bowel without abdominal ascites. No evidence of omental or peritoneal disease. No pelvic adenopathy. Hysterectomy. Normal urinary bladder. Bilateral oophorectomy. No significant free fluid. Bones/Musculoskeletal: No acute osseous abnormality.  IMPRESSION:  1. Status post hysterectomy and bilateral oophorectomy. No evidence of recurrent or metastatic disease.  2. Right liver lobe hemangiomas, as before.  She also had a chest x-ray that was negative. Lab work was obtained including a CBC and chemistries. They were all normal with exception of her potassium was 5.4. She is completely asymptomatic. She's not taking any potassium supplementation.  No new medical problems and her family.  Interval History:   Review of Systems There has been no change in her bowel or bladder habits.The low back pain does not radiate . She denies any weakness or numbness. This back pain as stated before has occurred previously but has been self-limited. This pain has not there continuously. She does feel like it comes and goes. It does not wake her up at night. She has neuropathy in her feet. She does notice everyday but it does not limit her activities or significantly interfere with her quality of life. None in her hands. 10 point review of systems is otherwise negative.  Current Meds:  Outpatient Encounter Prescriptions as of 06/06/2013  Medication Sig  . meloxicam (MOBIC) 15 MG tablet Take 15 mg by mouth daily.  . ramipril (ALTACE) 10 MG capsule Take 1 capsule (10 mg total) by mouth daily.  Marland Kitchen docusate sodium (COLACE) 100 MG capsule Take 100 mg by mouth as needed.     Allergy: No Known Allergies  Social Hx:   History   Social History  . Marital Status: Married    Spouse Name: N/A    Number of Children: N/A  . Years of Education: N/A   Occupational History  . Not on file.   Social History Main Topics  . Smoking  status: Never Smoker   . Smokeless tobacco: Never Used  . Alcohol Use: 0.5 oz/week    1 drink(s) per week     Comment: Occas  . Drug Use: No  . Sexual Activity: No   Other Topics Concern  . Not on file   Social History Narrative   exercsie at [employer] 4-6 times a week.    Diet: good    Past Surgical Hx:  Past Surgical History  Procedure Laterality Date  . Abdominal hysterectomy  05-08-10    LAPAROSCOPIC ROBOTIC ASSISSTED TOTAL HYSTERECTOMY WITH BSO , BILAT. PELVIC AND PERIAORTIC LYMPH NODE EVAL. BY DR. Alycia Rossetti AT [facility]  . Cesarean section  01/08/80 11/21/82    x2    Past Medical Hx:  Past Medical History  Diagnosis Date  . Hypertension   . Osteoarthritis     HX OF SOME  OSTEOARTHRITIS  . Cancer of uterine body dx'd 04/14/10    radical hysterectomy, chemotherapy and radiation therapy    Oncology Hx:    Cancer of uterus   05/08/2010 Initial Diagnosis Cancer of uterus, IIIC1    - 12/01/2010 Chemotherapy completed chemo therapy with 6 cycles of paclitaxel and carboplatin and vaginal cuff brachytherapy on GOG 258    Family Hx:  Family History  Problem Relation Age of Onset  . Pancreatic cancer Father   . Diabetes Brother   . Colon cancer Maternal Grandfather     Vitals:  Blood pressure 129/73, pulse 67, temperature 97.8 F (36.6 C), temperature source Oral, resp. rate 16, height 5\' 7"  (1.702 m), weight 193 lb 1.6 oz (87.59 kg).  Physical Exam: CHEST: Clear to auscultation.   HEART: Regular rate and rhythm.   LUNGS: Clear to ascultation bilaterally.  LYMPH NODES: No cervical, supraclavicular, inguinal adenopathy.   ABDOMEN: Soft, obese, nontender. No evidence of hernia. No tenderness.   BACK: No CVA tenderness.  GROIN: No lymphadenopathy.  GU: Vagina is atrophic. No lesions. Pap smear submitted without difficulty.  No masses appreciated. Internal examination, no cul-de-sac masses. No nodularity in the rectovaginal septum.   RECTAL: Good anal sphincter tone without  any masses.    Assessment/Plan: IMPRESSION: Melissa [name] has completed definitive treatment for her stage IIIC1 grade 3 endometrial cancer.  She will follow up in 6 months as per the protocol. All of the patient's questions were answered.   Melissa [name] A. Alycia Rossetti, MD 06/06/2013, 11:22 AM

## 2013-06-12 ENCOUNTER — Telehealth: Payer: Self-pay | Admitting: *Deleted

## 2013-06-12 NOTE — Telephone Encounter (Signed)
Called pt with results, unable to reach lmovm Pap smear results normal. Request call back with any concerns.

## 2013-06-12 NOTE — Telephone Encounter (Signed)
Message copied by Charlei Ramsaran, Aletha Halim on Tue Jun 12, 2013  9:10 AM ------      Message from: Joylene John D      Created: Tue Jun 12, 2013  9:02 AM       Please let her know her pap smear is normal.            ----- Message -----         From: Lab in [facility] Interface         Sent: 06/11/2013   5:24 PM           To: Dorothyann Gibbs, NP                   ------

## 2013-06-13 ENCOUNTER — Other Ambulatory Visit: Payer: Self-pay | Admitting: *Deleted

## 2013-06-13 DIAGNOSIS — C55 Malignant neoplasm of uterus, part unspecified: Secondary | ICD-10-CM

## 2013-06-14 ENCOUNTER — Ambulatory Visit
Admission: RE | Admit: 2013-06-14 | Discharge: 2013-06-14 | Disposition: A | Discharge status: Home / Self Care | Payer: Self-pay | Source: Ambulatory Visit

## 2013-06-14 DIAGNOSIS — Z1231 Encounter for screening mammogram for malignant neoplasm of breast: Secondary | ICD-10-CM

## 2013-08-28 ENCOUNTER — Telehealth: Payer: Self-pay | Admitting: Family Medicine

## 2013-08-28 DIAGNOSIS — I10 Essential (primary) hypertension: Secondary | ICD-10-CM

## 2013-08-28 DIAGNOSIS — E786 Lipoprotein deficiency: Secondary | ICD-10-CM

## 2013-08-28 NOTE — Telephone Encounter (Signed)
Relevant patient education assigned to patient using Emmi. ° °

## 2013-08-28 NOTE — Telephone Encounter (Signed)
Message copied by Jinny Sanders on Tue Aug 28, 2013  8:35 AM ------      Message from: Ellamae Sia      Created: Tue Aug 21, 2013 12:21 PM      Regarding: Lab orders for Thursday, 7.9.15       Patient is scheduled for CPX labs, please order future labs, Thanks , Terri       ------

## 2013-08-30 ENCOUNTER — Other Ambulatory Visit (INDEPENDENT_AMBULATORY_CARE_PROVIDER_SITE_OTHER): Payer: Federal, State, Local not specified - PPO

## 2013-08-30 DIAGNOSIS — I10 Essential (primary) hypertension: Secondary | ICD-10-CM

## 2013-08-30 LAB — LIPID PANEL
Cholesterol: 161 mg/dL (ref 0–200)
HDL: 40.6 mg/dL (ref >39.00)
LDL Cholesterol: 99 mg/dL (ref 0–99)
NonHDL: 120.4
TRIGLYCERIDES: 105 mg/dL (ref 0.0–149.0)
Total CHOL/HDL Ratio: 4
VLDL: 21 mg/dL (ref 0.0–40.0)

## 2013-08-30 LAB — COMPREHENSIVE METABOLIC PANEL
ALT: 19 U/L (ref 0–35)
AST: 24 U/L (ref 0–37)
Albumin: 4.1 g/dL (ref 3.5–5.2)
Alkaline Phosphatase: 76 U/L (ref 39–117)
BILIRUBIN TOTAL: 0.6 mg/dL (ref 0.2–1.2)
BUN: 16 mg/dL (ref 6–23)
CALCIUM: 9.6 mg/dL (ref 8.4–10.5)
CHLORIDE: 105 meq/L (ref 96–112)
CO2: 30 meq/L (ref 19–32)
CREATININE: 0.7 mg/dL (ref 0.4–1.2)
GFR: 97.15 mL/min (ref >60.00)
Glucose, Bld: 101 mg/dL — ABNORMAL HIGH (ref 70–99)
Potassium: 5.4 mEq/L — ABNORMAL HIGH (ref 3.5–5.1)
Sodium: 141 mEq/L (ref 135–145)
Total Protein: 7.2 g/dL (ref 6.0–8.3)

## 2013-09-06 ENCOUNTER — Ambulatory Visit (INDEPENDENT_AMBULATORY_CARE_PROVIDER_SITE_OTHER): Payer: Federal, State, Local not specified - PPO | Admitting: Family Medicine

## 2013-09-06 ENCOUNTER — Encounter: Payer: Self-pay | Admitting: Family Medicine

## 2013-09-06 VITALS — BP 150/90 | HR 61 | Temp 98.0°F | Ht 66.0 in | Wt 190.2 lb

## 2013-09-06 DIAGNOSIS — E875 Hyperkalemia: Secondary | ICD-10-CM | POA: Insufficient documentation

## 2013-09-06 DIAGNOSIS — Z0000 Encounter for general adult medical examination without abnormal findings: Secondary | ICD-10-CM

## 2013-09-06 DIAGNOSIS — I10 Essential (primary) hypertension: Secondary | ICD-10-CM

## 2013-09-06 LAB — POTASSIUM: Potassium: 4.5 mEq/L (ref 3.5–5.1)

## 2013-09-06 MED ORDER — MELOXICAM 15 MG PO TABS: 15.0000 mg | ORAL_TABLET | Freq: Every day | ORAL | Status: DC

## 2013-09-06 MED ORDER — RAMIPRIL 5 MG PO CAPS: 5.0000 mg | ORAL_CAPSULE | Freq: Every day | ORAL | Status: DC

## 2013-09-06 NOTE — Progress Notes (Signed)
58 year old female. The patient is here for annual wellness exam and preventative care.   Hypertension: Poor control today no longer on ramipril in last 3-4 months, pt reports she gets anxious at MD BP Readings from Last 3 Encounters:  09/06/13 150/90  06/06/13 129/73  11/30/12 145/81  Using medication without problems or lightheadedness: Had some low BP with exercise and low BP. Chest pain with exertion:None  Edema: None  Short of breath:None  Average home BPs: At home 126/81-140/90  Other issues:  Wt Readings from Last 3 Encounters:  09/06/13 190 lb 4 oz (86.297 kg)  06/06/13 193 lb 1.6 oz (87.59 kg)  11/30/12 194 lb 12.8 oz (88.361 kg)    Reviewed labs in detail, Cholesterol well controlled and LDL at goal <130.  Lab Results  Component Value Date   CHOL 161 08/30/2013   HDL 40.60 08/30/2013   LDLCALC 99 08/30/2013   TRIG 105.0 08/30/2013   CHOLHDL 4 08/30/2013   High potassium : she has now been trying to avoid high potassium foods.   B knee pain well controlled on meloxicam as needed, uses few times a month.   History    Social History   .  Marital Status:  Married     Spouse Name:  N/A     Number of Children:  N/A   .  Years of Education:  N/A    Social History Main Topics   .  Smoking status:  Never Smoker   .  Smokeless tobacco:  Never Used   .  Alcohol Use:  0.5 oz/week     1 drink(s) per week      Occas   .  Drug Use:  No   .  Sexually Active:  No    Other Topics  Concern   .  None    Social History Narrative    exercsie at [employer] 4-6 times a week. Diet: good   Review of Systems  Constitutional: Negative for fever, fatigue and unexpected weight change.  HENT: Negative for ear pain, congestion, sore throat, sneezing, trouble swallowing and sinus pressure.  Eyes: Negative for pain and itching.  Respiratory: Negative for cough, shortness of breath and wheezing.  Cardiovascular: Negative for chest pain, palpitations and leg swelling.  Gastrointestinal: Negative  for nausea, abdominal pain, diarrhea, constipation and blood in stool.  Genitourinary: Negative for dysuria, hematuria, vaginal discharge, difficulty urinating and menstrual problem.  Skin: Negative for rash.  Neurological: Negative for syncope, weakness, light-headedness, numbness and headaches.  Psychiatric/Behavioral: Negative for confusion and dysphoric mood. The patient is not nervous/anxious.  Objective:   Physical Exam  Constitutional: Vital signs are normal. She appears well-developed and well-nourished. She is cooperative. Non-toxic appearance. She does not appear ill. No distress.  HENT:  Head: Normocephalic.  Right Ear: Hearing, tympanic membrane, external ear and ear canal normal.  Left Ear: Hearing, tympanic membrane, external ear and ear canal normal.  Nose: Nose normal.  Eyes: Conjunctivae, EOM and lids are normal. Pupils are equal, round, and reactive to light. No foreign bodies found.  Neck: Trachea normal and normal range of motion. Neck supple. Carotid bruit is not present. No mass and no thyromegaly present.  Cardiovascular: Normal rate, regular rhythm, S1 normal, S2 normal, normal heart sounds and intact distal pulses. Exam reveals no gallop.  No murmur heard.  Pulmonary/Chest: Effort normal and breath sounds normal. No respiratory distress. She has no wheezes. She has no rhonchi. She has no rales.  Abdominal: Soft.  Normal appearance and bowel sounds are normal. She exhibits no distension, no fluid wave, no abdominal bruit and no mass. There is no hepatosplenomegaly. There is no tenderness. There is no rebound, no guarding and no CVA tenderness. No hernia.  Lymphadenopathy:  She has no cervical adenopathy.  She has no axillary adenopathy.  Neurological: She is alert. She has normal strength. No cranial nerve deficit or sensory deficit.  Skin: Skin is warm, dry and intact. No rash noted.  Psychiatric: Her speech is normal and behavior is normal. Judgment normal. Her mood  appears not anxious. Cognition and memory are normal. She does not exhibit a depressed mood.  Breast: no masses, no nipple discharge B  Pelvic per GYN  Assessment & Plan:   The patient's preventative maintenance and recommended screening tests for an annual wellness exam were reviewed in full today.  Brought up to date unless services declined.  Counselled on the importance of diet, exercise, and its role in overall health and mortality.  The patient's FH and SH was reviewed, including their home life, tobacco status, and drug and alcohol status.   PAP/GYN: hx of endometrial cancer, s/s total abdominal hysterectomy, pap per Gyn onc, Pelvic occuring every six months  Vaccines: uptodate with Td  Colon: Dr. Fuller Plan. Nml 2013, recommended repeat in 5 year.Due in 2018. Mammogram:06/14/2013 3 D mammo  nml... heterogenously dense breasts.Marland Kitchen  DEXA: mother with osteoporosis, no other risk factors...plan to check at age 54. For now ca, vit D in diet and exercise.

## 2013-09-06 NOTE — Assessment & Plan Note (Signed)
Will re-eval off ramipril and on low potassium diet.

## 2013-09-06 NOTE — Progress Notes (Signed)
Pre visit review using our clinic review tool, if applicable. No additional management support is needed unless otherwise documented below in the visit note. 

## 2013-09-06 NOTE — Patient Instructions (Addendum)
Stop at lab on way out for potassium check. Restart ramipril. Return in 7-10 days to check BMET, kidney function back on ramipril.  Keep up good work on healthy eating, low carb diet, exercise and continued weight loss.

## 2013-09-06 NOTE — Assessment & Plan Note (Signed)
If potassium back normal will restart lower dose ramipril, and recheck Cr and  K in 7-10 days.  Encouraged exercise, weight loss, healthy eating habits.

## 2013-09-17 ENCOUNTER — Other Ambulatory Visit (INDEPENDENT_AMBULATORY_CARE_PROVIDER_SITE_OTHER): Payer: Federal, State, Local not specified - PPO

## 2013-09-17 DIAGNOSIS — E875 Hyperkalemia: Secondary | ICD-10-CM

## 2013-09-17 LAB — BASIC METABOLIC PANEL
BUN: 16 mg/dL (ref 6–23)
CHLORIDE: 104 meq/L (ref 96–112)
CO2: 32 meq/L (ref 19–32)
CREATININE: 0.8 mg/dL (ref 0.4–1.2)
Calcium: 9.3 mg/dL (ref 8.4–10.5)
GFR: 77.79 mL/min (ref >60.00)
GLUCOSE: 91 mg/dL (ref 70–99)
POTASSIUM: 4.5 meq/L (ref 3.5–5.1)
Sodium: 140 mEq/L (ref 135–145)

## 2013-10-08 ENCOUNTER — Other Ambulatory Visit: Payer: Self-pay | Admitting: Gynecologic Oncology

## 2013-11-13 ENCOUNTER — Encounter: Payer: Self-pay | Admitting: Family Medicine

## 2013-11-13 ENCOUNTER — Ambulatory Visit (INDEPENDENT_AMBULATORY_CARE_PROVIDER_SITE_OTHER): Payer: Federal, State, Local not specified - PPO | Admitting: Family Medicine

## 2013-11-13 VITALS — BP 128/86 | HR 69 | Temp 98.4°F | Ht 66.0 in | Wt 192.2 lb

## 2013-11-13 DIAGNOSIS — M25511 Pain in right shoulder: Secondary | ICD-10-CM

## 2013-11-13 DIAGNOSIS — M25519 Pain in unspecified shoulder: Secondary | ICD-10-CM

## 2013-11-13 NOTE — Assessment & Plan Note (Signed)
Likely rotator cuff tendonitis.  Treat with NSAIDs, home PT. If not improving consider steroid injeciton.

## 2013-11-13 NOTE — Patient Instructions (Signed)
Start meloxicam daily x 1-2 weeks. Call if no improvement in next week and we can try a different anti-inflammatory like diclofenac etc. Start home PT.  If no improvement in 2 week call for appt with Dr. Lorelei Pont [facility].

## 2013-11-13 NOTE — Progress Notes (Signed)
Pre visit review using our clinic review tool, if applicable. No additional management support is needed unless otherwise documented below in the visit note. 

## 2013-11-13 NOTE — Progress Notes (Signed)
   Subjective:    Patient ID: Melissa [name], female    DOB: 06-28-1955, 58 y.o.   MRN: [id number]  HPI   58 year old female presents with  new onset  Pain in right shoulder x 2 months.  occurred after yanking on door. She has been limiting motion.  She sleeps on this side at night, slept on it wrong. Woke her up at night. Pain is on lateral upper arm and shoulder. No numbness, no tingling. No weakness in hand. No pain in neck, no worsening of pain with neck movement.  She has tried meloxicam, advil prn.  No history of arm or shoulder issues. Review of Systems     Objective:   Physical Exam  Constitutional: Vital signs are normal. She appears well-developed and well-nourished. She is cooperative.  Non-toxic appearance. She does not appear ill. No distress.  HENT:  Head: Normocephalic.  Right Ear: Hearing, tympanic membrane, external ear and ear canal normal. Tympanic membrane is not erythematous, not retracted and not bulging.  Left Ear: Hearing, tympanic membrane, external ear and ear canal normal. Tympanic membrane is not erythematous, not retracted and not bulging.  Nose: No mucosal edema or rhinorrhea. Right sinus exhibits no maxillary sinus tenderness and no frontal sinus tenderness. Left sinus exhibits no maxillary sinus tenderness and no frontal sinus tenderness.  Mouth/Throat: Uvula is midline, oropharynx is clear and moist and mucous membranes are normal.  Eyes: Conjunctivae, EOM and lids are normal. Pupils are equal, round, and reactive to light. Lids are everted and swept, no foreign bodies found.  Neck: Trachea normal and normal range of motion. Neck supple. Carotid bruit is not present. No mass and no thyromegaly present.  Cardiovascular: Normal rate, regular rhythm, S1 normal, S2 normal, normal heart sounds, intact distal pulses and normal pulses.  Exam reveals no gallop and no friction rub.   No murmur heard. Pulmonary/Chest: Effort normal and breath sounds  normal. Not tachypneic. No respiratory distress. She has no decreased breath sounds. She has no wheezes. She has no rhonchi. She has no rales.  Abdominal: Soft. Normal appearance and bowel sounds are normal. There is no tenderness.  Musculoskeletal:       Right shoulder: She exhibits decreased range of motion and tenderness. She exhibits no bony tenderness and no spasm.  Positive neer's empty can, neg drop arm  PAin with int and ext rotation and abduciton  neg spurling Bilaterally.  Neurological: She is alert.  Skin: Skin is warm, dry and intact. No rash noted.  Psychiatric: Her speech is normal and behavior is normal. Judgment and thought content normal. Her mood appears not anxious. Cognition and memory are normal. She does not exhibit a depressed mood.          Assessment & Plan:

## 2013-11-20 ENCOUNTER — Other Ambulatory Visit: Payer: Federal, State, Local not specified - PPO

## 2013-11-20 DIAGNOSIS — C55 Malignant neoplasm of uterus, part unspecified: Secondary | ICD-10-CM

## 2013-11-20 LAB — COMPREHENSIVE METABOLIC PANEL (CC13)
ALK PHOS: 75 U/L (ref 40–150)
ALT: 15 U/L (ref 0–55)
ANION GAP: 6 meq/L (ref 3–11)
AST: 16 U/L (ref 5–34)
Albumin: 3.8 g/dL (ref 3.5–5.0)
BILIRUBIN TOTAL: 0.54 mg/dL (ref 0.20–1.20)
BUN: 12.9 mg/dL (ref 7.0–26.0)
CO2: 28 meq/L (ref 22–29)
CREATININE: 0.8 mg/dL (ref 0.6–1.1)
Calcium: 9.5 mg/dL (ref 8.4–10.4)
Chloride: 108 mEq/L (ref 98–109)
Glucose: 92 mg/dl (ref 70–140)
Potassium: 5.1 mEq/L (ref 3.5–5.1)
Sodium: 142 mEq/L (ref 136–145)
Total Protein: 6.9 g/dL (ref 6.4–8.3)

## 2013-11-20 LAB — CBC WITH DIFFERENTIAL/PLATELET
BASO%: 0.6 % (ref 0.0–2.0)
Basophils Absolute: 0 10*3/uL (ref 0.0–0.1)
EOS%: 1.7 % (ref 0.0–7.0)
Eosinophils Absolute: 0.1 10*3/uL (ref 0.0–0.5)
HEMATOCRIT: 39.3 % (ref 34.8–46.6)
HGB: 12.7 g/dL (ref 11.6–15.9)
LYMPH%: 25.6 % (ref 14.0–49.7)
MCH: 28.3 pg (ref 25.1–34.0)
MCHC: 32.4 g/dL (ref 31.5–36.0)
MCV: 87.6 fL (ref 79.5–101.0)
MONO#: 0.4 10*3/uL (ref 0.1–0.9)
MONO%: 6 % (ref 0.0–14.0)
NEUT#: 4.6 10*3/uL (ref 1.5–6.5)
NEUT%: 66.1 % (ref 38.4–76.8)
PLATELETS: 213 10*3/uL (ref 145–400)
RBC: 4.49 10*6/uL (ref 3.70–5.45)
RDW: 13.2 % (ref 11.2–14.5)
WBC: 7 10*3/uL (ref 3.9–10.3)
lymph#: 1.8 10*3/uL (ref 0.9–3.3)

## 2013-11-22 ENCOUNTER — Encounter (HOSPITAL_COMMUNITY): Payer: Self-pay

## 2013-11-22 ENCOUNTER — Ambulatory Visit (HOSPITAL_COMMUNITY)
Admission: RE | Admit: 2013-11-22 | Discharge: 2013-11-22 | Disposition: A | Discharge status: Home / Self Care | Payer: Federal, State, Local not specified - PPO | Source: Ambulatory Visit | Attending: Gynecologic Oncology | Admitting: Gynecologic Oncology

## 2013-11-22 DIAGNOSIS — Z923 Personal history of irradiation: Secondary | ICD-10-CM | POA: Diagnosis not present

## 2013-11-22 DIAGNOSIS — Z9221 Personal history of antineoplastic chemotherapy: Secondary | ICD-10-CM | POA: Insufficient documentation

## 2013-11-22 DIAGNOSIS — C55 Malignant neoplasm of uterus, part unspecified: Secondary | ICD-10-CM | POA: Diagnosis present

## 2013-11-22 MED ORDER — IOHEXOL 300 MG/ML  SOLN
100.0000 mL | Freq: Once | INTRAMUSCULAR | Status: AC | PRN
  Administered 2013-11-22: 100 mL via INTRAVENOUS

## 2013-11-29 ENCOUNTER — Other Ambulatory Visit: Payer: Federal, State, Local not specified - PPO

## 2013-11-29 ENCOUNTER — Encounter: Payer: Self-pay | Admitting: Gynecologic Oncology

## 2013-11-29 ENCOUNTER — Ambulatory Visit: Payer: Federal, State, Local not specified - PPO | Attending: Gynecologic Oncology | Admitting: Gynecologic Oncology

## 2013-11-29 ENCOUNTER — Other Ambulatory Visit (HOSPITAL_COMMUNITY)
Admission: RE | Admit: 2013-11-29 | Discharge: 2013-11-29 | Disposition: A | Discharge status: Home / Self Care | Payer: Federal, State, Local not specified - PPO | Source: Ambulatory Visit | Attending: Gynecologic Oncology | Admitting: Gynecologic Oncology

## 2013-11-29 VITALS — BP 132/88 | HR 62 | Temp 98.1°F | Resp 28 | Ht 66.0 in | Wt 190.1 lb

## 2013-11-29 DIAGNOSIS — Z90722 Acquired absence of ovaries, bilateral: Secondary | ICD-10-CM | POA: Diagnosis not present

## 2013-11-29 DIAGNOSIS — Z800 Family history of malignant neoplasm of digestive organs: Secondary | ICD-10-CM | POA: Diagnosis not present

## 2013-11-29 DIAGNOSIS — Z79899 Other long term (current) drug therapy: Secondary | ICD-10-CM | POA: Diagnosis not present

## 2013-11-29 DIAGNOSIS — C541 Malignant neoplasm of endometrium: Secondary | ICD-10-CM

## 2013-11-29 DIAGNOSIS — I10 Essential (primary) hypertension: Secondary | ICD-10-CM | POA: Diagnosis not present

## 2013-11-29 DIAGNOSIS — Z8542 Personal history of malignant neoplasm of other parts of uterus: Secondary | ICD-10-CM | POA: Insufficient documentation

## 2013-11-29 DIAGNOSIS — Z08 Encounter for follow-up examination after completed treatment for malignant neoplasm: Secondary | ICD-10-CM | POA: Diagnosis not present

## 2013-11-29 DIAGNOSIS — Z90710 Acquired absence of both cervix and uterus: Secondary | ICD-10-CM | POA: Insufficient documentation

## 2013-11-29 DIAGNOSIS — Z01411 Encounter for gynecological examination (general) (routine) with abnormal findings: Secondary | ICD-10-CM | POA: Diagnosis present

## 2013-11-29 DIAGNOSIS — Z923 Personal history of irradiation: Secondary | ICD-10-CM | POA: Insufficient documentation

## 2013-11-29 DIAGNOSIS — Z9221 Personal history of antineoplastic chemotherapy: Secondary | ICD-10-CM | POA: Diagnosis not present

## 2013-11-29 NOTE — Patient Instructions (Signed)
Return to clinic in 6 months. We'll notify you of the results of your Pap smear. Congratulations on your negative chest x-ray and CT scan!

## 2013-11-29 NOTE — Progress Notes (Signed)
Consult Note: Gyn-Onc  Melissa [name] 58 y.o. female  CC:  Chief Complaint  Patient presents with  . Follow-up    HPI: REASON FOR VISIT: Melissa [name] presents today following completion of GOG 258, for treatment of stage IIIC1 endometrial cancer.   HISTORY OF PRESENT ILLNESS: This is a 58 year old, who presented to Dr. Harolyn Rutherford with complaints of watery vaginal discharge. Endometrial biopsy demonstrated presence of a grade 2 endometrial cancer. On May 08, 2010, she underwent surgical staging and final pathology noted a grade 3 tumor with 73% myometrial invasion. There was lymphovascular space involvement in 1 of 25 lymph nodes, is noted to be positive. The positive node was within the pelvis. The periaortic lymph nodes were negative. After discussion with the patient regarding her treatment option, she elected to participate in GOG 2008. She has randomized treatment chemotherapy and chemotherapy radiation, followed by chemotherapy. She completed the first 3 cycles of chemotherapy and external beam vaginal cuff brachytherapy. She has subsequently received an additional 3 cycles of Taxol and carboplatin therapy.  PS is 0. She had a protocol required CT scan 4/13 and 10/13 that were negative for any evidence of disease. She had a CT scan as well as CXR for protocol purposes in 4 /14 that were both negative for recurrent disease. 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 I last saw her April 2015. At that time her exam was negative. Her Pap smear was also negative at that time.  Protocol required CT scan was performed 11/22/13 that revealed:  FINDINGS:  Lower chest: Visualization of the lower thorax demonstrates no consolidative or nodular pulmonary opacities. Normal heart size.  Hepatobiliary: Liver is normal in size and contour. Stable low-attenuation lesions within the peripheral  right hepatic lobe previously characterized as hemangiomas. The gallbladder is unremarkable. No intrahepatic or extrahepatic biliary ductal dilatation.  Pancreas: Unremarkable  Spleen: Unremarkable  Adrenals/Urinary Tract: Adrenal glands are unremarkable. Kidneys enhance symmetrically with contrast. No hydronephrosis. Urinary bladder is unremarkable.  Stomach/Bowel: No abnormal bowel wall thickening or evidence for bowel obstruction. No free fluid or free intraperitoneal air. Stool is present throughout the colon.  Vascular/Lymphatic: Normal caliber abdominal aorta. No retroperitoneal lymphadenopathy.  Reproductive: Status post hysterectomy.  Other: No evidence for omental peritoneal disease.  Musculoskeletal: No aggressive or acute appearing osseous lesions.  IMPRESSION:  Status post hysterectomy and bilateral oophorectomy. No evidence for recurrent or metastatic disease.  CXR was also negative.  No new medical problems and her family.  Interval History:  She is overall doing very well and really has no complaints. She did have a mammogram in April of 2015 with one year followup.  Review of Systems Constitutional: Denies fever. Skin: No rash Cardiovascular: No chest pain, shortness of breath Pulmonary: No cough  Gastro Intestinal: No nausea, vomiting, constipation, or diarrhea reported. No bright red blood per rectum or change in bowel movement.  Genitourinary: Denies vaginal bleeding and discharge.  Psychology: No changes  Current Meds:  Outpatient Encounter Prescriptions as of 11/29/2013  Medication Sig  . docusate sodium (COLACE) 100 MG capsule Take 100 mg by mouth as needed.   . meloxicam (MOBIC) 15 MG tablet Take 1 tablet (15 mg total) by mouth daily.  . ramipril (ALTACE) 5 MG capsule Take 1 capsule (5 mg total) by mouth daily.    Allergy: No Known Allergies  Social Hx:   History  Social History  . Marital Status: Married    Spouse Name: N/A    Number of Children:  N/A  . Years of Education: N/A   Occupational History  . Not on file.   Social History Main Topics  . Smoking status: Never Smoker   . Smokeless tobacco: Never Used  . Alcohol Use: 0.5 oz/week    1 drink(s) per week     Comment: Occas  . Drug Use: No  . Sexual Activity: No   Other Topics Concern  . Not on file   Social History Narrative   exercsie at [facility] 4-6 times a week.    Diet: good    Past Surgical Hx:  Past Surgical History  Procedure Laterality Date  . Abdominal hysterectomy  05-08-10    LAPAROSCOPIC ROBOTIC ASSISSTED TOTAL HYSTERECTOMY WITH BSO , BILAT. PELVIC AND PERIAORTIC LYMPH NODE EVAL. BY DR. Alycia Rossetti AT [facility]  . Cesarean section  01/08/80 11/21/82    x2    Past Medical Hx:  Past Medical History  Diagnosis Date  . Hypertension   . Osteoarthritis     HX OF SOME  OSTEOARTHRITIS  . Cancer of uterine body dx'd 04/14/10    radical hysterectomy, chemotherapy and radiation therapy    Oncology Hx:    Cancer of uterus   05/08/2010 Initial Diagnosis Cancer of uterus, IIIC1    - 12/01/2010 Chemotherapy completed chemo therapy with 6 cycles of paclitaxel and carboplatin and vaginal cuff brachytherapy on GOG 258    Family Hx:  Family History  Problem Relation Age of Onset  . Pancreatic cancer Father   . Diabetes Brother   . Colon cancer Maternal Grandfather     Vitals:  Blood pressure 132/88, pulse 62, temperature 98.1 F (36.7 C), temperature source Oral, resp. rate 28, height 5\' 6"  (1.676 m), weight 190 lb 1.6 oz (86.229 kg).  Physical Exam: CHEST: Clear to auscultation.   HEART: Regular rate and rhythm.   LUNGS: Clear to ascultation bilaterally.  LYMPH NODES: No cervical, supraclavicular, inguinal adenopathy.   ABDOMEN: Soft, obese, nontender. No evidence of hernia. No tenderness.   BACK: No CVA tenderness.  GROIN: No lymphadenopathy.  GU: Vagina is atrophic. No lesions. She as telengectasias. Pap smear submitted without difficulty.  No masses  appreciated. Internal examination, no cul-de-sac masses. No nodularity in the rectovaginal septum.   RECTAL: Good anal sphincter tone without any masses.    Assessment/Plan: IMPRESSION: Melissa [name] has completed definitive treatment for her stage IIIC1 grade 3 endometrial cancer in 2012.  We'll notify her of the results of her Pap smear. She will follow up in 6 months as per the protocol. All of the patient's questions were answered.   Aune Adami A., MD 11/29/2013, 9:33 AM

## 2013-12-04 LAB — CYTOLOGY - PAP

## 2013-12-06 ENCOUNTER — Telehealth: Payer: Self-pay | Admitting: *Deleted

## 2013-12-06 NOTE — Telephone Encounter (Signed)
Message copied by Christa See on Thu Dec 06, 2013 10:13 AM ------      Message from: CROSS, MELISSA D      Created: Thu Dec 06, 2013  9:58 AM       Please let her know that her pap smear is normal.  Thank you!            Melissa       ----- Message -----         From: Lab in [facility] Interface         Sent: 12/05/2013   1:16 PM           To: Dorothyann Gibbs, NP                   ------

## 2013-12-06 NOTE — Telephone Encounter (Signed)
Called and spoke with pt as noted below by [facility] and let her know pap smear was completely normal. Pt appreciative of call.

## 2014-04-12 ENCOUNTER — Other Ambulatory Visit: Payer: Self-pay | Admitting: Family Medicine

## 2014-04-12 NOTE — Telephone Encounter (Signed)
Last office visit 11/13/2013.  Ok to refill? 

## 2014-04-25 ENCOUNTER — Other Ambulatory Visit (HOSPITAL_COMMUNITY)
Admission: RE | Admit: 2014-04-25 | Discharge: 2014-04-25 | Disposition: A | Discharge status: Home / Self Care | Payer: Federal, State, Local not specified - PPO | Source: Ambulatory Visit | Attending: Gynecologic Oncology | Admitting: Gynecologic Oncology

## 2014-04-25 ENCOUNTER — Encounter: Payer: Self-pay | Admitting: Gynecologic Oncology

## 2014-04-25 ENCOUNTER — Ambulatory Visit: Payer: Federal, State, Local not specified - PPO | Attending: Gynecologic Oncology | Admitting: Gynecologic Oncology

## 2014-04-25 VITALS — BP 125/78 | HR 66 | Temp 97.5°F | Resp 20 | Ht 66.0 in | Wt 187.6 lb

## 2014-04-25 DIAGNOSIS — Z01411 Encounter for gynecological examination (general) (routine) with abnormal findings: Secondary | ICD-10-CM | POA: Insufficient documentation

## 2014-04-25 DIAGNOSIS — K5900 Constipation, unspecified: Secondary | ICD-10-CM

## 2014-04-25 DIAGNOSIS — C541 Malignant neoplasm of endometrium: Secondary | ICD-10-CM | POA: Diagnosis present

## 2014-04-25 NOTE — Progress Notes (Signed)
Consult Note: Gyn-Onc  Melissa [name] 59 y.o. female  CC:  Chief Complaint  Patient presents with  . Endometrial Cancer    HPI: REASON FOR VISIT: [name] presents today following completion of GOG 258, for treatment of stage IIIC1 endometrial cancer.   HISTORY OF PRESENT ILLNESS: This is a 59 year old, who presented to Dr. Harolyn Rutherford with complaints of watery vaginal discharge. Endometrial biopsy demonstrated presence of a grade 2 endometrial cancer. On May 08, 2010, she underwent surgical staging and final pathology noted a grade 3 tumor with 73% myometrial invasion. There was lymphovascular space involvement in 1 of 25 lymph nodes, is noted to be positive. The positive node was within the pelvis. The periaortic lymph nodes were negative. After discussion with the patient regarding her treatment option, she elected to participate in GOG 2008. She has randomized treatment chemotherapy and chemotherapy radiation, followed by chemotherapy. She completed the first 3 cycles of chemotherapy and external beam vaginal cuff brachytherapy. She has subsequently received an additional 3 cycles of Taxol and carboplatin therapy.  PS is 0. She had a protocol required CT scan 4/13 and 10/13 that were negative for any evidence of disease. She had a CT scan as well as CXR for protocol purposes in 4 /14 that were both negative for recurrent disease. 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 I last saw her October 2015. At that time her exam was negative. Her Pap smear was also negative at that time.  Protocol required CT scan was performed 11/22/13 that revealed:  FINDINGS:  Lower chest: Visualization of the lower thorax demonstrates no consolidative or nodular pulmonary opacities. Normal heart size.  Hepatobiliary: Liver is normal in size and contour. Stable low-attenuation lesions within the  peripheral right hepatic lobe previously characterized as hemangiomas. The gallbladder is unremarkable. No intrahepatic or extrahepatic biliary ductal dilatation.  Pancreas: Unremarkable  Spleen: Unremarkable  Adrenals/Urinary Tract: Adrenal glands are unremarkable. Kidneys enhance symmetrically with contrast. No hydronephrosis. Urinary bladder is unremarkable.  Stomach/Bowel: No abnormal bowel wall thickening or evidence for bowel obstruction. No free fluid or free intraperitoneal air. Stool is present throughout the colon.  Vascular/Lymphatic: Normal caliber abdominal aorta. No retroperitoneal lymphadenopathy.  Reproductive: Status post hysterectomy.  Other: No evidence for omental peritoneal disease.  Musculoskeletal: No aggressive or acute appearing osseous lesions.  IMPRESSION:  Status post hysterectomy and bilateral oophorectomy. No evidence for recurrent or metastatic disease.  CXR was also negative.  No new medical problems and her family.  Interval History:  She is overall doing very well and really has no complaints. She did have a mammogram in April of 2015 with one year followup. She is primary complaining of constipation. She will have a bowel movement occasionally only every 5 days. She stopped her Colace as she was "immune to it". She does feel it she drinks enough fluid she's not sure she takes enough fiber in her diet. Her last colonoscopy was in 2013 and they recommend follow-up in 5 years. She occasionally has some bleeding from her hemorrhoids but otherwise no change in stool caliber or blood around the stools. She occasionally has some right lower quadrant pain. There is no specific pattern. She's not sure if it's related to her bowels. She will experience every few days. It does not wake her up at night.  Review of Systems Constitutional: Denies fever. Skin: No rash Cardiovascular: No chest  pain, shortness of breath Pulmonary: No cough  Gastro Intestinal: No nausea,  vomiting, + constipation, no diarrhea reported.  Genitourinary: Denies vaginal bleeding and discharge.  Psychology: No changes  Current Meds:  Outpatient Encounter Prescriptions as of 04/25/2014  Medication Sig  . meloxicam (MOBIC) 15 MG tablet Take 15 mg by mouth as needed.  . ramipril (ALTACE) 5 MG capsule Take 1 capsule (5 mg total) by mouth daily.  Marland Kitchen docusate sodium (COLACE) 100 MG capsule Take 100 mg by mouth as needed.   . [DISCONTINUED] meloxicam (MOBIC) 15 MG tablet TAKE 1 TABLET (15 MG TOTAL) BY MOUTH DAILY. (Patient not taking: Reported on 04/25/2014)    Allergy: No Known Allergies  Social Hx:   History   Social History  . Marital Status: Married    Spouse Name: N/A  . Number of Children: N/A  . Years of Education: N/A   Occupational History  . Not on file.   Social History Main Topics  . Smoking status: Never Smoker   . Smokeless tobacco: Never Used  . Alcohol Use: 0.5 oz/week    1 Standard drinks or equivalent per week     Comment: Occas  . Drug Use: No  . Sexual Activity: No   Other Topics Concern  . Not on file   Social History Narrative   exercsie at [employer] [employer] [facility] [employer] [facility] 4-6 times a week.    Diet: good    Past Surgical Hx:  Past Surgical History  Procedure Laterality Date  . Abdominal hysterectomy  05-08-10    LAPAROSCOPIC ROBOTIC ASSISSTED TOTAL HYSTERECTOMY WITH BSO , BILAT. PELVIC AND PERIAORTIC LYMPH NODE EVAL. BY DR. Alycia Rossetti AT [facility]  . Cesarean section  01/08/80 11/21/82    x2    Past Medical Hx:  Past Medical History  Diagnosis Date  . Hypertension   . Osteoarthritis     HX OF SOME  OSTEOARTHRITIS  . Cancer of uterine body dx'd 04/14/10    radical hysterectomy, chemotherapy and radiation therapy    Oncology Hx:    Cancer of uterus   05/08/2010 Initial Diagnosis Cancer of uterus, IIIC1    - 12/01/2010 Chemotherapy completed chemo therapy with 6 cycles of paclitaxel and carboplatin and vaginal cuff brachytherapy on GOG 258    Family Hx:  Family History   Problem Relation Age of Onset  . Pancreatic cancer Father   . Diabetes Brother   . Colon cancer Maternal Grandfather     Vitals:  Blood pressure 125/78, pulse 66, temperature 97.5 F (36.4 C), temperature source Oral, resp. rate 20, height 5\' 6"  (1.676 m), weight 187 lb 9.6 oz (85.095 kg).  Physical Exam: CHEST: Clear to auscultation.   HEART: Regular rate and rhythm.   LUNGS: Clear to ascultation bilaterally.  LYMPH NODES: No cervical, supraclavicular, inguinal adenopathy.   ABDOMEN: Soft, obese, nontender. No evidence of hernia. No tenderness.   BACK: No CVA tenderness.  GROIN: No lymphadenopathy.  GU: Vagina is atrophic. No lesions. She has telengectasias. Pap smear submitted without difficulty.  No masses appreciated. Internal examination, no cul-de-sac masses. No nodularity in the rectovaginal septum.   RECTAL: Good anal sphincter tone without any masses.    Assessment/Plan: IMPRESSION: Ms. Farace has completed definitive treatment for her stage IIIC1 grade 3 endometrial cancer in 2012.  We'll notify her of the results of her Pap smear. She will follow up in 6 months as per the protocol. All of the patient's questions were answered.   She will ensure that she gets enough  fiber in her diet. We discussed using marijuana ask. I ordered her CT scan and she was given her CT scan prep. We will follow-up in results for Pap smear from today. She'll return to see [place] in 6 months for protocol directed visit, CBC, chemistries, CT scan as well as chest x-ray. If her constipation does not improve on its own and neck few months. She was encouraged to follow-up regarding this issue with her primary care physician.  Melissa Esperanza A., MD 04/25/2014, 12:14 PM

## 2014-04-25 NOTE — Patient Instructions (Signed)
Return to see [place] in 6 months. At that time he'll need a chest x-ray, CT scan of the abdomen and pelvis and labs per the protocol.  CT Scan A computed tomography (CT) scan is a specialized X-ray scan. It uses X-rays and a computer to make pictures of different areas of your body. A CT scan can offer more detailed information than a regular X-ray exam. The CT scan provides data about internal organs, soft tissue structures, blood vessels, and bones.  The CT scanner is a large machine that takes pictures of your body as you move through the opening.  LET [facility] CARE PROVIDER KNOW ABOUT:  Any allergies you have.   All medicines you are taking, including vitamins, herbs, eye drops, creams, and over-the-counter medicines.   Previous problems you or members of your family have had with the use of anesthetics.   Any blood disorders you have.   Previous surgeries you have had.   Medical conditions you have. RISKS AND COMPLICATIONS  Generally, this is a safe procedure. However, as with any procedure, problems can occur. Possible problems include:   An allergic reaction to the contrast material.   Development of cancer from excessive exposure to radiation. The risk of this is small.  BEFORE THE PROCEDURE   The day before the test, stop drinking caffeinated beverages. These include energy drinks, tea, soda, coffee, and hot chocolate.   On the day of the test:  About 4 hours before the test, stop eating and drinking anything but water as advised by your health care provider.   Avoid wearing jewelry. You will have to partly or fully undress and wear a hospital gown. PROCEDURE   You will be asked to lie on a table with your arms above your head.   If contrast dye is to be used for the test, an IV tube will be inserted in your arm. The contrast dye will be injected into the IV tube. You might feel warm, or you may get a metallic taste in your mouth.   The table you will be  lying on will move into a large machine that will do the scanning.   You will be able to see, hear, and talk to the person running the machine while you are in it. Follow that person's directions.   The CT machine will move around you to take pictures. Do not move while it is scanning. This helps to get a good image.   When the best possible pictures have been taken, the machine will be turned off. The table will be moved out of the machine. The IV tube will then be removed. AFTER THE PROCEDURE  Ask your health care provider when to follow up for your test results. Document Released: 03/18/2004 Document Revised: 02/13/2013 Document Reviewed: 10/16/2012 [facility] Patient Information 2015 [place], [place]. This information is not intended to replace advice given to you by your health care provider. Make sure you discuss any questions you have with your health care provider.

## 2014-04-30 LAB — CYTOLOGY - PAP

## 2014-05-01 ENCOUNTER — Telehealth: Payer: Self-pay | Admitting: *Deleted

## 2014-05-01 NOTE — Telephone Encounter (Signed)
Notified pt of results, pt verbalized understanding. No concerns at this time. 

## 2014-05-01 NOTE — Telephone Encounter (Signed)
-----   Message from Dorothyann Gibbs, NP sent at 04/30/2014  3:12 PM EST ----- Please let her know her pap smear is normal.  Thanks  ----- Message -----    From: Lab in [facility] Interface    Sent: 04/30/2014  11:42 AM      To: Dorothyann Gibbs, NP

## 2014-05-02 ENCOUNTER — Other Ambulatory Visit: Payer: Self-pay | Admitting: Gynecologic Oncology

## 2014-05-02 DIAGNOSIS — C55 Malignant neoplasm of uterus, part unspecified: Secondary | ICD-10-CM

## 2014-05-20 ENCOUNTER — Other Ambulatory Visit: Payer: Self-pay | Admitting: *Deleted

## 2014-05-20 MED ORDER — RAMIPRIL 5 MG PO CAPS: 5.0000 mg | ORAL_CAPSULE | Freq: Every day | ORAL | Status: DC

## 2014-05-24 ENCOUNTER — Other Ambulatory Visit: Payer: Self-pay

## 2014-05-24 DIAGNOSIS — Z1231 Encounter for screening mammogram for malignant neoplasm of breast: Secondary | ICD-10-CM

## 2014-06-26 ENCOUNTER — Ambulatory Visit: Payer: Federal, State, Local not specified - PPO

## 2014-06-28 ENCOUNTER — Ambulatory Visit
Admission: RE | Admit: 2014-06-28 | Discharge: 2014-06-28 | Disposition: A | Discharge status: Home / Self Care | Payer: Federal, State, Local not specified - PPO | Source: Ambulatory Visit

## 2014-06-28 DIAGNOSIS — Z1231 Encounter for screening mammogram for malignant neoplasm of breast: Secondary | ICD-10-CM

## 2014-08-27 ENCOUNTER — Encounter: Payer: Self-pay | Admitting: Gastroenterology

## 2014-09-25 ENCOUNTER — Ambulatory Visit: Payer: Federal, State, Local not specified - PPO | Admitting: Gynecologic Oncology

## 2014-10-18 ENCOUNTER — Telehealth: Payer: Self-pay

## 2014-10-18 ENCOUNTER — Other Ambulatory Visit: Payer: Self-pay | Admitting: Family Medicine

## 2014-10-18 DIAGNOSIS — E786 Lipoprotein deficiency: Secondary | ICD-10-CM

## 2014-10-18 NOTE — Telephone Encounter (Signed)
Pt left v/m; pt has appt for labs at [facility] on 10/21/14; on 10/22/14 pt is scheduled for labs at [facility]; pt wants tomake sure same labs are not repeated on 10/22/14 and pt request cb.

## 2014-10-18 NOTE — Telephone Encounter (Signed)
Let pt know. Looks like other oiffice is doing CMET and CBC. We will only check lipids here. She can come here for that or if she wants she can ask other office to draw lipids as well if they are aggreeable. If so she can cancel our lab appt. I will put in an order.

## 2014-10-18 NOTE — Telephone Encounter (Signed)
Melissa [name] notified as instructed by telephone.  Lab appointment for here cancelled.

## 2014-10-21 ENCOUNTER — Other Ambulatory Visit (HOSPITAL_BASED_OUTPATIENT_CLINIC_OR_DEPARTMENT_OTHER): Payer: Federal, State, Local not specified - PPO

## 2014-10-21 ENCOUNTER — Ambulatory Visit (HOSPITAL_COMMUNITY): Payer: Federal, State, Local not specified - PPO

## 2014-10-21 ENCOUNTER — Telehealth: Payer: Self-pay | Admitting: Family Medicine

## 2014-10-21 ENCOUNTER — Ambulatory Visit (HOSPITAL_COMMUNITY)
Admission: RE | Admit: 2014-10-21 | Discharge: 2014-10-21 | Disposition: A | Discharge status: Home / Self Care | Payer: Federal, State, Local not specified - PPO | Source: Ambulatory Visit | Attending: Gynecologic Oncology | Admitting: Gynecologic Oncology

## 2014-10-21 ENCOUNTER — Encounter (HOSPITAL_COMMUNITY): Payer: Self-pay

## 2014-10-21 ENCOUNTER — Telehealth: Payer: Self-pay | Admitting: Gynecologic Oncology

## 2014-10-21 DIAGNOSIS — C55 Malignant neoplasm of uterus, part unspecified: Secondary | ICD-10-CM

## 2014-10-21 DIAGNOSIS — D1803 Hemangioma of intra-abdominal structures: Secondary | ICD-10-CM | POA: Insufficient documentation

## 2014-10-21 DIAGNOSIS — C541 Malignant neoplasm of endometrium: Secondary | ICD-10-CM

## 2014-10-21 DIAGNOSIS — E786 Lipoprotein deficiency: Secondary | ICD-10-CM

## 2014-10-21 LAB — CBC WITH DIFFERENTIAL/PLATELET
BASO%: 0.4 % (ref 0.0–2.0)
Basophils Absolute: 0 10*3/uL (ref 0.0–0.1)
EOS%: 1.1 % (ref 0.0–7.0)
Eosinophils Absolute: 0.1 10*3/uL (ref 0.0–0.5)
HEMATOCRIT: 39.6 % (ref 34.8–46.6)
HGB: 13 g/dL (ref 11.6–15.9)
LYMPH#: 1.6 10*3/uL (ref 0.9–3.3)
LYMPH%: 28.9 % (ref 14.0–49.7)
MCH: 29.1 pg (ref 25.1–34.0)
MCHC: 32.8 g/dL (ref 31.5–36.0)
MCV: 88.6 fL (ref 79.5–101.0)
MONO#: 0.3 10*3/uL (ref 0.1–0.9)
MONO%: 5.1 % (ref 0.0–14.0)
NEUT#: 3.6 10*3/uL (ref 1.5–6.5)
NEUT%: 64.5 % (ref 38.4–76.8)
PLATELETS: 197 10*3/uL (ref 145–400)
RBC: 4.47 10*6/uL (ref 3.70–5.45)
RDW: 12.4 % (ref 11.2–14.5)
WBC: 5.5 10*3/uL (ref 3.9–10.3)

## 2014-10-21 LAB — COMPREHENSIVE METABOLIC PANEL (CC13)
ALBUMIN: 4.1 g/dL (ref 3.5–5.0)
ALT: 15 U/L (ref 0–55)
AST: 17 U/L (ref 5–34)
Alkaline Phosphatase: 71 U/L (ref 40–150)
Anion Gap: 8 mEq/L (ref 3–11)
BILIRUBIN TOTAL: 0.54 mg/dL (ref 0.20–1.20)
BUN: 16.2 mg/dL (ref 7.0–26.0)
CALCIUM: 9.4 mg/dL (ref 8.4–10.4)
CO2: 26 mEq/L (ref 22–29)
CREATININE: 0.8 mg/dL (ref 0.6–1.1)
Chloride: 107 mEq/L (ref 98–109)
EGFR: 86 mL/min/{1.73_m2} — ABNORMAL LOW (ref >90)
Glucose: 95 mg/dl (ref 70–140)
Potassium: 5 mEq/L (ref 3.5–5.1)
Sodium: 141 mEq/L (ref 136–145)
TOTAL PROTEIN: 7 g/dL (ref 6.4–8.3)

## 2014-10-21 MED ORDER — IOHEXOL 300 MG/ML  SOLN
100.0000 mL | Freq: Once | INTRAMUSCULAR | Status: AC | PRN
  Administered 2014-10-21: 100 mL via INTRAVENOUS

## 2014-10-21 NOTE — Telephone Encounter (Signed)
-----   Message from Marchia Bond sent at 10/16/2014  2:19 PM EDT ----- Regarding: Cpx labs 8/30, need orderd please :-) Please order  future cpx labs for pt's upcoming lab appt. Thanks Aniceto Boss

## 2014-10-21 NOTE — Telephone Encounter (Signed)
Patient informed of CT scan results and lab results.  Advised to incorporate Miralax daily to help relieve stool burden.  Advised to call for any questions or concerns.  Plans to see the patient this Friday or sooner if needed.

## 2014-10-22 ENCOUNTER — Other Ambulatory Visit: Payer: Federal, State, Local not specified - PPO

## 2014-10-23 ENCOUNTER — Encounter: Payer: Self-pay | Admitting: Gynecologic Oncology

## 2014-10-23 ENCOUNTER — Other Ambulatory Visit (HOSPITAL_COMMUNITY)
Admission: RE | Admit: 2014-10-23 | Discharge: 2014-10-23 | Disposition: A | Discharge status: Home / Self Care | Payer: Federal, State, Local not specified - PPO | Source: Ambulatory Visit | Attending: Gynecologic Oncology | Admitting: Gynecologic Oncology

## 2014-10-23 ENCOUNTER — Ambulatory Visit: Payer: Federal, State, Local not specified - PPO | Attending: Gynecologic Oncology | Admitting: Gynecologic Oncology

## 2014-10-23 VITALS — BP 121/71 | HR 69 | Temp 98.1°F | Resp 20 | Ht 66.0 in | Wt 186.7 lb

## 2014-10-23 DIAGNOSIS — C541 Malignant neoplasm of endometrium: Secondary | ICD-10-CM

## 2014-10-23 DIAGNOSIS — Z01411 Encounter for gynecological examination (general) (routine) with abnormal findings: Secondary | ICD-10-CM | POA: Diagnosis not present

## 2014-10-23 NOTE — Patient Instructions (Addendum)
Return to see Dr. Alycia Rossetti in 6 months.  Please call closer to the date to schedule your appt.

## 2014-10-23 NOTE — Progress Notes (Signed)
Consult Note: Gyn-Onc  [name] 59 y.o. female  CC:  Chief Complaint  Patient presents with  . endometrial cancer    followup    HPI: REASON FOR VISIT: Ms. Nekayla Heider presents today following completion of GOG 258, for treatment of stage IIIC1 endometrial cancer.   HISTORY OF PRESENT ILLNESS: This is a 59 year old, who presented to Dr. Harolyn Rutherford with complaints of watery vaginal discharge. Endometrial biopsy demonstrated presence of a grade 2 endometrial cancer. On May 08, 2010, she underwent surgical staging and final pathology noted a grade 3 tumor with 73% myometrial invasion. There was lymphovascular space involvement in 1 of 25 lymph nodes, is noted to be positive. The positive node was within the pelvis. The periaortic lymph nodes were negative. After discussion with the patient regarding her treatment option, she elected to participate in GOG 2008. She has randomized treatment chemotherapy and chemotherapy radiation, followed by chemotherapy. She completed the first 3 cycles of chemotherapy and external beam vaginal cuff brachytherapy. She has subsequently received an additional 3 cycles of Taxol and carboplatin therapy.  PS is 0. She had a protocol required CT scan 4/13 and 10/13 that were negative for any evidence of disease. She had a CT scan as well as CXR for protocol purposes in 4 /14 that were both negative for recurrent disease. 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 I last saw her 3/16. At that time her exam was negative. Her Pap smear was also negative at that time.  Protocol required CT scan was performed 11/22/13 that revealed:  FINDINGS:  Lower chest: Visualization of the lower thorax demonstrates no consolidative or nodular pulmonary opacities. Normal heart size.  Hepatobiliary: Liver is normal in size and contour. Stable low-attenuation lesions within  the peripheral right hepatic lobe previously characterized as hemangiomas. The gallbladder is unremarkable. No intrahepatic or extrahepatic biliary ductal dilatation.  Pancreas: Unremarkable  Spleen: Unremarkable  Adrenals/Urinary Tract: Adrenal glands are unremarkable. Kidneys enhance symmetrically with contrast. No hydronephrosis. Urinary bladder is unremarkable.  Stomach/Bowel: No abnormal bowel wall thickening or evidence for bowel obstruction. No free fluid or free intraperitoneal air. Stool is present throughout the colon.  Vascular/Lymphatic: Normal caliber abdominal aorta. No retroperitoneal lymphadenopathy.  Reproductive: Status post hysterectomy.  Other: No evidence for omental peritoneal disease.  Musculoskeletal: No aggressive or acute appearing osseous lesions.  IMPRESSION:  Status post hysterectomy and bilateral oophorectomy. No evidence for recurrent or metastatic disease.  CXR was also negative.  Interval History:  She is overall doing very well and really has no complaints. Her son has been diagnosed with Crohn's disease and is going to be undergoing surgery in September. She and her husband are going up for that. The operative field and her feet is the same as it was before primarily in her toes and the balls of her feet. The right lower quadrant discomfort that she complained of when I saw her in March is no longer present.  CT 8/16: IMPRESSION: No evidence of recurrent or metastatic carcinoma within the abdomen or pelvis.  Stable small benign hepatic hemangiomas.  CXR 8/16: FINDINGS: The lungs are adequately inflated. There is no focal infiltrate. There is no pleural effusion. The heart is normal in size. The pulmonary vascularity is not engorged. The mediastinum is normal in width. The bony thorax is unremarkable. IMPRESSION: There is no active cardiopulmonary disease.  Review of Systems Constitutional: Denies  fever. Skin: No rash Cardiovascular: No chest pain,  shortness of breath Pulmonary: No cough  Gastro Intestinal: No nausea, vomiting, irregular bowels "had it all my life" Genitourinary: Denies vaginal bleeding and discharge.  Psychology: No changes  Current Meds:  Outpatient Encounter Prescriptions as of 10/23/2014  Medication Sig  . docusate sodium (COLACE) 100 MG capsule Take 100 mg by mouth as needed.   . meloxicam (MOBIC) 15 MG tablet Take 15 mg by mouth as needed.  . ramipril (ALTACE) 5 MG capsule Take 1 capsule (5 mg total) by mouth daily.   No facility-administered encounter medications on file as of 10/23/2014.    Allergy: No Known Allergies  Social Hx:   Social History   Social History  . Marital Status: Married    Spouse Name: N/A  . Number of Children: N/A  . Years of Education: N/A   Occupational History  . Not on file.   Social History Main Topics  . Smoking status: Never Smoker   . Smokeless tobacco: Never Used  . Alcohol Use: 0.5 oz/week    1 Standard drinks or equivalent per week     Comment: Occas  . Drug Use: No  . Sexual Activity: No   Other Topics Concern  . Not on file   Social History Narrative   exercsie at [facility] 4-6 times a week.    Diet: good    Past Surgical Hx:  Past Surgical History  Procedure Laterality Date  . Abdominal hysterectomy  05-08-10    LAPAROSCOPIC ROBOTIC ASSISSTED TOTAL HYSTERECTOMY WITH BSO , BILAT. PELVIC AND PERIAORTIC LYMPH NODE EVAL. BY DR. Alycia Rossetti AT [facility]  . Cesarean section  01/08/80 11/21/82    x2    Past Medical Hx:  Past Medical History  Diagnosis Date  . Hypertension   . Osteoarthritis     HX OF SOME  OSTEOARTHRITIS  . Cancer of uterine body dx'd 04/14/10    radical hysterectomy, chemotherapy and radiation therapy    Oncology Hx:    Cancer of uterus   05/08/2010 Initial Diagnosis Cancer of uterus, IIIC1    - 12/01/2010 Chemotherapy completed chemo therapy with 6 cycles of paclitaxel and carboplatin and vaginal cuff brachytherapy on GOG 258    Family  Hx:  Family History  Problem Relation Age of Onset  . Pancreatic cancer Father   . Diabetes Brother   . Colon cancer Maternal Grandfather     Vitals:  Blood pressure 121/71, pulse 69, temperature 98.1 F (36.7 C), temperature source Oral, resp. rate 20, height 5\' 6"  (1.676 m), weight 186 lb 11.2 oz (84.687 kg).  Physical Exam:  Well-nourished well-developed female in no acute distress.  NECK: Supple, no lymphadenopathy, no thyromegaly  CHEST: Clear to auscultation.   HEART: Regular rate and rhythm.   LUNGS: Clear to ascultation bilaterally.  LYMPH NODES: No cervical, supraclavicular, inguinal adenopathy.   ABDOMEN: Soft, obese, nontender. No evidence of hernia. No tenderness.   GROIN: No lymphadenopathy.  GU: Vagina is atrophic. No lesions. She has telengectasias. Pap smear submitted without difficulty.  No masses appreciated. Internal examination, no cul-de-sac masses. No nodularity in the rectovaginal septum.   RECTAL: Good anal sphincter tone without any masses.    Assessment/Plan: IMPRESSION: Ms. Mccalip is a very pleasant 59 year old who has completed definitive treatment for her stage IIIC1 grade 3 endometrial cancer in 2012.  We'll notify her of the results of her Pap smear. She will follow up in 6 months as per the protocol. She is  very pleased with the results of her CT scan, blood work, and chest x-ray . All of the patient's questions were answered.     Lancelot Alyea A., MD 10/23/2014, 1:27 PM

## 2014-10-25 ENCOUNTER — Ambulatory Visit: Payer: Federal, State, Local not specified - PPO | Admitting: Gynecologic Oncology

## 2014-10-29 ENCOUNTER — Ambulatory Visit (INDEPENDENT_AMBULATORY_CARE_PROVIDER_SITE_OTHER): Payer: Federal, State, Local not specified - PPO | Admitting: Family Medicine

## 2014-10-29 ENCOUNTER — Encounter: Payer: Self-pay | Admitting: Family Medicine

## 2014-10-29 ENCOUNTER — Telehealth: Payer: Self-pay | Admitting: Nurse Practitioner

## 2014-10-29 VITALS — BP 122/70 | HR 57 | Temp 97.6°F | Ht 65.5 in | Wt 187.2 lb

## 2014-10-29 DIAGNOSIS — I10 Essential (primary) hypertension: Secondary | ICD-10-CM

## 2014-10-29 DIAGNOSIS — Z0000 Encounter for general adult medical examination without abnormal findings: Secondary | ICD-10-CM

## 2014-10-29 DIAGNOSIS — E2839 Other primary ovarian failure: Secondary | ICD-10-CM | POA: Diagnosis not present

## 2014-10-29 DIAGNOSIS — Z8262 Family history of osteoporosis: Secondary | ICD-10-CM | POA: Diagnosis not present

## 2014-10-29 DIAGNOSIS — Z23 Encounter for immunization: Secondary | ICD-10-CM | POA: Diagnosis not present

## 2014-10-29 LAB — CYTOLOGY - PAP

## 2014-10-29 MED ORDER — MELOXICAM 15 MG PO TABS: 15.0000 mg | ORAL_TABLET | ORAL | Status: DC | PRN

## 2014-10-29 NOTE — Patient Instructions (Addendum)
Stop at front desk to set up bone density.  Call  If you want to set up hep C screening of lipids.  Keep working on healthy eating and regular exercise.

## 2014-10-29 NOTE — Progress Notes (Signed)
59 year old female. The patient is here for annual wellness exam and preventative care.    Doing well overall.  Hypertension:Well controlled on Ramipril 5 mg daily BP Readings from Last 3 Encounters:  10/29/14 122/70  10/23/14 121/71  04/25/14 125/78  Using medication without problems or lightheadedness:  Chest pain with exertion:None  Edema: None  Short of breath:None  Average home BPs: At home 115/68  Other issues:  Wt Readings from Last 3 Encounters:  10/29/14 187 lb 4 oz (84.936 kg)  10/23/14 186 lb 11.2 oz (84.687 kg)  04/25/14 187 lb 9.6 oz (85.095 kg)   Reviewed labs in detail. Did not have chol check this year by mistake.. Will do next year. Lab Results  Component Value Date   CHOL 161 08/30/2013   HDL 40.60 08/30/2013   LDLCALC 99 08/30/2013   TRIG 105.0 08/30/2013   CHOLHDL 4 08/30/2013   B knee pain well controlled on meloxicam as needed Right shoulder pain, resolved.   History    Social History   .  Marital Status:  Married     Spouse Name:  N/A     Number of Children:  N/A   .  Years of Education:  N/A    Social History Main Topics   .  Smoking status:  Never Smoker   .  Smokeless tobacco:  Never Used   .  Alcohol Use:  0.5 oz/week     1 drink(s) per week      Occas   .  Drug Use:  No   .  Sexually Active:  No    Other Topics  Concern   .  None    Social History Narrative    exercsie at [employer] 4-6 times a week. Diet: good   ROS   Objective:   Physical Exam  Constitutional: Vital signs are normal. She appears well-developed and well-nourished. She is cooperative. Non-toxic appearance. She does not appear ill. No distress.  HENT:  Head: Normocephalic.  Right Ear: Hearing, tympanic membrane, external ear and ear canal normal.  Left Ear: Hearing, tympanic membrane, external ear and ear canal normal.  Nose: Nose normal.  Eyes: Conjunctivae, EOM and lids are normal. Pupils are  equal, round, and reactive to light. No foreign bodies found.  Neck: Trachea normal and normal range of motion. Neck supple. Carotid bruit is not present. No mass and no thyromegaly present.  Cardiovascular: Normal rate, regular rhythm, S1 normal, S2 normal, normal heart sounds and intact distal pulses. Exam reveals no gallop.  No murmur heard.  Pulmonary/Chest: Effort normal and breath sounds normal. No respiratory distress. She has no wheezes. She has no rhonchi. She has no rales.  Abdominal: Soft. Normal appearance and bowel sounds are normal. She exhibits no distension, no fluid wave, no abdominal bruit and no mass. There is no hepatosplenomegaly. There is no tenderness. There is no rebound, no guarding and no CVA tenderness. No hernia.  Lymphadenopathy:  She has no cervical adenopathy.  She has no axillary adenopathy.  Neurological: She is alert. She has normal strength. No cranial nerve deficit or sensory deficit.  Skin: Skin is warm, dry and intact. No rash noted.  Psychiatric: Her speech is normal and behavior is normal. Judgment normal. Her mood appears not anxious. Cognition and memory are normal. She does not exhibit a depressed mood.  Breast: no masses, no nipple discharge B  Pelvic per GYN  Assessment & Plan:   The patient's preventative maintenance and recommended screening  tests for an annual wellness exam were reviewed in full today.  Brought up to date unless services declined.  Counselled on the importance of diet, exercise, and its role in overall health and mortality.  The patient's FH and SH was reviewed, including their home life, tobacco status, and drug and alcohol status.   PAP/GYN: hx of endometrial cancer, s/s total abdominal hysterectomy, pap per Gyn onc Dr. Alycia Rossetti, Pelvic occuring every six months  Vaccines: uptodate with Td, refused flu and given shingles Colon: Dr. Fuller Plan. Nml 2013, recommended repeat in 5 year. Due in 2018. Mammogram: 06/2014 3 D  mammo nml... heterogenously dense breasts.Marland Kitchen  DEXA: mother with osteoporosis, no other risk factors..Due For now ca, vit D in diet and exercise. Nonsmoker Hep C screen: will schedule HIV: refused.

## 2014-10-29 NOTE — Progress Notes (Signed)
Pre visit review using our clinic review tool, if applicable. No additional management support is needed unless otherwise documented below in the visit note. 

## 2014-10-29 NOTE — Telephone Encounter (Signed)
Per Joylene John, NP, Rn called patient to inform her of normal PAP. Husband Lennette Bihari received result and states he will tell her immediately. He is informed to have her call clinic with any questions or concerns. He verbalizes understanding.

## 2014-10-31 NOTE — Assessment & Plan Note (Signed)
Well controlled. Continue current medication.  

## 2014-11-07 ENCOUNTER — Telehealth: Payer: Self-pay

## 2014-11-07 NOTE — Telephone Encounter (Signed)
Pt had missed call from [facility] [phone]; pt called that # and was advised to call PCP; spoke with Butch Penny CMA and pt care coordinator and I do not know who called pt; pt will wait for cb.

## 2014-12-02 ENCOUNTER — Other Ambulatory Visit: Payer: Federal, State, Local not specified - PPO

## 2014-12-04 ENCOUNTER — Ambulatory Visit (INDEPENDENT_AMBULATORY_CARE_PROVIDER_SITE_OTHER)
Admission: RE | Admit: 2014-12-04 | Discharge: 2014-12-04 | Disposition: A | Discharge status: Home / Self Care | Payer: Federal, State, Local not specified - PPO | Source: Ambulatory Visit | Attending: Family Medicine | Admitting: Family Medicine

## 2014-12-04 DIAGNOSIS — Z8262 Family history of osteoporosis: Secondary | ICD-10-CM

## 2014-12-04 DIAGNOSIS — E2839 Other primary ovarian failure: Secondary | ICD-10-CM | POA: Diagnosis not present

## 2014-12-06 ENCOUNTER — Encounter: Payer: Self-pay | Admitting: Family Medicine

## 2014-12-06 DIAGNOSIS — M8580 Other specified disorders of bone density and structure, unspecified site: Secondary | ICD-10-CM

## 2014-12-06 DIAGNOSIS — M810 Age-related osteoporosis without current pathological fracture: Secondary | ICD-10-CM | POA: Insufficient documentation

## 2015-04-24 ENCOUNTER — Telehealth: Payer: Self-pay | Admitting: Family Medicine

## 2015-04-24 ENCOUNTER — Ambulatory Visit (INDEPENDENT_AMBULATORY_CARE_PROVIDER_SITE_OTHER): Payer: Federal, State, Local not specified - PPO | Admitting: Family Medicine

## 2015-04-24 VITALS — BP 120/82 | HR 80 | Temp 97.7°F | Ht 65.5 in | Wt 192.5 lb

## 2015-04-24 DIAGNOSIS — N644 Mastodynia: Secondary | ICD-10-CM

## 2015-04-24 MED ORDER — RAMIPRIL 5 MG PO CAPS: 5.0000 mg | ORAL_CAPSULE | Freq: Every day | ORAL | Status: DC

## 2015-04-24 NOTE — Addendum Note (Signed)
Addended by: Eliezer Lofts E on: 04/24/2015 02:38 PM   Modules accepted: Orders

## 2015-04-24 NOTE — Telephone Encounter (Signed)
Please change order for mammogram to img [id number] and ultra sound to img [id number] per [facility] at [facility]

## 2015-04-24 NOTE — Progress Notes (Signed)
   Subjective:    Patient ID: Melissa [name], female    DOB: Nov 21, 1955, 60 y.o.   MRN: [id number]  HPI  60 year old female presents with new onset left lateral breast pain x several weeks. No known injury. No skin changes, no lumps. No fever, no flu like symptoms. No unexpected weight loss.   She has history of pain in breast with stress. She has been under a little more stress lately.  Last mammogram 06/2014:  Nml, but scattered areas of fibroglandular tissue.  She does drink a lot of caffeine.  Hypertension:    Due for refill of med, well contrlled.  BP Readings from Last 3 Encounters:  04/24/15 120/82  10/29/14 122/70  10/23/14 121/71    Using medication without problems or lightheadedness: None Chest pain with exertion:None Edema:None Short of breath:None Average home BPs: Other issues:   Social History /Family History/Past Medical History reviewed and updated if needed. No family histroy of breast cancer   Review of Systems  Constitutional: Negative for fever and fatigue.  HENT: Negative for ear pain.   Eyes: Negative for pain.  Respiratory: Negative for chest tightness and shortness of breath.   Cardiovascular: Negative for chest pain, palpitations and leg swelling.  Gastrointestinal: Negative for abdominal pain.  Genitourinary: Negative for dysuria.       Objective:   Physical Exam  Constitutional: Vital signs are normal. She appears well-developed and well-nourished. She is cooperative.  Non-toxic appearance. She does not appear ill. No distress.  HENT:  Head: Normocephalic.  Right Ear: Hearing, tympanic membrane, external ear and ear canal normal. Tympanic membrane is not erythematous, not retracted and not bulging.  Left Ear: Hearing, tympanic membrane, external ear and ear canal normal. Tympanic membrane is not erythematous, not retracted and not bulging.  Nose: No mucosal edema or rhinorrhea. Right sinus exhibits no maxillary sinus tenderness and no  frontal sinus tenderness. Left sinus exhibits no maxillary sinus tenderness and no frontal sinus tenderness.  Mouth/Throat: Uvula is midline, oropharynx is clear and moist and mucous membranes are normal.  Eyes: Conjunctivae, EOM and lids are normal. Pupils are equal, round, and reactive to light. Lids are everted and swept, no foreign bodies found.  Neck: Trachea normal and normal range of motion. Neck supple. Carotid bruit is not present. No thyroid mass and no thyromegaly present.  Cardiovascular: Normal rate, regular rhythm, S1 normal, S2 normal, normal heart sounds, intact distal pulses and normal pulses.  Exam reveals no gallop and no friction rub.   No murmur heard. Pulmonary/Chest: Effort normal and breath sounds normal. No tachypnea. No respiratory distress. She has no decreased breath sounds. She has no wheezes. She has no rhonchi. She has no rales.  Abdominal: Soft. Normal appearance and bowel sounds are normal. There is no tenderness.  Genitourinary: There is breast tenderness. No breast swelling, discharge or bleeding.  Tenderness left lateral breast., no mass, symmetric, no skin changes  Neurological: She is alert.  Skin: Skin is warm, dry and intact. No rash noted.  Psychiatric: Her speech is normal and behavior is normal. Judgment and thought content normal. Her mood appears not anxious. Cognition and memory are normal. She does not exhibit a depressed mood.          Assessment & Plan:

## 2015-04-24 NOTE — Progress Notes (Signed)
Pre visit review using our clinic review tool, if applicable. No additional management support is needed unless otherwise documented below in the visit note. 

## 2015-04-24 NOTE — Patient Instructions (Addendum)
Decrease caffeine.  Stop at front desk to set up studies.

## 2015-04-24 NOTE — Assessment & Plan Note (Addendum)
May be secondary to caffeine, stress.  No mass or changes noted on exam. Will refer for mammogram and [place].

## 2015-04-24 NOTE — Telephone Encounter (Signed)
Put in new orders, but cannot cancel old ones since linked with appt.

## 2015-05-01 ENCOUNTER — Ambulatory Visit
Admission: RE | Admit: 2015-05-01 | Discharge: 2015-05-01 | Disposition: A | Discharge status: Home / Self Care | Payer: Federal, State, Local not specified - PPO | Source: Ambulatory Visit | Attending: Family Medicine | Admitting: Family Medicine

## 2015-05-01 ENCOUNTER — Other Ambulatory Visit: Payer: Self-pay | Admitting: Family Medicine

## 2015-05-01 ENCOUNTER — Other Ambulatory Visit: Payer: Federal, State, Local not specified - PPO

## 2015-05-01 DIAGNOSIS — N644 Mastodynia: Secondary | ICD-10-CM

## 2015-06-04 ENCOUNTER — Encounter: Payer: Self-pay | Admitting: Gynecologic Oncology

## 2015-06-04 ENCOUNTER — Other Ambulatory Visit: Payer: Self-pay | Admitting: Gynecologic Oncology

## 2015-06-04 ENCOUNTER — Other Ambulatory Visit (HOSPITAL_COMMUNITY)
Admission: RE | Admit: 2015-06-04 | Discharge: 2015-06-04 | Disposition: A | Discharge status: Home / Self Care | Payer: Federal, State, Local not specified - PPO | Source: Ambulatory Visit | Attending: Gynecologic Oncology | Admitting: Gynecologic Oncology

## 2015-06-04 ENCOUNTER — Ambulatory Visit: Payer: Federal, State, Local not specified - PPO | Attending: Gynecologic Oncology | Admitting: Gynecologic Oncology

## 2015-06-04 VITALS — BP 133/72 | HR 65 | Temp 97.6°F | Resp 18 | Ht 65.5 in | Wt 184.6 lb

## 2015-06-04 DIAGNOSIS — Z90710 Acquired absence of both cervix and uterus: Secondary | ICD-10-CM | POA: Diagnosis not present

## 2015-06-04 DIAGNOSIS — K5090 Crohn's disease, unspecified, without complications: Secondary | ICD-10-CM | POA: Diagnosis not present

## 2015-06-04 DIAGNOSIS — Z9221 Personal history of antineoplastic chemotherapy: Secondary | ICD-10-CM | POA: Insufficient documentation

## 2015-06-04 DIAGNOSIS — R87610 Atypical squamous cells of undetermined significance on cytologic smear of cervix (ASC-US): Secondary | ICD-10-CM | POA: Diagnosis not present

## 2015-06-04 DIAGNOSIS — M8580 Other specified disorders of bone density and structure, unspecified site: Secondary | ICD-10-CM | POA: Insufficient documentation

## 2015-06-04 DIAGNOSIS — M1990 Unspecified osteoarthritis, unspecified site: Secondary | ICD-10-CM | POA: Diagnosis not present

## 2015-06-04 DIAGNOSIS — R109 Unspecified abdominal pain: Secondary | ICD-10-CM | POA: Diagnosis not present

## 2015-06-04 DIAGNOSIS — Z79899 Other long term (current) drug therapy: Secondary | ICD-10-CM | POA: Insufficient documentation

## 2015-06-04 DIAGNOSIS — Z923 Personal history of irradiation: Secondary | ICD-10-CM | POA: Insufficient documentation

## 2015-06-04 DIAGNOSIS — C541 Malignant neoplasm of endometrium: Secondary | ICD-10-CM | POA: Insufficient documentation

## 2015-06-04 DIAGNOSIS — I10 Essential (primary) hypertension: Secondary | ICD-10-CM | POA: Insufficient documentation

## 2015-06-04 DIAGNOSIS — Z01411 Encounter for gynecological examination (general) (routine) with abnormal findings: Secondary | ICD-10-CM | POA: Diagnosis not present

## 2015-06-04 NOTE — Patient Instructions (Addendum)
Please return to see Melissa [name] in 6 months. Please notify Melissa [name] of your abdominal pain increases in any way.

## 2015-06-04 NOTE — Progress Notes (Signed)
Consult Note: Gyn-Onc  Melissa [name] 60 y.o. female  CC:  Chief Complaint  Patient presents with  . endometrial cancer    MD follow up visit    HPI: REASON FOR VISIT: Melissa [name] presents today following completion of GOG 258, for treatment of stage IIIC1 endometrial cancer.   HISTORY OF PRESENT ILLNESS: This is a 60 year old, who presented to Dr. Harolyn Rutherford with complaints of watery vaginal discharge. Endometrial biopsy demonstrated presence of a grade 2 endometrial cancer. On May 08, 2010, she underwent surgical staging and final pathology noted a grade 3 tumor with 73% myometrial invasion. There was lymphovascular space involvement in 1 of 25 lymph nodes, is noted to be positive. The positive node was within the pelvis. The periaortic lymph nodes were negative. After discussion with the patient regarding her treatment option, she elected to participate in GOG 2008. She has randomized treatment chemotherapy and chemotherapy radiation, followed by chemotherapy. She completed the first 3 cycles of chemotherapy and external beam vaginal cuff brachytherapy. She has subsequently received an additional 3 cycles of Taxol and carboplatin therapy.  PS is 0. She had a protocol required CT scan 4/13 and 10/13 that were negative for any evidence of disease. She had a CT scan as well as CXR for protocol purposes in 4 /14 that were both negative for recurrent disease. 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 I last saw her 3/16. At that time her exam was negative. Her Pap smear was also negative at that time.  Protocol required CT scan was performed 11/22/13 that revealed:  FINDINGS:  Lower chest: Visualization of the lower thorax demonstrates no consolidative or nodular pulmonary opacities. Normal heart size.  Hepatobiliary: Liver is normal in size and contour. Stable low-attenuation  lesions within the peripheral right hepatic lobe previously characterized as hemangiomas. The gallbladder is unremarkable. No intrahepatic or extrahepatic biliary ductal dilatation.  Pancreas: Unremarkable  Spleen: Unremarkable  Adrenals/Urinary Tract: Adrenal glands are unremarkable. Kidneys enhance symmetrically with contrast. No hydronephrosis. Urinary bladder is unremarkable.  Stomach/Bowel: No abnormal bowel wall thickening or evidence for bowel obstruction. No free fluid or free intraperitoneal air. Stool is present throughout the colon.  Vascular/Lymphatic: Normal caliber abdominal aorta. No retroperitoneal lymphadenopathy.  Reproductive: Status post hysterectomy.  Other: No evidence for omental peritoneal disease.  Musculoskeletal: No aggressive or acute appearing osseous lesions.  IMPRESSION:  Status post hysterectomy and bilateral oophorectomy. No evidence for recurrent or metastatic disease.  CXR was also negative.  CT 8/16: IMPRESSION: No evidence of recurrent or metastatic carcinoma within the abdomen or pelvis.  Stable small benign hepatic hemangiomas.  CXR 8/16: FINDINGS: The lungs are adequately inflated. There is no focal infiltrate. There is no pleural effusion. The heart is normal in size. The pulmonary vascularity is not engorged. The mediastinum is normal in width. The bony thorax is unremarkable. IMPRESSION: There is no active cardiopulmonary disease.  Interval History:  I last saw her in August 2016. She comes in today for follow-up. She also had a negative Pap smear at that time. She's overall doing fairly well. She had a bone density study that showed some osteopenia and she started taking calcium which caused fairly significant constipation. 55 Colace she was having a lot of constipation. She then began experiencing some right-sided abdominal pain. She stopped the calcium and the pain decreased as the constipation improved but is still a little  sensitive. She  did have a colonoscopy and either 2013 in 2014. There were no polyps in the recommended follow-up in 5 years. Was diagnosed with Crohn's disease and had a successful surgery and is doing well. She is up-to-date on her mammograms. She is due this may.  Review of Systems Constitutional: Denies fever. Skin: No rash Cardiovascular: No chest pain, shortness of breath Pulmonary: No cough  Gastro Intestinal: No nausea, vomiting, some bloody mucus with bowel movements. This is been persistent since she completed radiation therapy. Her abdominal pain as above. Genitourinary: Denies vaginal bleeding and discharge.  Psychology: No changes  Current Meds:  Outpatient Encounter Prescriptions as of 06/04/2015  Medication Sig  . docusate sodium (COLACE) 100 MG capsule Take 100 mg by mouth as needed.   . meloxicam (MOBIC) 15 MG tablet Take 1 tablet (15 mg total) by mouth as needed.  . ramipril (ALTACE) 5 MG capsule Take 1 capsule (5 mg total) by mouth daily.   No facility-administered encounter medications on file as of 06/04/2015.    Allergy: No Known Allergies  Social Hx:   Social History   Social History  . Marital Status: Married    Spouse Name: N/A  . Number of Children: N/A  . Years of Education: N/A   Occupational History  . Not on file.   Social History Main Topics  . Smoking status: Never Smoker   . Smokeless tobacco: Never Used  . Alcohol Use: 0.5 oz/week    1 Standard drinks or equivalent per week     Comment: Occas  . Drug Use: No  . Sexual Activity: No   Other Topics Concern  . 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    Diet: good    Past Surgical Hx:  Past Surgical History  Procedure Laterality Date  . Abdominal hysterectomy  05-08-10    LAPAROSCOPIC ROBOTIC ASSISSTED TOTAL HYSTERECTOMY WITH BSO , BILAT. PELVIC AND PERIAORTIC LYMPH NODE EVAL. BY DR. Alycia Rossetti AT [facility]  . Cesarean section  01/08/80 11/21/82    x2    Past Medical Hx:   Past Medical History  Diagnosis Date  . Hypertension   . Osteoarthritis     HX OF SOME  OSTEOARTHRITIS  . Cancer of uterine body ([place]) dx'd 04/14/10    radical hysterectomy, chemotherapy and radiation therapy    Oncology Hx:    Cancer of uterus ([place])   05/08/2010 Initial Diagnosis Cancer of uterus, IIIC1    - 12/01/2010 Chemotherapy completed chemo therapy with 6 cycles of paclitaxel and carboplatin and vaginal cuff brachytherapy on GOG 258    Family Hx:  Family History  Problem Relation Age of Onset  . Pancreatic cancer Father   . Diabetes Brother   . Colon cancer Maternal Grandfather     Vitals:  Blood pressure 133/72, pulse 65, temperature 97.6 F (36.4 C), temperature source Oral, resp. rate 18, height 5' 5.5" (1.664 m), weight 184 lb 9.6 oz (83.734 kg), SpO2 100 %.  Physical Exam:  Well-nourished well-developed female in no acute distress.  NECK: Supple, no lymphadenopathy, no thyromegaly  CHEST: Clear to auscultation.   HEART: Regular rate and rhythm.   LUNGS: Clear to ascultation bilaterally.  LYMPH NODES: No cervical, supraclavicular, inguinal adenopathy.   ABDOMEN: Soft, obese, nontender. No evidence of hernia. No tenderness.   GROIN: No lymphadenopathy.  GU: Vagina is atrophic. No lesions. She has telengectasias status post radiation therapy. Pap smear submitted  without difficulty.  No masses appreciated. Internal examination, no cul-de-sac masses. No nodularity in the rectovaginal septum.    RECTAL: Good anal sphincter tone without any masses.    Assessment/Plan: IMPRESSION: Melissa [name] is a very pleasant 60 year old who has completed definitive treatment for her stage IIIC1 grade 3 endometrial cancer in 2012.  We'll notify her of the results of her Pap smear. She will follow up in 6 months as per the protocol. She'll return to see [place] in 6 months even though she is 5 years out from her diagnosis based on her being treated on protocol. At that time we  will go to yearly visits. Per protocol we will order a CT scan and labs at her next visit.  All of the patient's questions were answered.   She will keep track of this abdominal pain and will notify us if the pain changes in any way. If he becomes an issue that it is more persistent we will then move up her CT scan that is currently scheduled for October. We discussed having her use Tums for her calcium supplementation is that does not typically cause significant amount of constipation. She will try this.    Melissa [name] A., MD 06/04/2015, 10:41 AM

## 2015-06-06 LAB — CYTOLOGY - PAP

## 2015-06-09 ENCOUNTER — Telehealth: Payer: Self-pay | Admitting: Gynecologic Oncology

## 2015-06-09 NOTE — Telephone Encounter (Signed)
Message left for patient with pap smear results: negative.  Instructed to call for any questions or concerns.  

## 2015-06-11 DIAGNOSIS — K080 Exfoliation of teeth due to systemic causes: Secondary | ICD-10-CM | POA: Diagnosis not present

## 2015-06-12 ENCOUNTER — Other Ambulatory Visit: Payer: Self-pay

## 2015-06-12 DIAGNOSIS — Z1231 Encounter for screening mammogram for malignant neoplasm of breast: Secondary | ICD-10-CM

## 2015-06-30 ENCOUNTER — Ambulatory Visit
Admission: RE | Admit: 2015-06-30 | Discharge: 2015-06-30 | Disposition: A | Discharge status: Home / Self Care | Payer: Federal, State, Local not specified - PPO | Source: Ambulatory Visit

## 2015-06-30 DIAGNOSIS — Z1231 Encounter for screening mammogram for malignant neoplasm of breast: Secondary | ICD-10-CM

## 2015-07-15 ENCOUNTER — Ambulatory Visit (INDEPENDENT_AMBULATORY_CARE_PROVIDER_SITE_OTHER): Payer: Federal, State, Local not specified - PPO | Admitting: Family Medicine

## 2015-07-15 ENCOUNTER — Encounter: Payer: Self-pay | Admitting: Family Medicine

## 2015-07-15 VITALS — BP 126/76 | HR 67 | Temp 98.4°F | Wt 192.5 lb

## 2015-07-15 DIAGNOSIS — R221 Localized swelling, mass and lump, neck: Secondary | ICD-10-CM | POA: Insufficient documentation

## 2015-07-15 DIAGNOSIS — R222 Localized swelling, mass and lump, trunk: Secondary | ICD-10-CM | POA: Insufficient documentation

## 2015-07-15 NOTE — Progress Notes (Signed)
Pre visit review using our clinic review tool, if applicable. No additional management support is needed unless otherwise documented below in the visit note. 

## 2015-07-15 NOTE — Patient Instructions (Signed)
Great to see you.  Please stop by to see Melissa [name] on your way out. 

## 2015-07-15 NOTE — Assessment & Plan Note (Signed)
New with pt feeling right neck fullness as well. Given duration of symptoms, will proceed with CT of neck for further evaluation. The patient indicates understanding of these issues and agrees with the plan. Orders Placed This Encounter  Procedures  . CT Soft Tissue Neck Wo Contrast

## 2015-07-15 NOTE — Progress Notes (Signed)
Subjective:   Patient ID: Melissa [name], female    DOB: 06/23/1955, 60 y.o.   MRN: [id number]  Amere Novitsky is a pleasant 60 y.o. year old female pt of Dr. Diona Browner, new to me, who presents to clinic today with Edema  on 07/15/2015  HPI:  Past month, she has noticed right neck and clavicular swelling.  No pain but does "feel strange."  No difficulty swallowing.  No known injury.  H/o endometrial cancer.  Followed by Dr. Alycia Rossetti very 6 months.  Last seen on 06/04/15.  Note reviewed. Pap smear performed and advised CT scan and lab at next OV.  Current Outpatient Prescriptions on File Prior to Visit  Medication Sig Dispense Refill  . docusate sodium (COLACE) 100 MG capsule Take 100 mg by mouth as needed.     . meloxicam (MOBIC) 15 MG tablet Take 1 tablet (15 mg total) by mouth as needed. 30 tablet 5  . ramipril (ALTACE) 5 MG capsule Take 1 capsule (5 mg total) by mouth daily. 90 capsule 3   No current facility-administered medications on file prior to visit.    No Known Allergies  Past Medical History  Diagnosis Date  . Hypertension   . Osteoarthritis     HX OF SOME  OSTEOARTHRITIS  . Cancer of uterine body ([place]) dx'd 04/14/10    radical hysterectomy, chemotherapy and radiation therapy    Past Surgical History  Procedure Laterality Date  . Abdominal hysterectomy  05-08-10    LAPAROSCOPIC ROBOTIC ASSISSTED TOTAL HYSTERECTOMY WITH BSO , BILAT. PELVIC AND PERIAORTIC LYMPH NODE EVAL. BY DR. Alycia Rossetti AT [facility]  . Cesarean section  01/08/80 11/21/82    x2    Family History  Problem Relation Age of Onset  . Pancreatic cancer Father   . Diabetes Brother   . Colon cancer Maternal Grandfather     Social History   Social History  . Marital Status: Married    Spouse Name: N/A  . Number of Children: N/A  . Years of Education: N/A   Occupational History  . Not on file.   Social History Main Topics  . Smoking status: Never Smoker   . Smokeless tobacco: Never Used  .  Alcohol Use: 0.5 oz/week    1 Standard drinks or equivalent per week     Comment: Occas  . Drug Use: No  . Sexual Activity: No   Other Topics Concern  . 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    Diet: good   The PMH, PSH, Social History, Family History, Medications, and allergies have been reviewed in [facility], and have been updated if relevant.   Review of Systems  Constitutional: Negative for fever and fatigue.  HENT: Negative for sore throat, trouble swallowing and voice change.        + right neck and clavicular fullness  Respiratory: Negative.   Cardiovascular: Negative.   Endocrine: Negative.   Allergic/Immunologic: Negative.   Neurological: Negative.   Hematological: Negative.   All other systems reviewed and are negative.      Objective:    BP 126/76 mmHg  Pulse 67  Temp(Src) 98.4 F (36.9 C) (Oral)  Wt 192 lb 8 oz (87.317 kg)  SpO2 99%   Physical Exam  Constitutional: She is oriented to person, place, and time. She appears well-developed and well-nourished. No distress.  Neck: Normal range of motion. No thyroid mass and no thyromegaly present.    Cardiovascular:  Normal rate.   Pulmonary/Chest: Effort normal.  Musculoskeletal: Normal range of motion.  Neurological: She is alert and oriented to person, place, and time. No cranial nerve deficit.  Skin: Skin is warm and dry. She is not diaphoretic.  Psychiatric: She has a normal mood and affect. Her behavior is normal. Judgment and thought content normal.  Nursing note and vitals reviewed.         Assessment & Plan:   Fullness of neck - Plan: CT Soft Tissue Neck Wo Contrast  Clavicular area fullness - Plan: CT Soft Tissue Neck Wo Contrast No Follow-up on file.

## 2015-07-18 ENCOUNTER — Ambulatory Visit (HOSPITAL_COMMUNITY)
Admission: RE | Admit: 2015-07-18 | Discharge: 2015-07-18 | Disposition: A | Discharge status: Home / Self Care | Payer: Federal, State, Local not specified - PPO | Source: Ambulatory Visit | Attending: Family Medicine | Admitting: Family Medicine

## 2015-07-18 DIAGNOSIS — R938 Abnormal findings on diagnostic imaging of other specified body structures: Secondary | ICD-10-CM | POA: Insufficient documentation

## 2015-07-18 DIAGNOSIS — R221 Localized swelling, mass and lump, neck: Secondary | ICD-10-CM | POA: Diagnosis not present

## 2015-07-18 DIAGNOSIS — R222 Localized swelling, mass and lump, trunk: Secondary | ICD-10-CM

## 2015-07-18 DIAGNOSIS — S43031A Inferior subluxation of right humerus, initial encounter: Secondary | ICD-10-CM | POA: Diagnosis not present

## 2015-07-22 ENCOUNTER — Ambulatory Visit (HOSPITAL_COMMUNITY): Payer: Federal, State, Local not specified - PPO

## 2015-10-17 ENCOUNTER — Telehealth: Payer: Self-pay | Admitting: Family Medicine

## 2015-10-17 DIAGNOSIS — M8580 Other specified disorders of bone density and structure, unspecified site: Secondary | ICD-10-CM

## 2015-10-17 DIAGNOSIS — E786 Lipoprotein deficiency: Secondary | ICD-10-CM

## 2015-10-17 NOTE — Telephone Encounter (Signed)
-----   Message from Marchia Bond sent at 10/15/2015  1:15 PM EDT ----- Regarding: Cpx labs Mon 8/28, need orders. Thanks! :-) Please order  future cpx labs for pt's upcoming lab appt. Thanks Aniceto Boss

## 2015-10-20 ENCOUNTER — Other Ambulatory Visit (INDEPENDENT_AMBULATORY_CARE_PROVIDER_SITE_OTHER): Payer: Federal, State, Local not specified - PPO

## 2015-10-20 DIAGNOSIS — E786 Lipoprotein deficiency: Secondary | ICD-10-CM

## 2015-10-20 DIAGNOSIS — M8580 Other specified disorders of bone density and structure, unspecified site: Secondary | ICD-10-CM

## 2015-10-20 LAB — LIPID PANEL
CHOL/HDL RATIO: 3
Cholesterol: 149 mg/dL (ref 0–200)
HDL: 42.6 mg/dL (ref >39.00)
LDL Cholesterol: 93 mg/dL (ref 0–99)
NONHDL: 106.07
Triglycerides: 65 mg/dL (ref 0.0–149.0)
VLDL: 13 mg/dL (ref 0.0–40.0)

## 2015-10-20 LAB — COMPREHENSIVE METABOLIC PANEL
ALT: 16 U/L (ref 0–35)
AST: 19 U/L (ref 0–37)
Albumin: 4.3 g/dL (ref 3.5–5.2)
Alkaline Phosphatase: 60 U/L (ref 39–117)
BUN: 13 mg/dL (ref 6–23)
CO2: 28 meq/L (ref 19–32)
Calcium: 8.9 mg/dL (ref 8.4–10.5)
Chloride: 104 mEq/L (ref 96–112)
Creatinine, Ser: 0.7 mg/dL (ref 0.40–1.20)
GFR: 90.12 mL/min (ref >60.00)
GLUCOSE: 86 mg/dL (ref 70–99)
POTASSIUM: 4.2 meq/L (ref 3.5–5.1)
Sodium: 139 mEq/L (ref 135–145)
Total Bilirubin: 0.5 mg/dL (ref 0.2–1.2)
Total Protein: 7.1 g/dL (ref 6.0–8.3)

## 2015-10-20 LAB — VITAMIN D 25 HYDROXY (VIT D DEFICIENCY, FRACTURES): VITD: 22.42 ng/mL — AB (ref 30.00–100.00)

## 2015-10-24 ENCOUNTER — Other Ambulatory Visit: Payer: Federal, State, Local not specified - PPO

## 2015-10-31 ENCOUNTER — Ambulatory Visit (INDEPENDENT_AMBULATORY_CARE_PROVIDER_SITE_OTHER): Payer: Federal, State, Local not specified - PPO | Admitting: Family Medicine

## 2015-10-31 ENCOUNTER — Encounter: Payer: Self-pay | Admitting: Family Medicine

## 2015-10-31 VITALS — BP 125/69 | HR 63 | Temp 97.8°F | Ht 66.0 in | Wt 192.5 lb

## 2015-10-31 DIAGNOSIS — R222 Localized swelling, mass and lump, trunk: Secondary | ICD-10-CM

## 2015-10-31 DIAGNOSIS — I10 Essential (primary) hypertension: Secondary | ICD-10-CM

## 2015-10-31 DIAGNOSIS — Z114 Encounter for screening for human immunodeficiency virus [HIV]: Secondary | ICD-10-CM | POA: Diagnosis not present

## 2015-10-31 DIAGNOSIS — Z1159 Encounter for screening for other viral diseases: Secondary | ICD-10-CM

## 2015-10-31 DIAGNOSIS — S4991XD Unspecified injury of right shoulder and upper arm, subsequent encounter: Secondary | ICD-10-CM | POA: Diagnosis not present

## 2015-10-31 DIAGNOSIS — Z0000 Encounter for general adult medical examination without abnormal findings: Secondary | ICD-10-CM | POA: Diagnosis not present

## 2015-10-31 DIAGNOSIS — R221 Localized swelling, mass and lump, neck: Secondary | ICD-10-CM

## 2015-10-31 MED ORDER — MELOXICAM 15 MG PO TABS: 15.0000 mg | ORAL_TABLET | ORAL | 5 refills | Status: DC | PRN

## 2015-10-31 NOTE — Patient Instructions (Addendum)
Stop at front desk to set up referrla to ortho.  Can start daily Meloxicam for inflammation. Start vit D 400IU twice daily  Stop at lab on way out for hep C.

## 2015-10-31 NOTE — Progress Notes (Signed)
Pre visit review using our clinic review tool, if applicable. No additional management support is needed unless otherwise documented below in the visit note. 

## 2015-10-31 NOTE — Progress Notes (Signed)
60 year old female. The patient is here for annual wellness exam and preventative care.    Doing well overall.  She had CT neck to eval right clavicular swelling present x 5 months... No mass... Looked like mild subluxation of clavicle, capsular thickening.  Non tender, it has increased in size, more noticeable. Bothers her. No known injury.  Using meloxicam for knees off and on.  Hypertension:Well controlled on Ramipril 5 mg daily BP Readings from Last 3 Encounters:  10/31/15 125/69  07/15/15 126/76  06/04/15 133/72  Using medication without problems or lightheadedness:  Chest pain with exertion:None  Edema: None  Short of breath:None  Average home BPs: At home 115/68  Other issues:  Wt Readings from Last 3 Encounters:  10/31/15 192 lb 8 oz (87.3 kg)  07/15/15 192 lb 8 oz (87.3 kg)  06/04/15 184 lb 9.6 oz (83.7 kg)   Reviewed labs in detail. Lab Results  Component Value Date   CHOL 149 10/20/2015   HDL 42.60 10/20/2015   LDLCALC 93 10/20/2015   TRIG 65.0 10/20/2015   CHOLHDL 3 10/20/2015    B knee pain well controlled on meloxicam as needed    History         Social History   .  Marital Status:  Married     Spouse Name:  N/A     Number of Children:  N/A   .  Years of Education:  N/A          Social History Main Topics   .  Smoking status:  Never Smoker   .  Smokeless tobacco:  Never Used   .  Alcohol Use:  0.5 oz/week     1 drink(s) per week      Occas   .  Drug Use:  No   .  Sexually Active:  No        Other Topics  Concern   .  None       Social History Narrative    exercsie at [employer] 4-6 times a week. Diet: good   ROS Review of Systems  HENT: Negative for ear pain.   Eyes: Negative for double vision.  Respiratory: Negative for cough.   Cardiovascular: Negative for chest pain.  Gastrointestinal: Negative for heartburn.  Genitourinary: Negative for dysuria.  Musculoskeletal:  Negative for myalgias.  Skin: Negative for rash.  Neurological: Negative for weakness.  Psychiatric/Behavioral: Negative for depression.     Objective:   Physical Exam  Constitutional: Vital signs are normal. She appears well-developed and well-nourished. She is cooperative. Non-toxic appearance. She does not appear ill. No distress.  HENT:  Head: Normocephalic.  Right Ear: Hearing, tympanic membrane, external ear and ear canal normal.  Left Ear: Hearing, tympanic membrane, external ear and ear canal normal.  Nose: Nose normal.  Eyes: Conjunctivae, EOM and lids are normal. Pupils are equal, round, and reactive to light. No foreign bodies found.  Neck: Trachea normal and normal range of motion. Neck supple. Carotid bruit is not present. No mass and no thyromegaly present.  Cardiovascular: Normal rate, regular rhythm, S1 normal, S2 normal, normal heart sounds and intact distal pulses. Exam reveals no gallop.  No murmur heard.  Pulmonary/Chest: Effort normal and breath sounds normal. No respiratory distress. She has no wheezes. She has no rhonchi. She has no rales.  Abdominal: Soft. Normal appearance and bowel sounds are normal. She exhibits no distension, no fluid wave, no abdominal bruit and no mass. There is no hepatosplenomegaly. There  is no tenderness. There is no rebound, no guarding and no CVA tenderness. No hernia.  Lymphadenopathy:  She has no cervical adenopathy.  She has no axillary adenopathy.  Neurological: She is alert. She has normal strength. No cranial nerve deficit or sensory deficit.  Skin: Skin is warm, dry and intact. No rash noted.  Psychiatric: Her speech is normal and behavior is normal. Judgment normal. Her mood appears not anxious. Cognition and memory are normal. She does not exhibit a depressed mood.  Breast: no masses, no nipple discharge B  Pelvic per GYN  Assessment & Plan:   The patient's preventative maintenance and recommended screening  tests for an annual wellness exam were reviewed in full today.  Brought up to date unless services declined.  Counselled on the importance of diet, exercise, and its role in overall health and mortality.  The patient's FH and SH was reviewed, including their home life, tobacco status, and drug and alcohol status.   PAP/GYN: hx of endometrial cancer, s/s total abdominal hysterectomy, pap per Gyn onc Dr. Alycia Rossetti, Pelvic occuring every six months  Vaccines: uptodate with Td, refused flu and uptodate with shingles Colon: Dr. Fuller Plan. Nml 2013, recommended repeat in 5 year. Due in 2018. Mammogram: 06/2015 3 D mammo nml... heterogenously dense breasts.Marland Kitchen  DEXA: mother with osteoporosis, no other risk factors.. 2016 osteopenia but borderline.. Consider repeat in 2 years. Nonsmoker Hep C screen:  schedule HIV: will do

## 2015-10-31 NOTE — Assessment & Plan Note (Signed)
Evidence of chronic clavicular subluxation. No known injury. Will refer to ortho for possible steroid injection or other appropriate treatment.  In meantime pt will increase meloxicam to help with associated inflammation.

## 2015-10-31 NOTE — Assessment & Plan Note (Signed)
Well controlled. Continue current medication. Encouraged exercise, weight loss, healthy eating habits.  

## 2015-11-01 LAB — HEPATITIS C ANTIBODY: HCV AB: NEGATIVE

## 2015-11-01 LAB — HIV ANTIBODY (ROUTINE TESTING W REFLEX): HIV: NONREACTIVE

## 2015-11-21 ENCOUNTER — Other Ambulatory Visit (HOSPITAL_BASED_OUTPATIENT_CLINIC_OR_DEPARTMENT_OTHER): Payer: Federal, State, Local not specified - PPO

## 2015-11-21 DIAGNOSIS — C541 Malignant neoplasm of endometrium: Secondary | ICD-10-CM | POA: Diagnosis not present

## 2015-11-21 LAB — CBC WITH DIFFERENTIAL/PLATELET
BASO%: 0.3 % (ref 0.0–2.0)
BASOS ABS: 0 10*3/uL (ref 0.0–0.1)
EOS%: 1 % (ref 0.0–7.0)
Eosinophils Absolute: 0.1 10*3/uL (ref 0.0–0.5)
HCT: 38.6 % (ref 34.8–46.6)
HEMOGLOBIN: 12.7 g/dL (ref 11.6–15.9)
LYMPH%: 23.3 % (ref 14.0–49.7)
MCH: 28.7 pg (ref 25.1–34.0)
MCHC: 32.9 g/dL (ref 31.5–36.0)
MCV: 87.3 fL (ref 79.5–101.0)
MONO#: 0.5 10*3/uL (ref 0.1–0.9)
MONO%: 6.6 % (ref 0.0–14.0)
NEUT#: 5.3 10*3/uL (ref 1.5–6.5)
NEUT%: 68.8 % (ref 38.4–76.8)
Platelets: 232 10*3/uL (ref 145–400)
RBC: 4.42 10*6/uL (ref 3.70–5.45)
RDW: 12.7 % (ref 11.2–14.5)
WBC: 7.7 10*3/uL (ref 3.9–10.3)
lymph#: 1.8 10*3/uL (ref 0.9–3.3)

## 2015-11-21 LAB — COMPREHENSIVE METABOLIC PANEL
ALBUMIN: 3.8 g/dL (ref 3.5–5.0)
ALT: 16 U/L (ref 0–55)
AST: 17 U/L (ref 5–34)
Alkaline Phosphatase: 74 U/L (ref 40–150)
Anion Gap: 8 mEq/L (ref 3–11)
BUN: 18.8 mg/dL (ref 7.0–26.0)
CHLORIDE: 108 meq/L (ref 98–109)
CO2: 26 meq/L (ref 22–29)
Calcium: 9.5 mg/dL (ref 8.4–10.4)
Creatinine: 0.7 mg/dL (ref 0.6–1.1)
EGFR: 90 mL/min/{1.73_m2} — ABNORMAL LOW (ref >90)
GLUCOSE: 83 mg/dL (ref 70–140)
POTASSIUM: 5 meq/L (ref 3.5–5.1)
SODIUM: 142 meq/L (ref 136–145)
Total Bilirubin: 0.46 mg/dL (ref 0.20–1.20)
Total Protein: 7.1 g/dL (ref 6.4–8.3)

## 2015-11-24 ENCOUNTER — Ambulatory Visit (HOSPITAL_COMMUNITY)
Admission: RE | Admit: 2015-11-24 | Discharge: 2015-11-24 | Disposition: A | Discharge status: Home / Self Care | Payer: Federal, State, Local not specified - PPO | Source: Ambulatory Visit | Attending: Gynecologic Oncology | Admitting: Gynecologic Oncology

## 2015-11-24 ENCOUNTER — Telehealth: Payer: Self-pay | Admitting: Gynecologic Oncology

## 2015-11-24 DIAGNOSIS — Z90710 Acquired absence of both cervix and uterus: Secondary | ICD-10-CM | POA: Insufficient documentation

## 2015-11-24 DIAGNOSIS — Z08 Encounter for follow-up examination after completed treatment for malignant neoplasm: Secondary | ICD-10-CM | POA: Diagnosis not present

## 2015-11-24 DIAGNOSIS — Z923 Personal history of irradiation: Secondary | ICD-10-CM | POA: Diagnosis not present

## 2015-11-24 DIAGNOSIS — I700 Atherosclerosis of aorta: Secondary | ICD-10-CM | POA: Insufficient documentation

## 2015-11-24 DIAGNOSIS — C541 Malignant neoplasm of endometrium: Secondary | ICD-10-CM

## 2015-11-24 DIAGNOSIS — Z9221 Personal history of antineoplastic chemotherapy: Secondary | ICD-10-CM | POA: Insufficient documentation

## 2015-11-24 DIAGNOSIS — Z8542 Personal history of malignant neoplasm of other parts of uterus: Secondary | ICD-10-CM | POA: Insufficient documentation

## 2015-11-24 MED ORDER — IOPAMIDOL (ISOVUE-300) INJECTION 61%
100.0000 mL | Freq: Once | INTRAVENOUS | Status: AC | PRN
  Administered 2015-11-24: 100 mL via INTRAVENOUS

## 2015-11-24 NOTE — Telephone Encounter (Signed)
Patient advised of CT scan results.  No concerns voiced.  Advised to call for any needs.

## 2015-11-26 ENCOUNTER — Ambulatory Visit: Payer: Federal, State, Local not specified - PPO | Attending: Gynecologic Oncology | Admitting: Gynecologic Oncology

## 2015-11-26 ENCOUNTER — Encounter: Payer: Self-pay | Admitting: Gynecologic Oncology

## 2015-11-26 VITALS — BP 123/80 | HR 71 | Temp 98.2°F | Resp 18 | Ht 66.0 in | Wt 188.9 lb

## 2015-11-26 DIAGNOSIS — I10 Essential (primary) hypertension: Secondary | ICD-10-CM | POA: Insufficient documentation

## 2015-11-26 DIAGNOSIS — Z8542 Personal history of malignant neoplasm of other parts of uterus: Secondary | ICD-10-CM | POA: Insufficient documentation

## 2015-11-26 DIAGNOSIS — C541 Malignant neoplasm of endometrium: Secondary | ICD-10-CM | POA: Diagnosis not present

## 2015-11-26 DIAGNOSIS — D1803 Hemangioma of intra-abdominal structures: Secondary | ICD-10-CM | POA: Insufficient documentation

## 2015-11-26 DIAGNOSIS — M1990 Unspecified osteoarthritis, unspecified site: Secondary | ICD-10-CM | POA: Insufficient documentation

## 2015-11-26 DIAGNOSIS — Z08 Encounter for follow-up examination after completed treatment for malignant neoplasm: Secondary | ICD-10-CM | POA: Diagnosis present

## 2015-11-26 DIAGNOSIS — Z90710 Acquired absence of both cervix and uterus: Secondary | ICD-10-CM | POA: Insufficient documentation

## 2015-11-26 NOTE — Patient Instructions (Signed)
We will contact the Protocol Services and call you with their recommendations.  Thank you

## 2015-11-26 NOTE — Progress Notes (Signed)
Consult Note: Gyn-Onc  Melissa [name] 60 y.o. female  CC:  Chief Complaint  Patient presents with  . Follow-up    HPI: REASON FOR VISIT: [name] presents today following completion of GOG 258, for treatment of stage IIIC1 endometrial cancer.   HISTORY OF PRESENT ILLNESS: This is a 60 year old, who presented to Dr. Harolyn [name] with complaints of watery vaginal discharge. Endometrial biopsy demonstrated presence of a grade 2 endometrial cancer. On May 08, 2010, she underwent surgical staging and final pathology noted a grade 3 tumor with 73% myometrial invasion. There was lymphovascular space involvement in 1 of 25 lymph nodes, is noted to be positive. The positive node was within the pelvis. The periaortic lymph nodes were negative. After discussion with the patient regarding her treatment option, she elected to participate in GOG 2008. She has randomized treatment chemotherapy and chemotherapy radiation, followed by chemotherapy. She completed the first 3 cycles of chemotherapy and external beam vaginal cuff brachytherapy. She has subsequently received an additional 3 cycles of Taxol and carboplatin therapy.  PS is 0. She had a protocol required CT scan 4/13 and 10/13 that were negative for any evidence of disease. She had a CT scan as well as CXR for protocol purposes in 4 /14 that were both negative for recurrent disease. 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 I last saw her 3/16. At that time her exam was negative. Her Pap smear was also negative at that time.  Protocol required CT scan was performed 11/22/13 that revealed:  FINDINGS:  Lower chest: Visualization of the lower thorax demonstrates no consolidative or nodular pulmonary opacities. Normal heart size.  Hepatobiliary: Liver is normal in size and contour. Stable low-attenuation lesions within the peripheral right  hepatic lobe previously characterized as hemangiomas. The gallbladder is unremarkable. No intrahepatic or extrahepatic biliary ductal dilatation.  Pancreas: Unremarkable  Spleen: Unremarkable  Adrenals/Urinary Tract: Adrenal glands are unremarkable. Kidneys enhance symmetrically with contrast. No hydronephrosis. Urinary bladder is unremarkable.  Stomach/Bowel: No abnormal bowel wall thickening or evidence for bowel obstruction. No free fluid or free intraperitoneal air. Stool is present throughout the colon.  Vascular/Lymphatic: Normal caliber abdominal aorta. No retroperitoneal lymphadenopathy.  Reproductive: Status post hysterectomy.  Other: No evidence for omental peritoneal disease.  Musculoskeletal: No aggressive or acute appearing osseous lesions.  IMPRESSION:  Status post hysterectomy and bilateral oophorectomy. No evidence for recurrent or metastatic disease.  CXR was also negative.  CT 8/16: IMPRESSION: No evidence of recurrent or metastatic carcinoma within the abdomen or pelvis.  Stable small benign hepatic hemangiomas.  CXR 8/16: FINDINGS: The lungs are adequately inflated. There is no focal infiltrate. There is no pleural effusion. The heart is normal in size. The pulmonary vascularity is not engorged. The mediastinum is normal in width. The bony thorax is unremarkable. IMPRESSION: There is no active cardiopulmonary disease.  Interval History:  I last saw her in April 2017. She comes in today for follow-up as well as protocol directed CT scan. Her Pap smear in April 2017 was negative. She is now 5-1/2 years postdiagnosis.  She is CT scan 11/24/2015. She has persistent hemangiomas within the posterior right lobe of the liver measuring 1.4 and 1.0 cm. Both are stable in size back to October 2013 and consistent with benign hemangiomas of characterized on this MRI of the abdomen in May 2012. The pancreas, spleen, adrenals, urinary tract, stomach, bowel,  and musculoskeletal  systems are unremarkable. She does have moderate plaque oh, lumbar spondylosis. There is no evidence of metastatic disease in the abdomen and pelvis. She does have aortic atherosclerosis. She comes in today for follow-up.  She's overall doing quite well. She is no longer has had any episodes of abdominal pain that she had previously. She is getting some exercise. She's had a physical examination with her primary care physician and she again mentioned the prominence of her clavicle on the right. She states should a CT scan of the chest that was unremarkable and was given a referral to orthopedics. She has not scheduled a follow-up yet. She has no pain or discomfort in this area showing notices some sensitivity or some cognitive since of the asymmetry when she lifts things over her head. She recently was in [place] over the summer to celebrate her mother's 90th birthday.  Review of Systems Constitutional: Denies fever. Skin: No rash Cardiovascular: No chest pain, shortness of breath Pulmonary: No cough  Gastro Intestinal: No nausea, vomiting, no acute changes in her bowel habits. Chronic changes related to the radiation therapy she received. Genitourinary: Denies vaginal bleeding and discharge.  Psychology: No changes  Current Meds:  Outpatient Encounter Prescriptions as of 11/26/2015  Medication Sig Dispense Refill  . meloxicam (MOBIC) 15 MG tablet Take 1 tablet (15 mg total) by mouth as needed. 30 tablet 5  . ramipril (ALTACE) 5 MG capsule Take 1 capsule (5 mg total) by mouth daily. 90 capsule 3  . docusate sodium (COLACE) 100 MG capsule Take 100 mg by mouth as needed.      No facility-administered encounter medications on file as of 11/26/2015.     Allergy: No Known Allergies  Social Hx:   Social History   Social History  . Marital status: Married    Spouse name: N/A  . Number of children: N/A  . Years of education: N/A   Occupational History  . Not on file.   Social History  Main Topics  . Smoking status: Never Smoker  . Smokeless tobacco: Never Used  . Alcohol use 0.5 oz/week    1 Standard drinks or equivalent per week     Comment: Occas  . Drug use: No  . Sexual activity: No   Other Topics Concern  . Not on file   Social History Narrative   exercsie at [facility] 4-6 times a week.    Diet: good    Past Surgical Hx:  Past Surgical History:  Procedure Laterality Date  . ABDOMINAL HYSTERECTOMY  05-08-10   LAPAROSCOPIC ROBOTIC ASSISSTED TOTAL HYSTERECTOMY WITH BSO , BILAT. PELVIC AND PERIAORTIC LYMPH NODE EVAL. BY DR. Alycia Rossetti AT [facility]  . CESAREAN SECTION  01/08/80 11/21/82   x2    Past Medical Hx:  Past Medical History:  Diagnosis Date  . Cancer of uterine body ([place]) dx'd 04/14/10   radical hysterectomy, chemotherapy and radiation therapy  . Hypertension   . Osteoarthritis    HX OF SOME  OSTEOARTHRITIS    Oncology Hx:    Cancer of uterus ([place])   05/08/2010 Initial Diagnosis    Cancer of uterus, IIIC1       - 12/01/2010 Chemotherapy    completed chemo therapy with 6 cycles of paclitaxel and carboplatin and vaginal cuff brachytherapy on GOG 258       Family Hx:  Family History  Problem Relation Age of Onset  . Colon cancer Maternal Grandfather   . Pancreatic cancer Father   .  Diabetes Brother     Vitals:  Blood pressure 123/80, pulse 71, temperature 98.2 F (36.8 C), temperature source Oral, resp. rate 18, height 5\' 6"  (1.676 m), weight 188 lb 14.4 oz (85.7 kg), SpO2 100 %.  Physical Exam:  Well-nourished well-developed female in no acute distress.  NECK: Supple, no lymphadenopathy, no thyromegaly. Prominence of the right clavicle.  CHEST: Clear to auscultation.   HEART: Regular rate and rhythm.   LUNGS: Clear to ascultation bilaterally.  LYMPH NODES: No cervical, supraclavicular, inguinal adenopathy.   ABDOMEN: Soft, nontender. No evidence of hernia. No tenderness. No palpable masses or hepatosplenomegaly.  GROIN: No  lymphadenopathy.  GU: Vagina is atrophic. No lesions. She has telengectasias status post radiation therapy. No masses appreciated. Internal examination, no cul-de-sac masses. No nodularity in the rectovaginal septum.    RECTAL: Good anal sphincter tone without any masses.    Assessment/Plan: IMPRESSION: Ms. Lasane is a very pleasant 60 year old who has completed definitive treatment for her stage IIIC1 grade 3 endometrial cancer in 2012.   We will need to speak to the protocol coordinator to see when we need to see her back in clinic. She is greater than 5 years from her diagnosis and completion of treatment would otherwise go to annual visits.    Allisen Pidgeon A., MD 11/26/2015, 9:26 AM

## 2015-12-19 DIAGNOSIS — M542 Cervicalgia: Secondary | ICD-10-CM | POA: Diagnosis not present

## 2016-01-05 DIAGNOSIS — K080 Exfoliation of teeth due to systemic causes: Secondary | ICD-10-CM | POA: Diagnosis not present

## 2016-05-25 ENCOUNTER — Other Ambulatory Visit: Payer: Self-pay | Admitting: Family Medicine

## 2016-05-25 DIAGNOSIS — Z1231 Encounter for screening mammogram for malignant neoplasm of breast: Secondary | ICD-10-CM

## 2016-06-12 ENCOUNTER — Other Ambulatory Visit: Payer: Self-pay | Admitting: Family Medicine

## 2016-06-30 ENCOUNTER — Ambulatory Visit
Admission: RE | Admit: 2016-06-30 | Discharge: 2016-06-30 | Disposition: A | Discharge status: Home / Self Care | Payer: Federal, State, Local not specified - PPO | Source: Ambulatory Visit | Attending: Family Medicine | Admitting: Family Medicine

## 2016-06-30 DIAGNOSIS — Z1231 Encounter for screening mammogram for malignant neoplasm of breast: Secondary | ICD-10-CM | POA: Diagnosis not present

## 2016-07-21 DIAGNOSIS — K080 Exfoliation of teeth due to systemic causes: Secondary | ICD-10-CM | POA: Diagnosis not present

## 2016-10-28 ENCOUNTER — Telehealth: Payer: Self-pay | Admitting: Family Medicine

## 2016-10-28 ENCOUNTER — Other Ambulatory Visit (INDEPENDENT_AMBULATORY_CARE_PROVIDER_SITE_OTHER): Payer: Federal, State, Local not specified - PPO

## 2016-10-28 DIAGNOSIS — M8580 Other specified disorders of bone density and structure, unspecified site: Secondary | ICD-10-CM | POA: Diagnosis not present

## 2016-10-28 DIAGNOSIS — I10 Essential (primary) hypertension: Secondary | ICD-10-CM | POA: Diagnosis not present

## 2016-10-28 LAB — COMPREHENSIVE METABOLIC PANEL
ALBUMIN: 4 g/dL (ref 3.5–5.2)
ALK PHOS: 61 U/L (ref 39–117)
ALT: 14 U/L (ref 0–35)
AST: 15 U/L (ref 0–37)
BUN: 14 mg/dL (ref 6–23)
CALCIUM: 9.3 mg/dL (ref 8.4–10.5)
CO2: 28 mEq/L (ref 19–32)
Chloride: 106 mEq/L (ref 96–112)
Creatinine, Ser: 0.69 mg/dL (ref 0.40–1.20)
GFR: 91.33 mL/min (ref >60.00)
Glucose, Bld: 83 mg/dL (ref 70–99)
Potassium: 4.4 mEq/L (ref 3.5–5.1)
SODIUM: 140 meq/L (ref 135–145)
TOTAL PROTEIN: 6.7 g/dL (ref 6.0–8.3)
Total Bilirubin: 0.5 mg/dL (ref 0.2–1.2)

## 2016-10-28 LAB — LIPID PANEL
CHOLESTEROL: 149 mg/dL (ref 0–200)
HDL: 35.3 mg/dL — ABNORMAL LOW (ref >39.00)
LDL Cholesterol: 91 mg/dL (ref 0–99)
NonHDL: 113.4
TRIGLYCERIDES: 111 mg/dL (ref 0.0–149.0)
Total CHOL/HDL Ratio: 4
VLDL: 22.2 mg/dL (ref 0.0–40.0)

## 2016-10-28 LAB — VITAMIN D 25 HYDROXY (VIT D DEFICIENCY, FRACTURES): VITD: 22.22 ng/mL — AB (ref 30.00–100.00)

## 2016-10-28 NOTE — Telephone Encounter (Signed)
-----   Message from Ellamae Sia sent at 10/20/2016 11:32 AM EDT ----- Regarding: Lab orders for Thursday, 9.6.18 Patient is scheduled for CPX labs, please order future labs, Thanks , Karna Christmas

## 2016-11-02 ENCOUNTER — Ambulatory Visit (INDEPENDENT_AMBULATORY_CARE_PROVIDER_SITE_OTHER): Payer: Federal, State, Local not specified - PPO | Admitting: Family Medicine

## 2016-11-02 ENCOUNTER — Encounter: Payer: Self-pay | Admitting: Family Medicine

## 2016-11-02 VITALS — BP 122/78 | HR 69 | Temp 98.6°F | Ht 65.5 in | Wt 194.5 lb

## 2016-11-02 DIAGNOSIS — Z6831 Body mass index (BMI) 31.0-31.9, adult: Secondary | ICD-10-CM

## 2016-11-02 DIAGNOSIS — E6609 Other obesity due to excess calories: Secondary | ICD-10-CM

## 2016-11-02 DIAGNOSIS — Z23 Encounter for immunization: Secondary | ICD-10-CM | POA: Diagnosis not present

## 2016-11-02 DIAGNOSIS — I10 Essential (primary) hypertension: Secondary | ICD-10-CM

## 2016-11-02 DIAGNOSIS — Z0000 Encounter for general adult medical examination without abnormal findings: Secondary | ICD-10-CM | POA: Diagnosis not present

## 2016-11-02 NOTE — Progress Notes (Signed)
Subjective:    Patient ID: Melissa [name], female    DOB: 20-Oct-1955, 61 y.o.   MRN: [id number]  HPI The patient is here for annual wellness exam and preventative care.    Hypertension:   Good control on ramipril Using medication without problems or lightheadedness:  none Chest pain with exertion: none Edema: none Short of breath: none Average home BPs: Other issues: good control.   vit D: had stopped supplement.  Reviewed labs in detail.  Lab Results  Component Value Date   CHOL 149 10/28/2016   HDL 35.30 (L) 10/28/2016   LDLCALC 91 10/28/2016   TRIG 111.0 10/28/2016   CHOLHDL 4 10/28/2016   Exercise: [employer] 4-5 times a week  Diet: healthy eating overall.  Blood pressure 122/78, pulse 69, temperature 98.6 F (37 C), temperature source Oral, height 5' 5.5" (1.664 m), weight 194 lb 8 oz (88.2 kg). Social History /Family History/Past Medical History reviewed in detail and updated in EMR if needed. Body mass index is 31.87 kg/m.  Review of Systems  Constitutional: Negative for fatigue and fever.  HENT: Negative for congestion.   Eyes: Negative for pain.  Respiratory: Positive for cough. Negative for shortness of breath.        Dry cough off and on.  Cardiovascular: Negative for chest pain, palpitations and leg swelling.  Gastrointestinal: Negative for abdominal pain.  Genitourinary: Negative for dysuria and vaginal bleeding.  Musculoskeletal: Negative for back pain.  Neurological: Negative for syncope, light-headedness and headaches.  Psychiatric/Behavioral: Negative for dysphoric mood.       Objective:   Physical Exam  Constitutional: Vital signs are normal. She appears well-developed and well-nourished. She is cooperative.  Non-toxic appearance. She does not appear ill. No distress.  HENT:  Head: Normocephalic.  Right Ear: Hearing, tympanic membrane, external ear and ear canal normal.  Left Ear: Hearing, tympanic membrane, external ear and ear canal normal.    Nose: Nose normal.  Eyes: Pupils are equal, round, and reactive to light. Conjunctivae, EOM and lids are normal. Lids are everted and swept, no foreign bodies found.  Neck: Trachea normal and normal range of motion. Neck supple. Carotid bruit is not present. No thyroid mass and no thyromegaly present.  Cardiovascular: Normal rate, regular rhythm, S1 normal, S2 normal, normal heart sounds and intact distal pulses.  Exam reveals no gallop.   No murmur heard. Pulmonary/Chest: Effort normal and breath sounds normal. No respiratory distress. She has no wheezes. She has no rhonchi. She has no rales.  Abdominal: Soft. Normal appearance and bowel sounds are normal. She exhibits no distension, no fluid wave, no abdominal bruit and no mass. There is no hepatosplenomegaly. There is no tenderness. There is no rebound, no guarding and no CVA tenderness. No hernia.  Lymphadenopathy:    She has no cervical adenopathy.    She has no axillary adenopathy.  Neurological: She is alert. She has normal strength. No cranial nerve deficit or sensory deficit.  Skin: Skin is warm, dry and intact. No rash noted.  Psychiatric: Her speech is normal and behavior is normal. Judgment normal. Her mood appears not anxious. Cognition and memory are normal. She does not exhibit a depressed mood.          Assessment & Plan:  The patient's preventative maintenance and recommended screening tests for an annual wellness exam were reviewed in full today. Brought up to date unless services declined.  Counselled on the importance of diet, exercise, and its role in overall health and  mortality. The patient's FH and SH was reviewed, including their home life, tobacco status, and drug and alcohol status.   PAP/GYN: hx of endometrial cancer, s/s total abdominal hysterectomy,  5 years out now, pap per Gyn onc Dr. Alycia Rossetti, Pelvic occuring every six months  Vaccines: uptodate with Td, uptodate with shingles and given flu  Colon: Dr.  Fuller Plan. Nml 2013, recommended repeat in 5 year. Due in 2018. Mammogram: 06/2016 3D mammo nml... heterogenously dense breasts.Marland Kitchen  DEXA: mother with osteoporosis, no other risk factors.. 2016 osteopenia but borderline.. Consider repeat in 2 years... Plans 2019 Nonsmoker Hep C screen:  done HIV: done

## 2016-11-02 NOTE — Patient Instructions (Addendum)
Restart OTC vit D3.  Expect call for GI for colonoscopy 5 year scheduling.  Make sure to have bone density done at same time as mammogram next year. Call for order for bone density.

## 2016-11-02 NOTE — Assessment & Plan Note (Signed)
Encouraged exercise, weight loss, healthy eating habits. ? ?

## 2016-11-02 NOTE — Assessment & Plan Note (Addendum)
Well controlled. Continue current medication.  Has dry cough.. Possibly due to ACEi or allergies.. Will try antihistamine first.. If not improving consider change to ARB.

## 2016-11-24 ENCOUNTER — Ambulatory Visit: Payer: Federal, State, Local not specified - PPO | Attending: Gynecologic Oncology | Admitting: Gynecologic Oncology

## 2016-11-24 ENCOUNTER — Encounter: Payer: Self-pay | Admitting: Gynecologic Oncology

## 2016-11-24 VITALS — BP 130/81 | HR 67 | Temp 98.2°F | Resp 18 | Ht 65.5 in | Wt 193.6 lb

## 2016-11-24 DIAGNOSIS — Z808 Family history of malignant neoplasm of other organs or systems: Secondary | ICD-10-CM | POA: Insufficient documentation

## 2016-11-24 DIAGNOSIS — D1809 Hemangioma of other sites: Secondary | ICD-10-CM | POA: Diagnosis not present

## 2016-11-24 DIAGNOSIS — Z08 Encounter for follow-up examination after completed treatment for malignant neoplasm: Secondary | ICD-10-CM | POA: Diagnosis not present

## 2016-11-24 DIAGNOSIS — Z8542 Personal history of malignant neoplasm of other parts of uterus: Secondary | ICD-10-CM

## 2016-11-24 DIAGNOSIS — C541 Malignant neoplasm of endometrium: Secondary | ICD-10-CM

## 2016-11-24 DIAGNOSIS — Z90710 Acquired absence of both cervix and uterus: Secondary | ICD-10-CM | POA: Diagnosis not present

## 2016-11-24 DIAGNOSIS — Z833 Family history of diabetes mellitus: Secondary | ICD-10-CM | POA: Insufficient documentation

## 2016-11-24 DIAGNOSIS — I10 Essential (primary) hypertension: Secondary | ICD-10-CM | POA: Diagnosis not present

## 2016-11-24 DIAGNOSIS — Z9221 Personal history of antineoplastic chemotherapy: Secondary | ICD-10-CM | POA: Diagnosis not present

## 2016-11-24 DIAGNOSIS — Z9889 Other specified postprocedural states: Secondary | ICD-10-CM | POA: Insufficient documentation

## 2016-11-24 DIAGNOSIS — Z79899 Other long term (current) drug therapy: Secondary | ICD-10-CM | POA: Insufficient documentation

## 2016-11-24 NOTE — Patient Instructions (Signed)
Follow up with Dr. Josefa Half. Congratulations and please let us know if we can be of any assistance in the future.

## 2016-11-24 NOTE — Progress Notes (Signed)
Consult Note: Gyn-Onc  Melissa [name] 60 y.o. female  CC:  Chief Complaint  Patient presents with  . Follow-up  . Endometrial cancer ([facility])    HPI: REASON FOR VISIT: [name] presents today following completion of GOG 258, for treatment of stage IIIC1 endometrial cancer.   HISTORY OF PRESENT ILLNESS: This is a 61 year old, who presented to Dr. Harolyn Rutherford with complaints of watery vaginal discharge. Endometrial biopsy demonstrated presence of a grade 2 endometrial cancer. On May 08, 2010, she underwent surgical staging and final pathology noted a grade 3 tumor with 73% myometrial invasion. There was lymphovascular space involvement in 1 of 25 lymph nodes, is noted to be positive. The positive node was within the pelvis. The periaortic lymph nodes were negative. After discussion with the patient regarding her treatment option, she elected to participate in GOG 2008. She has randomized treatment chemotherapy and chemotherapy radiation, followed by chemotherapy. She completed the first 3 cycles of chemotherapy and external beam vaginal cuff brachytherapy. She has subsequently received an additional 3 cycles of Taxol and carboplatin therapy.  PS is 0. She had a protocol required CT scan 4/13 and 10/13 that were negative for any evidence of disease. She had a CT scan as well as CXR for protocol purposes in 4 /14 that were both negative for recurrent disease. 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 I last saw her 10/17. At that time her exam was negative. Her Pap smear was also negative in 3/17.  Protocol required CT scan was performed 11/22/13 that revealed:  FINDINGS:  Lower chest: Visualization of the lower thorax demonstrates no consolidative or nodular pulmonary opacities. Normal heart size.  Hepatobiliary: Liver is normal in size and contour. Stable low-attenuation lesions  within the peripheral right hepatic lobe previously characterized as hemangiomas. The gallbladder is unremarkable. No intrahepatic or extrahepatic biliary ductal dilatation.  Pancreas: Unremarkable  Spleen: Unremarkable  Adrenals/Urinary Tract: Adrenal glands are unremarkable. Kidneys enhance symmetrically with contrast. No hydronephrosis. Urinary bladder is unremarkable.  Stomach/Bowel: No abnormal bowel wall thickening or evidence for bowel obstruction. No free fluid or free intraperitoneal air. Stool is present throughout the colon.  Vascular/Lymphatic: Normal caliber abdominal aorta. No retroperitoneal lymphadenopathy.  Reproductive: Status post hysterectomy.  Other: No evidence for omental peritoneal disease.  Musculoskeletal: No aggressive or acute appearing osseous lesions.  IMPRESSION:  Status post hysterectomy and bilateral oophorectomy. No evidence for recurrent or metastatic disease.  CXR was also negative.  CT 8/16: IMPRESSION: No evidence of recurrent or metastatic carcinoma within the abdomen or pelvis.  Stable small benign hepatic hemangiomas.  CXR 8/16: FINDINGS: The lungs are adequately inflated. There is no focal infiltrate. There is no pleural effusion. The heart is normal in size. The pulmonary vascularity is not engorged. The mediastinum is normal in width. The bony thorax is unremarkable. IMPRESSION: There is no active cardiopulmonary disease.  Interval History:  I last saw her in October 2017. She comes in today for follow-up as well as protocol directed CT scan. Her Pap smear in April 2017 was negative. She is now > 6years postdiagnosis.  She is CT scan 11/24/2015. She has persistent hemangiomas within the posterior right lobe of the liver measuring 1.4 and 1.0 cm. Both are stable in size back to October 2013 and consistent with benign hemangiomas of characterized on this MRI of the abdomen in May 2012. The pancreas, spleen, adrenals,  urinary tract, stomach,  bowel, and musculoskeletal systems are unremarkable. She does have moderate plaque oh, lumbar spondylosis. There is no evidence of metastatic disease in the abdomen and pelvis. She does have aortic atherosclerosis. She comes in today for follow-up.  She's overall doing really quite well. Her review of systems is completely negative as you will see below. She recently had a physical examination with her primary care physician Dr. Josefa Half. She had labs including cholesterol that were unremarkable. At that time she did note a small cough that is worse at night. It was recommended that she take Zantac in the evening this and that has helped. Her mammograms are up-to-date. There are no new medical problems and her family.  Review of Systems Constitutional: Denies fever. Skin: No rash Cardiovascular: No chest pain, shortness of breath Pulmonary: Slight cough as above Gastro Intestinal: No nausea, vomiting, no acute changes in her bowel habits. Chronic changes related to the radiation therapy she received. Genitourinary: Denies vaginal bleeding and discharge.  Psychology: No changes  Current Meds:  Outpatient Encounter Prescriptions as of 11/24/2016  Medication Sig  . docusate sodium (COLACE) 100 MG capsule Take 100 mg by mouth as needed.   . meloxicam (MOBIC) 15 MG tablet Take 1 tablet (15 mg total) by mouth as needed.  . ramipril (ALTACE) 5 MG capsule TAKE 1 CAPSULE (5 MG TOTAL) BY MOUTH DAILY.   No facility-administered encounter medications on file as of 11/24/2016.     Allergy: No Known Allergies  Social Hx:   Social History   Social History  . Marital status: Married    Spouse name: N/A  . Number of children: N/A  . Years of education: N/A   Occupational History  . Not on file.   Social History Main Topics  . Smoking status: Never Smoker  . Smokeless tobacco: Never Used  . Alcohol use 0.5 oz/week    1 Standard drinks or equivalent per week     Comment: Occas  . Drug use: No   . Sexual activity: No   Other Topics Concern  . Not on file   Social History Narrative   exercsie at [employer] [facility] [employer] 4-6 times a week.    Diet: good    Past Surgical Hx:  Past Surgical History:  Procedure Laterality Date  . ABDOMINAL HYSTERECTOMY  05-08-10   LAPAROSCOPIC ROBOTIC ASSISSTED TOTAL HYSTERECTOMY WITH BSO , BILAT. PELVIC AND PERIAORTIC LYMPH NODE EVAL. BY DR. Alycia Rossetti AT [facility]  . CESAREAN SECTION  01/08/80 11/21/82   x2    Past Medical Hx:  Past Medical History:  Diagnosis Date  . Cancer of uterine body ([place]) dx'd 04/14/10   radical hysterectomy, chemotherapy and radiation therapy  . Hypertension   . Osteoarthritis    HX OF SOME  OSTEOARTHRITIS    Oncology Hx:    History of endometrial cancer   05/08/2010 Initial Diagnosis    Cancer of uterus, IIIC1       - 12/01/2010 Chemotherapy    completed chemo therapy with 6 cycles of paclitaxel and carboplatin and vaginal cuff brachytherapy on GOG 258       Family Hx:  Family History  Problem Relation Age of Onset  . Colon cancer Maternal Grandfather   . Pancreatic cancer Father   . Diabetes Brother     Vitals:  Blood pressure 130/81, pulse 67, temperature 98.2 F (36.8 C), temperature source Oral, resp. rate 18, height 5' 5.5" (1.664 m), weight 193 lb 9.6 oz (87.8 kg), SpO2 100 %.  Physical Exam:  Well-nourished well-developed female in no acute distress.  NECK: Supple, no lymphadenopathy, no thyromegaly. Prominence of the right clavicle which appears more prominent and worse as compared to last year.  CHEST: Clear to auscultation.   HEART: Regular rate and rhythm.   LUNGS: Clear to ascultation bilaterally.  LYMPH NODES: No cervical, supraclavicular, inguinal adenopathy.   ABDOMEN: Soft, nontender. No evidence of hernia. No tenderness. No palpable masses or hepatosplenomegaly.  GROIN: No lymphadenopathy.  GU: Vagina is atrophic. No lesions. She has telengectasias status post radiation therapy. No masses  appreciated. Internal examination, no cul-de-sac masses. No nodularity in the rectovaginal septum.    RECTAL: Good anal sphincter tone without any masses.    Assessment/Plan: IMPRESSION: Ms. Otte is a very pleasant 61 year old who has completed definitive treatment for her stage IIIC1 grade 3 endometrial cancer in 2012. She is greater than 6 years from her diagnosis and completion of treatment would otherwise go to annual visits.  She will be released from our clinic and will have her annual GYN examination with Dr. Josefa Half. Signs and symptoms of recurrent endometrial cancer were discussed with the patient.  With regards to the prominence of her right clavicle, she states that this was also noted by her primary care physician. She did see a specialist for this last year and they discussed the significance of the surgery and how risky it was. She has decided to not pursue surgery for this at this time as she is asymptomatic.  It has been our pleasure to participate in her care and we are so very pleased that she has done so well.    Amit Leece A., MD 11/24/2016, 9:56 AM

## 2016-12-07 ENCOUNTER — Encounter: Payer: Self-pay | Admitting: Gastroenterology

## 2016-12-12 ENCOUNTER — Other Ambulatory Visit: Payer: Self-pay | Admitting: Family Medicine

## 2016-12-13 ENCOUNTER — Encounter: Payer: Self-pay | Admitting: Gastroenterology

## 2017-01-19 ENCOUNTER — Other Ambulatory Visit: Payer: Self-pay

## 2017-01-19 ENCOUNTER — Ambulatory Visit (AMBULATORY_SURGERY_CENTER): Payer: Self-pay | Admitting: *Deleted

## 2017-01-19 VITALS — Ht 65.5 in | Wt 196.0 lb

## 2017-01-19 DIAGNOSIS — Z86010 Personal history of colonic polyps: Secondary | ICD-10-CM

## 2017-01-19 MED ORDER — NA SULFATE-K SULFATE-MG SULF 17.5-3.13-1.6 GM/177ML PO SOLN: ORAL | 0 refills | Status: DC

## 2017-01-19 NOTE — Progress Notes (Signed)
Patient denies any allergies to eggs or soy. Patient denies any problems with anesthesia/sedation. Patient denies any oxygen use at home. Patient denies taking any diet/weight loss medications or blood thinners. EMMI education assisgned to patient on colonoscopy, this was explained and instructions given to patient. PATIENT DENIES BEING IN RESEARCH STUDY AT THIS TIME.

## 2017-01-31 ENCOUNTER — Encounter: Payer: Self-pay | Admitting: Gastroenterology

## 2017-02-02 ENCOUNTER — Encounter: Payer: Federal, State, Local not specified - PPO | Admitting: Gastroenterology

## 2017-02-07 DIAGNOSIS — K080 Exfoliation of teeth due to systemic causes: Secondary | ICD-10-CM | POA: Diagnosis not present

## 2017-02-08 ENCOUNTER — Other Ambulatory Visit: Payer: Self-pay

## 2017-02-08 ENCOUNTER — Ambulatory Visit (AMBULATORY_SURGERY_CENTER): Payer: Federal, State, Local not specified - PPO | Admitting: Gastroenterology

## 2017-02-08 ENCOUNTER — Encounter: Payer: Self-pay | Admitting: Gastroenterology

## 2017-02-08 VITALS — BP 116/58 | HR 52 | Temp 96.6°F | Resp 16 | Ht 65.0 in | Wt 196.0 lb

## 2017-02-08 DIAGNOSIS — D126 Benign neoplasm of colon, unspecified: Secondary | ICD-10-CM | POA: Diagnosis not present

## 2017-02-08 DIAGNOSIS — D123 Benign neoplasm of transverse colon: Secondary | ICD-10-CM | POA: Diagnosis not present

## 2017-02-08 DIAGNOSIS — Z86010 Personal history of colonic polyps: Secondary | ICD-10-CM | POA: Diagnosis present

## 2017-02-08 DIAGNOSIS — D124 Benign neoplasm of descending colon: Secondary | ICD-10-CM

## 2017-02-08 DIAGNOSIS — K635 Polyp of colon: Secondary | ICD-10-CM

## 2017-02-08 MED ORDER — SODIUM CHLORIDE 0.9 % IV SOLN: 500.0000 mL | Freq: Once | INTRAVENOUS | Status: DC

## 2017-02-08 NOTE — Patient Instructions (Signed)
YOU HAD AN ENDOSCOPIC PROCEDURE TODAY AT THE [facility]:   Refer to the procedure report that was given to you for any specific questions about what was found during the examination.  If the procedure report does not answer your questions, please call your gastroenterologist to clarify.  If you requested that your care partner not be given the details of your procedure findings, then the procedure report has been included in a sealed envelope for you to review at your convenience later.  YOU SHOULD EXPECT: Some feelings of bloating in the abdomen. Passage of more gas than usual.  Walking can help get rid of the air that was put into your GI tract during the procedure and reduce the bloating. If you had a lower endoscopy (such as a colonoscopy or flexible sigmoidoscopy) you may notice spotting of blood in your stool or on the toilet paper. If you underwent a bowel prep for your procedure, you may not have a normal bowel movement for a few days.  Please Note:  You might notice some irritation and congestion in your nose or some drainage.  This is from the oxygen used during your procedure.  There is no need for concern and it should clear up in a day or so.  SYMPTOMS TO REPORT IMMEDIATELY:   Following lower endoscopy (colonoscopy or flexible sigmoidoscopy):  Excessive amounts of blood in the stool  Significant tenderness or worsening of abdominal pains  Swelling of the abdomen that is new, acute  Fever of 100F or higher   For urgent or emergent issues, a gastroenterologist can be reached at any hour by calling [phone].   DIET:  We do recommend a small meal at first, but then you may proceed to your regular diet.  Drink plenty of fluids but you should avoid alcoholic beverages for 24 hours.  ACTIVITY:  You should plan to take it easy for the rest of today and you should NOT DRIVE or use heavy machinery until tomorrow (because of the sedation medicines used during the test).     FOLLOW UP: Our staff will call the number listed on your records the next business day following your procedure to check on you and address any questions or concerns that you may have regarding the information given to you following your procedure. If we do not reach you, we will leave a message.  However, if you are feeling well and you are not experiencing any problems, there is no need to return our call.  We will assume that you have returned to your regular daily activities without incident.  If any biopsies were taken you will be contacted by phone or by letter within the next 1-3 weeks.  Please call us at [phone] if you have not heard about the biopsies in 3 weeks.    SIGNATURES/CONFIDENTIALITY: You and/or your care partner have signed paperwork which will be entered into your electronic medical record.  These signatures attest to the fact that that the information above on your After Visit Summary has been reviewed and is understood.  Full responsibility of the confidentiality of this discharge information lies with you and/or your care-partner.    Handouts were given to your care partner on polyps and hemorrhoids. You may resume your current medications today. Await biopsy results. Please call if any questions or concerns.   

## 2017-02-08 NOTE — Progress Notes (Signed)
Report given to PACU, vss 

## 2017-02-08 NOTE — Op Note (Signed)
[facility] Patient Name: Melissa [name] Procedure Date: 02/08/2017 1:29 PM MRN: [id number] Endoscopist: Ladene Artist , MD Age: 61 Referring MD:  Date of Birth: 1955-09-30 Gender: Female Account #: [id number] Procedure:                Colonoscopy Indications:              Surveillance: Personal history of adenomatous                            polyps on last colonoscopy 5 years ago Medicines:                Monitored Anesthesia Care Procedure:                Pre-Anesthesia Assessment:                           - Prior to the procedure, a History and Physical                            was performed, and patient medications and                            allergies were reviewed. The patient's tolerance of                            previous anesthesia was also reviewed. The risks                            and benefits of the procedure and the sedation                            options and risks were discussed with the patient.                            All questions were answered, and informed consent                            was obtained. Prior Anticoagulants: The patient has                            taken no previous anticoagulant or antiplatelet                            agents. ASA Grade Assessment: II - A patient with                            mild systemic disease. After reviewing the risks                            and benefits, the patient was deemed in                            satisfactory condition to undergo the procedure.  After obtaining informed consent, the colonoscope                            was passed under direct vision. Throughout the                            procedure, the patient's blood pressure, pulse, and                            oxygen saturations were monitored continuously. The                            Colonoscope was introduced through the anus and                            advanced to the the  cecum, identified by                            appendiceal orifice and ileocecal valve. The                            ileocecal valve, appendiceal orifice, and rectum                            were photographed. The quality of the bowel                            preparation was adequate after extensive lavage and                            suctioning. The colonoscopy was performed without                            difficulty. The patient tolerated the procedure                            well. Scope In: 1:37:58 PM Scope Out: 2:02:47 PM Scope Withdrawal Time: 0 hours 15 minutes 17 seconds  Total Procedure Duration: 0 hours 24 minutes 49 seconds  Findings:                 The perianal exam findings include non-thrombosed                            external hemorrhoids.                           Two sessile polyps were found in the descending                            colon and transverse colon. The polyps were 7 to 8                            mm in size. These polyps were removed with a cold  snare. Resection and retrieval were complete.                           Internal hemorrhoids were found during                            retroflexion. The hemorrhoids were small and Grade                            I (internal hemorrhoids that do not prolapse).                           Localized mild inflammation characterized by                            friability and granularity was found in the rectum.                           The exam was otherwise without abnormality on                            direct and retroflexion views. Complications:            No immediate complications. Estimated blood loss:                            None. Estimated Blood Loss:     Estimated blood loss: none. Impression:               - Non-thrombosed external hemorrhoids found on                            perianal exam.                           - Two 7 to 8 mm polyps in the  descending colon and                            transverse colon, removed with a cold snare.                            Resected and retrieved.                           - Internal hemorrhoids.                           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                           - The examination was otherwise normal on direct  and retroflexion views. Recommendation:           - Repeat colonoscopy in 3 years for surveillance                            with a more extensive bowel prep.                           - Patient has a contact number available for                            emergencies. The signs and symptoms of potential                            delayed complications were discussed with the                            patient. Return to normal activities tomorrow.                            Written discharge instructions were provided to the                            patient.                           - Resume previous diet.                           - Continue present medications.                           - Await pathology results. Ladene Artist, MD 02/08/2017 2:08:23 PM This report has been signed electronically.

## 2017-02-08 NOTE — Progress Notes (Signed)
No problems noted in the recovery room. maw 

## 2017-02-08 NOTE — Progress Notes (Signed)
Pt's states no medical or surgical changes since previsit or office visit. 

## 2017-02-08 NOTE — Progress Notes (Signed)
Called to room to assist during endoscopic procedure.  Patient ID and intended procedure confirmed with present staff. Received instructions for my participation in the procedure from the performing physician.  

## 2017-02-09 ENCOUNTER — Telehealth: Payer: Self-pay | Admitting: *Deleted

## 2017-02-09 NOTE — Telephone Encounter (Signed)
  Follow up Call-  Call back number 02/08/2017  Post procedure Call Back phone  # [phone]  Permission to leave phone message Yes  Some recent data might be hidden     Patient questions:  Do you have a fever, pain , or abdominal swelling? No. Pain Score  0 *  Have you tolerated food without any problems? Yes.    Have you been able to return to your normal activities? Yes.    Do you have any questions about your discharge instructions: Diet   No. Medications  No. Follow up visit  No.  Do you have questions or concerns about your Care? No.  Actions: * If pain score is 4 or above: No action needed, pain <4.  Spoke with husband pt. Was getting out of shower and" she stated she was doing fine."

## 2017-02-18 ENCOUNTER — Encounter: Payer: Self-pay | Admitting: Gastroenterology

## 2017-02-21 ENCOUNTER — Other Ambulatory Visit: Payer: Self-pay | Admitting: Family Medicine

## 2017-02-21 NOTE — Telephone Encounter (Signed)
Last office visit 11/02/2016.  Last refilled 10/31/2015 for #30 with 5 refills.  Ok to refill?

## 2017-05-20 ENCOUNTER — Ambulatory Visit: Payer: Federal, State, Local not specified - PPO | Admitting: Family Medicine

## 2017-05-20 ENCOUNTER — Other Ambulatory Visit: Payer: Self-pay

## 2017-05-20 ENCOUNTER — Encounter: Payer: Self-pay | Admitting: Family Medicine

## 2017-05-20 VITALS — BP 118/72 | HR 66 | Temp 98.7°F | Ht 65.5 in | Wt 194.8 lb

## 2017-05-20 DIAGNOSIS — R1011 Right upper quadrant pain: Secondary | ICD-10-CM | POA: Diagnosis not present

## 2017-05-20 LAB — CBC WITH DIFFERENTIAL/PLATELET
BASOS ABS: 0.1 10*3/uL (ref 0.0–0.1)
BASOS PCT: 0.7 % (ref 0.0–3.0)
EOS ABS: 0.1 10*3/uL (ref 0.0–0.7)
Eosinophils Relative: 0.9 % (ref 0.0–5.0)
HCT: 39.9 % (ref 36.0–46.0)
HEMOGLOBIN: 13.3 g/dL (ref 12.0–15.0)
LYMPHS ABS: 2.4 10*3/uL (ref 0.7–4.0)
LYMPHS PCT: 32.1 % (ref 12.0–46.0)
MCHC: 33.4 g/dL (ref 30.0–36.0)
MCV: 86.5 fl (ref 78.0–100.0)
Monocytes Absolute: 0.6 10*3/uL (ref 0.1–1.0)
Monocytes Relative: 8 % (ref 3.0–12.0)
Neutro Abs: 4.4 10*3/uL (ref 1.4–7.7)
Neutrophils Relative %: 58.3 % (ref 43.0–77.0)
Platelets: 253 10*3/uL (ref 150.0–400.0)
RBC: 4.62 Mil/uL (ref 3.87–5.11)
RDW: 12.9 % (ref 11.5–15.5)
WBC: 7.6 10*3/uL (ref 4.0–10.5)

## 2017-05-20 LAB — COMPREHENSIVE METABOLIC PANEL
ALK PHOS: 59 U/L (ref 39–117)
ALT: 11 U/L (ref 0–35)
AST: 13 U/L (ref 0–37)
Albumin: 4.1 g/dL (ref 3.5–5.2)
BILIRUBIN TOTAL: 0.4 mg/dL (ref 0.2–1.2)
BUN: 18 mg/dL (ref 6–23)
CO2: 31 meq/L (ref 19–32)
Calcium: 9.7 mg/dL (ref 8.4–10.5)
Chloride: 101 mEq/L (ref 96–112)
Creatinine, Ser: 0.64 mg/dL (ref 0.40–1.20)
GFR: 99.43 mL/min (ref >60.00)
GLUCOSE: 107 mg/dL — AB (ref 70–99)
Potassium: 4.6 mEq/L (ref 3.5–5.1)
SODIUM: 137 meq/L (ref 135–145)
TOTAL PROTEIN: 7.3 g/dL (ref 6.0–8.3)

## 2017-05-20 LAB — LIPASE: Lipase: 26 U/L (ref 11.0–59.0)

## 2017-05-20 NOTE — Patient Instructions (Addendum)
Avoid  meloxicam for now, as well as spicy, citris, tomato, greasy foods.   Start prilosec  2 tabs of 20 mg daily  for 4-6 weeks then stop  Please stop at the lab to have labs drawn. Please stop at the front desk to set up referral.  If fever or symptoms not resolving after 3 hours.. Go to ER>

## 2017-05-20 NOTE — Progress Notes (Signed)
   Subjective:    Patient ID: Melissa [name], female    DOB: 1955/10/08, 62 y.o.   MRN: [id number]  HPI  62 year old female presents with RUQ after eating x 2 months. Has episodes  Off and on. Last episode was early this week.. Monday. Emesis, no fever. No dysuria, no blood in urine or blood in stool.  Occ heartburn.  Triggered by greasy fried food and acid.  Has gallbladder.  Has tried peptobismol, tums...  No help.  Not using meloxicam except rarely.   Review of Systems  Constitutional: Negative for fatigue and fever.  HENT: Negative for congestion.   Eyes: Negative for pain.  Respiratory: Negative for cough and shortness of breath.   Cardiovascular: Negative for chest pain, palpitations and leg swelling.  Gastrointestinal: Positive for abdominal pain, nausea and vomiting.  Genitourinary: Negative for dysuria and vaginal bleeding.  Musculoskeletal: Negative for back pain.  Neurological: Negative for syncope, light-headedness and headaches.  Psychiatric/Behavioral: Negative for dysphoric mood.       Objective:   Physical Exam  Constitutional: Vital signs are normal. She appears well-developed and well-nourished. She is cooperative.  Non-toxic appearance. She does not appear ill. No distress.  HENT:  Head: Normocephalic.  Right Ear: Hearing, tympanic membrane, external ear and ear canal normal. Tympanic membrane is not erythematous, not retracted and not bulging.  Left Ear: Hearing, tympanic membrane, external ear and ear canal normal. Tympanic membrane is not erythematous, not retracted and not bulging.  Nose: No mucosal edema or rhinorrhea. Right sinus exhibits no maxillary sinus tenderness and no frontal sinus tenderness. Left sinus exhibits no maxillary sinus tenderness and no frontal sinus tenderness.  Mouth/Throat: Uvula is midline, oropharynx is clear and moist and mucous membranes are normal.  Eyes: Pupils are equal, round, and reactive to light. Conjunctivae,  EOM and lids are normal. Lids are everted and swept, no foreign bodies found.  Neck: Trachea normal and normal range of motion. Neck supple. Carotid bruit is not present. No thyroid mass and no thyromegaly present.  Cardiovascular: Normal rate, regular rhythm, S1 normal, S2 normal, normal heart sounds, intact distal pulses and normal pulses. Exam reveals no gallop and no friction rub.  No murmur heard. Pulmonary/Chest: Effort normal and breath sounds normal. No tachypnea. No respiratory distress. She has no decreased breath sounds. She has no wheezes. She has no rhonchi. She has no rales.  Abdominal: Soft. Normal appearance and bowel sounds are normal. There is tenderness in the right upper quadrant.  Neurological: She is alert.  Skin: Skin is warm, dry and intact. No rash noted.  Psychiatric: Her speech is normal and behavior is normal. Judgment and thought content normal. Her mood appears not anxious. Cognition and memory are normal. She does not exhibit a depressed mood.          Assessment & Plan:

## 2017-05-20 NOTE — Assessment & Plan Note (Signed)
Avoid trigger.  Eval with labs and RUQ [place] to rule out gallbladder disease. Start PPI.

## 2017-05-26 ENCOUNTER — Ambulatory Visit
Admission: RE | Admit: 2017-05-26 | Discharge: 2017-05-26 | Disposition: A | Discharge status: Home / Self Care | Payer: Federal, State, Local not specified - PPO | Source: Ambulatory Visit | Attending: Family Medicine | Admitting: Family Medicine

## 2017-05-26 DIAGNOSIS — R935 Abnormal findings on diagnostic imaging of other abdominal regions, including retroperitoneum: Secondary | ICD-10-CM | POA: Diagnosis not present

## 2017-05-26 DIAGNOSIS — R1011 Right upper quadrant pain: Secondary | ICD-10-CM | POA: Insufficient documentation

## 2017-06-13 ENCOUNTER — Other Ambulatory Visit: Payer: Self-pay | Admitting: Family Medicine

## 2017-06-13 DIAGNOSIS — Z1231 Encounter for screening mammogram for malignant neoplasm of breast: Secondary | ICD-10-CM

## 2017-06-15 ENCOUNTER — Other Ambulatory Visit: Payer: Self-pay | Admitting: Family Medicine

## 2017-07-01 ENCOUNTER — Ambulatory Visit
Admission: RE | Admit: 2017-07-01 | Discharge: 2017-07-01 | Disposition: A | Discharge status: Home / Self Care | Payer: Federal, State, Local not specified - PPO | Source: Ambulatory Visit | Attending: Family Medicine | Admitting: Family Medicine

## 2017-07-01 DIAGNOSIS — Z1231 Encounter for screening mammogram for malignant neoplasm of breast: Secondary | ICD-10-CM

## 2017-08-10 DIAGNOSIS — K080 Exfoliation of teeth due to systemic causes: Secondary | ICD-10-CM | POA: Diagnosis not present

## 2017-10-27 ENCOUNTER — Ambulatory Visit: Payer: Federal, State, Local not specified - PPO | Admitting: Family Medicine

## 2017-10-27 ENCOUNTER — Encounter: Payer: Self-pay | Admitting: Family Medicine

## 2017-10-27 VITALS — BP 122/74 | HR 64 | Temp 97.7°F | Ht 65.5 in | Wt 189.8 lb

## 2017-10-27 DIAGNOSIS — R112 Nausea with vomiting, unspecified: Secondary | ICD-10-CM | POA: Diagnosis not present

## 2017-10-27 DIAGNOSIS — Z23 Encounter for immunization: Secondary | ICD-10-CM | POA: Diagnosis not present

## 2017-10-27 DIAGNOSIS — R1011 Right upper quadrant pain: Secondary | ICD-10-CM

## 2017-10-27 DIAGNOSIS — R109 Unspecified abdominal pain: Secondary | ICD-10-CM

## 2017-10-27 LAB — POC URINALSYSI DIPSTICK (AUTOMATED)
Bilirubin, UA: NEGATIVE
Glucose, UA: NEGATIVE
Ketones, UA: NEGATIVE
NITRITE UA: NEGATIVE
PH UA: 6 (ref 5.0–8.0)
PROTEIN UA: NEGATIVE
RBC UA: NEGATIVE
Spec Grav, UA: 1.015 (ref 1.010–1.025)
UROBILINOGEN UA: 0.2 U/dL

## 2017-10-27 NOTE — Assessment & Plan Note (Signed)
Very likely gallbladder dysfunction. lwith HIDa  No gallstones on [place], neg lipase, nml wbc Less likely gastritis and PUD.. If HIDA neg consider Hpylori stool and then PPI, GI referral.

## 2017-10-27 NOTE — Patient Instructions (Signed)
Please stop at the front desk to set up referral.  

## 2017-10-27 NOTE — Progress Notes (Signed)
   Subjective:    Patient ID: Melissa [name], female    DOB: 06-May-1955, 62 y.o.   MRN: [id number]  HPI    62 year old female pt presents with nausea and emesis with with eating greasy foods 2 episodes in last few months. More recently noted right sided upper quadrant pain in last 2 weeks.Marland Kitchen Describes as discomfort constantly.  Not sure why worse some day and not others.  No heartburn. No dysuria, no fever.  She has occ softer stools, not really diarrhea. No blood in stool. No change of pain with BM.  Had similar pain RUQ in 04/2017  CMET, cbc, lipase were neg 05/2017 RUQ US showed no gallstones  S/p hysterectomy, total Colonoscopy precancerous polyps 01/2017  Blood pressure 122/74, pulse 64, temperature 97.7 F (36.5 C), temperature source Oral, height 5' 5.5" (1.664 m), weight 189 lb 12 oz (86.1 kg). Social History /Family History/Past Medical History reviewed in detail and updated in EMR if needed.  Review of Systems  Constitutional: Negative for fatigue and fever.  HENT: Negative for ear pain.   Eyes: Negative for pain.  Respiratory: Negative for chest tightness and shortness of breath.   Cardiovascular: Negative for chest pain, palpitations and leg swelling.  Gastrointestinal: Positive for abdominal distention and abdominal pain. Negative for constipation and diarrhea.  Genitourinary: Negative for dysuria.       Objective:   Physical Exam  Constitutional: Vital signs are normal. She appears well-developed and well-nourished. She is cooperative.  Non-toxic appearance. She does not appear ill. No distress.  HENT:  Head: Normocephalic.  Right Ear: Hearing, tympanic membrane, external ear and ear canal normal. Tympanic membrane is not erythematous, not retracted and not bulging.  Left Ear: Hearing, tympanic membrane, external ear and ear canal normal. Tympanic membrane is not erythematous, not retracted and not bulging.  Nose: No mucosal edema or rhinorrhea. Right sinus  exhibits no maxillary sinus tenderness and no frontal sinus tenderness. Left sinus exhibits no maxillary sinus tenderness and no frontal sinus tenderness.  Mouth/Throat: Uvula is midline, oropharynx is clear and moist and mucous membranes are normal.  Eyes: Pupils are equal, round, and reactive to light. Conjunctivae, EOM and lids are normal. Lids are everted and swept, no foreign bodies found.  Neck: Trachea normal and normal range of motion. Neck supple. Carotid bruit is not present. No thyroid mass and no thyromegaly present.  Cardiovascular: Normal rate, regular rhythm, S1 normal, S2 normal, normal heart sounds, intact distal pulses and normal pulses. Exam reveals no gallop and no friction rub.  No murmur heard. Pulmonary/Chest: Effort normal and breath sounds normal. No tachypnea. No respiratory distress. She has no decreased breath sounds. She has no wheezes. She has no rhonchi. She has no rales.  Abdominal: Soft. Normal appearance and bowel sounds are normal. There is tenderness in the right upper quadrant. There is CVA tenderness.   Right CVA ttp  Neurological: She is alert.  Skin: Skin is warm, dry and intact. No rash noted.  Psychiatric: Her speech is normal and behavior is normal. Judgment and thought content normal. Her mood appears not anxious. Cognition and memory are normal. She does not exhibit a depressed mood.          Assessment & Plan:

## 2017-11-03 ENCOUNTER — Ambulatory Visit
Admission: RE | Admit: 2017-11-03 | Discharge: 2017-11-03 | Disposition: A | Discharge status: Home / Self Care | Payer: Federal, State, Local not specified - PPO | Source: Ambulatory Visit | Attending: Family Medicine | Admitting: Family Medicine

## 2017-11-03 DIAGNOSIS — R112 Nausea with vomiting, unspecified: Secondary | ICD-10-CM | POA: Diagnosis not present

## 2017-11-03 DIAGNOSIS — R1011 Right upper quadrant pain: Secondary | ICD-10-CM

## 2017-11-03 DIAGNOSIS — R1010 Upper abdominal pain, unspecified: Secondary | ICD-10-CM | POA: Diagnosis not present

## 2017-11-03 MED ORDER — TECHNETIUM TC 99M MEBROFENIN IV KIT
5.5610 | PACK | Freq: Once | INTRAVENOUS | Status: AC | PRN
  Administered 2017-11-03: 5.561 via INTRAVENOUS

## 2017-11-04 ENCOUNTER — Other Ambulatory Visit: Payer: Self-pay | Admitting: Family Medicine

## 2017-11-04 DIAGNOSIS — K828 Other specified diseases of gallbladder: Secondary | ICD-10-CM

## 2017-11-10 ENCOUNTER — Ambulatory Visit: Payer: Federal, State, Local not specified - PPO | Admitting: Surgery

## 2017-11-10 ENCOUNTER — Encounter: Payer: Self-pay | Admitting: Surgery

## 2017-11-10 VITALS — BP 130/83 | HR 67 | Temp 98.7°F | Ht 65.5 in | Wt 189.0 lb

## 2017-11-10 DIAGNOSIS — K828 Other specified diseases of gallbladder: Secondary | ICD-10-CM | POA: Diagnosis not present

## 2017-11-10 NOTE — H&P (View-Only) (Signed)
Surgical Clinic History and Physical  Referring provider:  Jinny Sanders, MD [place], [place] [address]  HISTORY OF PRESENT ILLNESS (HPI):  62 y.o. female presents for evaluation of abdominal pain. Patient reports she's experienced RUQ abdominal pain specifically after eating fatty and greasy foods x 5 - 10 years, but it's been more severe and more frequent over the past few months, for which abdominal ultrasound and more recently HIDA imaging were ordered and performed. Her pain is less, though has not completely resolved, with avoidance of fatty foods. She also describes a chronic history of alternating constipation x typically 3 days, followed by 3 non-loose BM's over the course of one day. Patient otherwise reports +nausea and occasional non-bloody emesis when she experiences pain, denies any abdominal distention, fever/chills, diarrhea, CP, or SOB.  PAST MEDICAL HISTORY (PMH):  Past Medical History:  Diagnosis Date  . Cancer of uterine body ([place]) dx'd 04/14/10   radical hysterectomy, chemotherapy and radiation therapy  . Hypertension   . Osteoarthritis    HX OF SOME  OSTEOARTHRITIS     PAST SURGICAL HISTORY ([place]):  Past Surgical History:  Procedure Laterality Date  . ABDOMINAL HYSTERECTOMY  05-08-10   LAPAROSCOPIC ROBOTIC ASSISSTED TOTAL HYSTERECTOMY WITH BSO , BILAT. PELVIC AND PERIAORTIC LYMPH NODE EVAL. BY DR. Alycia Rossetti AT [facility]  . CESAREAN SECTION  01/08/80 11/21/82   x2  . COLONOSCOPY  [id number]     MEDICATIONS:  Prior to Admission medications   Medication Sig Start Date End Date Taking? Authorizing Provider  calcium carbonate (TUMS - DOSED IN MG ELEMENTAL CALCIUM) 500 MG chewable tablet Chew 1 tablet by mouth daily.   Yes [provider]  Cholecalciferol (VITAMIN D PO) Take 1 tablet by mouth daily.   Yes [provider]  meloxicam (MOBIC) 15 MG tablet TAKE 1 TABLET (15 MG TOTAL) BY MOUTH AS NEEDED. 02/21/17  Yes Bedsole, Amy E, MD   ramipril (ALTACE) 5 MG capsule TAKE 1 CAPSULE (5 MG TOTAL) BY MOUTH DAILY. 06/15/17  Yes Bedsole, Amy E, MD     ALLERGIES:  No Known Allergies   SOCIAL HISTORY:  Social History   Socioeconomic History  . Marital status: Married    Spouse name: Not on file  . Number of children: Not on file  . Years of education: Not on file  . Highest education level: Not on file  Occupational History  . Not on file  Social Needs  . Financial resource strain: Not on file  . Food insecurity:    Worry: Not on file    Inability: Not on file  . Transportation needs:    Medical: Not on file    Non-medical: Not on file  Tobacco Use  . Smoking status: Never Smoker  . Smokeless tobacco: Never Used  Substance and Sexual Activity  . Alcohol use: Yes    Comment: Occas-holidays  . Drug use: No  . Sexual activity: Never  Lifestyle  . Physical activity:    Days per week: Not on file    Minutes per session: Not on file  . Stress: Not on file  Relationships  . Social connections:    Talks on phone: Not on file    Gets together: Not on file    Attends religious service: Not on file    Active member of club or organization: Not on file    Attends meetings of clubs or organizations: Not on file    Relationship status: Not on file  .  Intimate partner violence:    Fear of current or ex partner: Not on file    Emotionally abused: Not on file    Physically abused: Not on file    Forced sexual activity: Not on file  Other Topics Concern  . Not on file  Social History Narrative   exercsie at [facility] 4-6 times a week.    Diet: good    The patient currently resides (home / rehab facility / nursing home): Home The patient normally is (ambulatory / bedbound): Ambulatory  FAMILY HISTORY:  Family History  Problem Relation Age of Onset  . Colon cancer Maternal Grandfather        70's  . Pancreatic cancer Father   . Diabetes Brother   . Diabetes Brother     Otherwise  negative/non-contributory.  REVIEW OF SYSTEMS:  Constitutional: denies any other weight loss, fever, chills, or sweats  Eyes: denies any other vision changes, history of eye injury  ENT: denies sore throat, hearing problems  Respiratory: denies shortness of breath, wheezing  Cardiovascular: denies chest pain, palpitations  Gastrointestinal: abdominal pain, N/V, and bowel function as per HPI Musculoskeletal: denies any other joint pains or cramps  Skin: Denies any other rashes or skin discolorations Neurological: denies any other headache, dizziness, weakness  Psychiatric: Denies any other depression, anxiety   All other review of systems were otherwise negative   VITAL SIGNS:  BP 130/83   Pulse 67   Temp 98.7 F (37.1 C) (Skin)   Ht 5' 5.5" (1.664 m)   Wt 189 lb (85.7 kg)   BMI 30.97 kg/m   PHYSICAL EXAM:  Constitutional:  -- Normal body habitus  -- Awake, alert, and oriented x3  Eyes:  -- Pupils equally round and reactive to light  -- No scleral icterus  Ear, nose, throat:  -- No jugular venous distension -- No nasal drainage, bleeding Pulmonary:  -- No crackles  -- Equal breath sounds bilaterally -- Breathing non-labored at rest Cardiovascular:  -- S1, S2 present  -- No pericardial rubs  Gastrointestinal:  -- Abdomen soft and non-distended with mild-/moderate- RUQ > epigastric abdominal tenderness to palpation, no guarding/rebound  -- No abdominal masses appreciated, pulsatile or otherwise  Musculoskeletal and Integumentary:  -- Wounds or skin discoloration: None appreciated -- Extremities: B/L UE and LE FROM, hands and feet warm, no edema  Neurologic:  -- Motor function: Intact and symmetric -- Sensation: Intact and symmetric  Labs:  CBC Latest Ref Rng & Units 05/20/2017 11/21/2015 10/21/2014  WBC 4.0 - 10.5 K/uL 7.6 7.7 5.5  Hemoglobin 12.0 - 15.0 g/dL 13.3 12.7 13.0  Hematocrit 36.0 - 46.0 % 39.9 38.6 39.6  Platelets 150.0 - 400.0 K/uL 253.0 232 197   CMP  Latest Ref Rng & Units 05/20/2017 10/28/2016 11/21/2015  Glucose 70 - 99 mg/dL 107(H) 83 83  BUN 6 - 23 mg/dL 18 14 18.8  Creatinine 0.40 - 1.20 mg/dL 0.64 0.69 0.7  Sodium 135 - 145 mEq/L 137 140 142  Potassium 3.5 - 5.1 mEq/L 4.6 4.4 5.0  Chloride 96 - 112 mEq/L 101 106 -  CO2 19 - 32 mEq/L 31 28 26   Calcium 8.4 - 10.5 mg/dL 9.7 9.3 9.5  Total Protein 6.0 - 8.3 g/dL 7.3 6.7 7.1  Total Bilirubin 0.2 - 1.2 mg/dL 0.4 0.5 0.46  Alkaline Phos 39 - 117 U/L 59 61 74  AST 0 - 37 U/L 13 15 17   ALT 0 - 35 U/L 11 14 16  Imaging studies:  Limited RUQ Abdominal Ultrasound (05/26/2017) 1.  No gallstones or biliary distention.  2. 8 mm hyperechoic focus in the left hepatic lobe most likely a tiny hemangioma. Previously identified hemangiomas noted on prior CT and MRI not identified by today's ultrasound.  HIDA Nuclear Hepatobiliary Imaging with EF (11/03/2017) Abnormally low ejection fraction of radiotracer from the gallbladder, a finding felt to be indicative of biliary dyskinesia. Cystic and common bile ducts are patent as is evidenced by visualization of gallbladder and small bowel.  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   - reviewed with patient results of imaging studies  - all risks, benefits, and alternatives to cholecystectomy were discussed with the patient, all of her questions were answered to her expressed satisfaction, patient expresses she wishes to proceed, and informed consent was obtained.  - will plan for laparoscopic cholecystectomy next Wednesday, 9/25 per patient request and OR availability  - anticipate return to clinic 2 weeks following above elective procedure  - instructed to call office if any questions or concerns  All of the above recommendations were discussed with the patient, and all of patient's questions were answered to her expressed  satisfaction.  Thank you for the opportunity to participate in this patient's care.  -- Marilynne Drivers Rosana Hoes, MD, [place]: [facility] - Partnering for exceptional care. Office: [phone]

## 2017-11-10 NOTE — Progress Notes (Signed)
Surgical Clinic History and Physical  Referring provider:  Jinny Sanders, MD [place], [place] [address]  HISTORY OF PRESENT ILLNESS (HPI):  62 y.o. female presents for evaluation of abdominal pain. Patient reports she's experienced RUQ abdominal pain specifically after eating fatty and greasy foods x 5 - 10 years, but it's been more severe and more frequent over the past few months, for which abdominal ultrasound and more recently HIDA imaging were ordered and performed. Her pain is less, though has not completely resolved, with avoidance of fatty foods. She also describes a chronic history of alternating constipation x typically 3 days, followed by 3 non-loose BM's over the course of one day. Patient otherwise reports +nausea and occasional non-bloody emesis when she experiences pain, denies any abdominal distention, fever/chills, diarrhea, CP, or SOB.  PAST MEDICAL HISTORY (PMH):  Past Medical History:  Diagnosis Date  . Cancer of uterine body ([place]) dx'd 04/14/10   radical hysterectomy, chemotherapy and radiation therapy  . Hypertension   . Osteoarthritis    HX OF SOME  OSTEOARTHRITIS     PAST SURGICAL HISTORY ([place]):  Past Surgical History:  Procedure Laterality Date  . ABDOMINAL HYSTERECTOMY  05-08-10   LAPAROSCOPIC ROBOTIC ASSISSTED TOTAL HYSTERECTOMY WITH BSO , BILAT. PELVIC AND PERIAORTIC LYMPH NODE EVAL. BY DR. Alycia Rossetti AT [facility]  . CESAREAN SECTION  01/08/80 11/21/82   x2  . COLONOSCOPY  [id number]     MEDICATIONS:  Prior to Admission medications   Medication Sig Start Date End Date Taking? Authorizing Provider  calcium carbonate (TUMS - DOSED IN MG ELEMENTAL CALCIUM) 500 MG chewable tablet Chew 1 tablet by mouth daily.   Yes [provider]  Cholecalciferol (VITAMIN D PO) Take 1 tablet by mouth daily.   Yes [provider]  meloxicam (MOBIC) 15 MG tablet TAKE 1 TABLET (15 MG TOTAL) BY MOUTH AS NEEDED. 02/21/17  Yes Bedsole, Amy E, MD   ramipril (ALTACE) 5 MG capsule TAKE 1 CAPSULE (5 MG TOTAL) BY MOUTH DAILY. 06/15/17  Yes Bedsole, Amy E, MD     ALLERGIES:  No Known Allergies   SOCIAL HISTORY:  Social History   Socioeconomic History  . Marital status: Married    Spouse name: Not on file  . Number of children: Not on file  . Years of education: Not on file  . Highest education level: Not on file  Occupational History  . Not on file  Social Needs  . Financial resource strain: Not on file  . Food insecurity:    Worry: Not on file    Inability: Not on file  . Transportation needs:    Medical: Not on file    Non-medical: Not on file  Tobacco Use  . Smoking status: Never Smoker  . Smokeless tobacco: Never Used  Substance and Sexual Activity  . Alcohol use: Yes    Comment: Occas-holidays  . Drug use: No  . Sexual activity: Never  Lifestyle  . Physical activity:    Days per week: Not on file    Minutes per session: Not on file  . Stress: Not on file  Relationships  . Social connections:    Talks on phone: Not on file    Gets together: Not on file    Attends religious service: Not on file    Active member of club or organization: Not on file    Attends meetings of clubs or organizations: Not on file    Relationship status: Not on file  .  Intimate partner violence:    Fear of current or ex partner: Not on file    Emotionally abused: Not on file    Physically abused: Not on file    Forced sexual activity: Not on file  Other Topics Concern  . Not on file  Social History Narrative   exercsie at [facility] 4-6 times a week.    Diet: good    The patient currently resides (home / rehab facility / nursing home): Home The patient normally is (ambulatory / bedbound): Ambulatory  FAMILY HISTORY:  Family History  Problem Relation Age of Onset  . Colon cancer Maternal Grandfather        70's  . Pancreatic cancer Father   . Diabetes Brother   . Diabetes Brother     Otherwise  negative/non-contributory.  REVIEW OF SYSTEMS:  Constitutional: denies any other weight loss, fever, chills, or sweats  Eyes: denies any other vision changes, history of eye injury  ENT: denies sore throat, hearing problems  Respiratory: denies shortness of breath, wheezing  Cardiovascular: denies chest pain, palpitations  Gastrointestinal: abdominal pain, N/V, and bowel function as per HPI Musculoskeletal: denies any other joint pains or cramps  Skin: Denies any other rashes or skin discolorations Neurological: denies any other headache, dizziness, weakness  Psychiatric: Denies any other depression, anxiety   All other review of systems were otherwise negative   VITAL SIGNS:  BP 130/83   Pulse 67   Temp 98.7 F (37.1 C) (Skin)   Ht 5' 5.5" (1.664 m)   Wt 189 lb (85.7 kg)   BMI 30.97 kg/m   PHYSICAL EXAM:  Constitutional:  -- Normal body habitus  -- Awake, alert, and oriented x3  Eyes:  -- Pupils equally round and reactive to light  -- No scleral icterus  Ear, nose, throat:  -- No jugular venous distension -- No nasal drainage, bleeding Pulmonary:  -- No crackles  -- Equal breath sounds bilaterally -- Breathing non-labored at rest Cardiovascular:  -- S1, S2 present  -- No pericardial rubs  Gastrointestinal:  -- Abdomen soft and non-distended with mild-/moderate- RUQ > epigastric abdominal tenderness to palpation, no guarding/rebound  -- No abdominal masses appreciated, pulsatile or otherwise  Musculoskeletal and Integumentary:  -- Wounds or skin discoloration: None appreciated -- Extremities: B/L UE and LE FROM, hands and feet warm, no edema  Neurologic:  -- Motor function: Intact and symmetric -- Sensation: Intact and symmetric  Labs:  CBC Latest Ref Rng & Units 05/20/2017 11/21/2015 10/21/2014  WBC 4.0 - 10.5 K/uL 7.6 7.7 5.5  Hemoglobin 12.0 - 15.0 g/dL 13.3 12.7 13.0  Hematocrit 36.0 - 46.0 % 39.9 38.6 39.6  Platelets 150.0 - 400.0 K/uL 253.0 232 197   CMP  Latest Ref Rng & Units 05/20/2017 10/28/2016 11/21/2015  Glucose 70 - 99 mg/dL 107(H) 83 83  BUN 6 - 23 mg/dL 18 14 18.8  Creatinine 0.40 - 1.20 mg/dL 0.64 0.69 0.7  Sodium 135 - 145 mEq/L 137 140 142  Potassium 3.5 - 5.1 mEq/L 4.6 4.4 5.0  Chloride 96 - 112 mEq/L 101 106 -  CO2 19 - 32 mEq/L 31 28 26   Calcium 8.4 - 10.5 mg/dL 9.7 9.3 9.5  Total Protein 6.0 - 8.3 g/dL 7.3 6.7 7.1  Total Bilirubin 0.2 - 1.2 mg/dL 0.4 0.5 0.46  Alkaline Phos 39 - 117 U/L 59 61 74  AST 0 - 37 U/L 13 15 17   ALT 0 - 35 U/L 11 14 16  Imaging studies:  Limited RUQ Abdominal Ultrasound (05/26/2017) 1.  No gallstones or biliary distention.  2. 8 mm hyperechoic focus in the left hepatic lobe most likely a tiny hemangioma. Previously identified hemangiomas noted on prior CT and MRI not identified by today's ultrasound.  HIDA Nuclear Hepatobiliary Imaging with EF (11/03/2017) Abnormally low ejection fraction of radiotracer from the gallbladder, a finding felt to be indicative of biliary dyskinesia. Cystic and common bile ducts are patent as is evidenced by visualization of gallbladder and small bowel.  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   - reviewed with patient results of imaging studies  - all risks, benefits, and alternatives to cholecystectomy were discussed with the patient, all of her questions were answered to her expressed satisfaction, patient expresses she wishes to proceed, and informed consent was obtained.  - will plan for laparoscopic cholecystectomy next Wednesday, 9/25 per patient request and OR availability  - anticipate return to clinic 2 weeks following above elective procedure  - instructed to call office if any questions or concerns  All of the above recommendations were discussed with the patient, and all of patient's questions were answered to her expressed  satisfaction.  Thank you for the opportunity to participate in this patient's care.  -- Marilynne Drivers Rosana Hoes, MD, [place]: [facility] - Partnering for exceptional care. Office: [phone]

## 2017-11-10 NOTE — Patient Instructions (Addendum)
You have requested to have your gallbladder removed. This will be done on 11-16-17 at [facility] with Dr. Rosana Hoes.  You will most likely be out of work 1-2 weeks for this surgery. You will return after your post-op appointment with a lifting restriction for approximately 4 more weeks.  You will be able to eat anything you would like to following surgery. But, start by eating a bland diet and advance this as tolerated. The Gallbladder diet is below, please go as closely by this diet as possible prior to surgery to avoid any further attacks.  Please see the (blue)pre-care form that you have been given today. If you have any questions, please call our office.  Laparoscopic Cholecystectomy Laparoscopic cholecystectomy is surgery to remove the gallbladder. The gallbladder is located in the upper right part of the abdomen, behind the liver. It is a storage sac for bile, which is produced in the liver. Bile aids in the digestion and absorption of fats. Cholecystectomy is often done for inflammation of the gallbladder (cholecystitis). This condition is usually caused by a buildup of gallstones (cholelithiasis) in the gallbladder. Gallstones can block the flow of bile, and that can result in inflammation and pain. In severe cases, emergency surgery may be required. If emergency surgery is not required, you will have time to prepare for the procedure. Laparoscopic surgery is an alternative to open surgery. Laparoscopic surgery has a shorter recovery time. Your common bile duct may also need to be examined during the procedure. If stones are found in the common bile duct, they may be removed. LET [facility] CARE PROVIDER KNOW ABOUT:  Any allergies you have.  All medicines you are taking, including vitamins, herbs, eye drops, creams, and over-the-counter medicines.  Previous problems you or members of your family have had with the use of anesthetics.  Any blood disorders you have.  Previous surgeries  you have had.    Any medical conditions you have. RISKS AND COMPLICATIONS Generally, this is a safe procedure. However, problems may occur, including:  Infection.  Bleeding.  Allergic reactions to medicines.  Damage to other structures or organs.  A stone remaining in the common bile duct.  A bile leak from the cyst duct that is clipped when your gallbladder is removed.  The need to convert to open surgery, which requires a larger incision in the abdomen. This may be necessary if your surgeon thinks that it is not safe to continue with a laparoscopic procedure. BEFORE THE PROCEDURE  Ask your health care provider about:  Changing or stopping your regular medicines. This is especially important if you are taking diabetes medicines or blood thinners.  Taking medicines such as aspirin and ibuprofen. These medicines can thin your blood. Do not take these medicines before your procedure if your health care provider instructs you not to.  Follow instructions from your health care provider about eating or drinking restrictions.  Let your health care provider know if you develop a cold or an infection before surgery.  Plan to have someone take you home after the procedure.  Ask your health care provider how your surgical site will be marked or identified.  You may be given antibiotic medicine to help prevent infection. PROCEDURE  To reduce your risk of infection:  Your health care team will wash or sanitize their hands.  Your skin will be washed with soap.  An IV tube may be inserted into one of your veins.  You will be given a medicine to make  you fall asleep (general anesthetic).  A breathing tube will be placed in your mouth.  The surgeon will make several small cuts (incisions) in your abdomen.  A thin, lighted tube (laparoscope) that has a tiny camera on the end will be inserted through one of the small incisions. The camera on the laparoscope will send a picture to  a TV screen (monitor) in the operating room. This will give the surgeon a good view inside your abdomen.  A gas will be pumped into your abdomen. This will expand your abdomen to give the surgeon more room to perform the surgery.  Other tools that are needed for the procedure will be inserted through the other incisions. The gallbladder will be removed through one of the incisions.  After your gallbladder has been removed, the incisions will be closed with stitches (sutures), staples, or skin glue.  Your incisions may be covered with a bandage (dressing). The procedure may vary among health care providers and hospitals. AFTER THE PROCEDURE  Your blood pressure, heart rate, breathing rate, and blood oxygen level will be monitored often until the medicines you were given have worn off.  You will be given medicines as needed to control your pain.   This information is not intended to replace advice given to you by your health care provider. Make sure you discuss any questions you have with your health care provider.   Document Released: 02/08/2005 Document Revised: 10/30/2014 Document Reviewed: 09/20/2012 Elsevier Interactive Patient Education 2016 [place] Diet for Gallbladder Conditions A low-fat diet can be helpful if you have pancreatitis or a gallbladder condition. With these conditions, your pancreas and gallbladder have trouble digesting fats. A healthy eating plan with less fat will help rest your pancreas and gallbladder and reduce your symptoms. WHAT DO I NEED TO KNOW ABOUT THIS DIET?  Eat a low-fat diet.  Reduce your fat intake to less than 20-30% of your total daily calories. This is less than 50-60 g of fat per day.  Remember that you need some fat in your diet. Ask your dietician what your daily goal should be.  Choose nonfat and low-fat healthy foods. Look for the words "nonfat," "low fat," or "fat free."  As a guide, look on the label and choose foods  with less than 3 g of fat per serving. Eat only one serving.  Avoid alcohol.  Do not smoke. If you need help quitting, talk with your health care provider.  Eat small frequent meals instead of three large heavy meals. WHAT FOODS CAN I EAT? Grains Include healthy grains and starches such as potatoes, wheat bread, fiber-rich cereal, and brown rice. Choose whole grain options whenever possible. In adults, whole grains should account for 45-65% of your daily calories.  Fruits and Vegetables Eat plenty of fruits and vegetables. Fresh fruits and vegetables add fiber to your diet. Meats and Other Protein Sources Eat lean meat such as chicken and pork. Trim any fat off of meat before cooking it. Eggs, fish, and beans are other sources of protein. In adults, these foods should account for 10-35% of your daily calories. Dairy Choose low-fat milk and dairy options. Dairy includes fat and protein, as well as calcium.  Fats and Oils Limit high-fat foods such as fried foods, sweets, baked goods, sugary drinks.  Other Creamy sauces and condiments, such as mayonnaise, can add extra fat. Think about whether or not you need to use them, or use smaller amounts or low fat options.  WHAT FOODS ARE NOT RECOMMENDED?  High fat foods, such as:  [employer].  Ice cream.  [place] toast.  Sweet rolls.  Pizza.  Cheese bread.  Foods covered with batter, butter, creamy sauces, or cheese.  Fried foods.  Sugary drinks and desserts.  Foods that cause gas or bloating   This information is not intended to replace advice given to you by your health care provider. Make sure you discuss any questions you have with your health care provider.   Document Released: 02/13/2013 Document Reviewed: 02/13/2013 Elsevier Interactive Patient Education [employer].   Laparoscopic Cholecystectomy, Care After This sheet gives you information about how to care for yourself after your procedure. Your doctor may also  give you more specific instructions. If you have problems or questions, contact your doctor. Follow these instructions at home: Care for cuts from surgery (incisions)   Follow instructions from your doctor about how to take care of your cuts from surgery. Make sure you: ? Wash your hands with soap and water before you change your bandage (dressing). If you cannot use soap and water, use hand sanitizer. ? Change your bandage as told by your doctor. ? Leave stitches (sutures), skin glue, or skin tape (adhesive) strips in place. They may need to stay in place for 2 weeks or longer. If tape strips get loose and curl up, you may trim the loose edges. Do not remove tape strips completely unless your doctor says it is okay.  Do not take baths, swim, or use a hot tub until your doctor says it is okay. Ask your doctor if you can take showers. You may only be allowed to take sponge baths for bathing.  Check your surgical cut area every day for signs of infection. Check for: ? More redness, swelling, or pain. ? More fluid or blood. ? Warmth. ? Pus or a bad smell. Activity  Do not drive or use heavy machinery while taking prescription pain medicine.  Do not lift anything that is heavier than 10 lb (4.5 kg) until your doctor says it is okay.  Do not play contact sports until your doctor says it is okay.  Do not drive for 24 hours if you were given a medicine to help you relax (sedative).  Rest as needed. Do not return to work or school until your doctor says it is okay. General instructions  Take over-the-counter and prescription medicines only as told by your doctor.  To prevent or treat constipation while you are taking prescription pain medicine, your doctor may recommend that you: ? Drink enough fluid to keep your pee (urine) clear or pale yellow. ? Take over-the-counter or prescription medicines. ? Eat foods that are high in fiber, such as fresh fruits and vegetables, whole grains, and  beans. ? Limit foods that are high in fat and processed sugars, such as fried and sweet foods. Contact a doctor if:  You develop a rash.  You have more redness, swelling, or pain around your surgical cuts.  You have more fluid or blood coming from your surgical cuts.  Your surgical cuts feel warm to the touch.  You have pus or a bad smell coming from your surgical cuts.  You have a fever.  One or more of your surgical cuts breaks open. Get help right away if:  You have trouble breathing.  You have chest pain.  You have pain that is getting worse in your shoulders.  You faint or feel dizzy when you stand.  You have very bad pain in your belly (abdomen).  You are sick to your stomach (nauseous) for more than one day.  You have throwing up (vomiting) that lasts for more than one day.  You have leg pain. This information is not intended to replace advice given to you by your health care provider. Make sure you discuss any questions you have with your health care provider. Document Released: 11/18/2007 Document Revised: 08/30/2015 Document Reviewed: 07/28/2015 Elsevier Interactive Patient Education  2018 [employer].

## 2017-11-11 ENCOUNTER — Other Ambulatory Visit: Payer: Self-pay

## 2017-11-11 ENCOUNTER — Encounter
Admission: RE | Admit: 2017-11-11 | Discharge: 2017-11-11 | Disposition: A | Discharge status: Home / Self Care | Payer: Federal, State, Local not specified - PPO | Source: Ambulatory Visit | Attending: Surgery | Admitting: Surgery

## 2017-11-11 NOTE — Patient Instructions (Signed)
Your procedure is scheduled on: 11-16-17 [facility] Report to Same Day Surgery 2nd floor medical mall [facility] Entrance-take elevator on left to 2nd floor.  Check in with surgery information desk.) To find out your arrival time please call [phone] between 1PM - 3PM on 11-15-17 TUESDAY  Remember: Instructions that are not followed completely may result in serious medical risk, up to and including death, or upon the discretion of your surgeon and anesthesiologist your surgery may need to be rescheduled.    _x___ 1. Do not eat food after midnight the night before your procedure. NO GUM OR CANDY AFTER MIDNIGHT.  You may drink clear liquids up to 2 hours before you are scheduled to arrive at the hospital for your procedure.  Do not drink clear liquids within 2 hours of your scheduled arrival to the hospital.  Clear liquids include  --Water or Apple juice without pulp  --Clear carbohydrate beverage such as ClearFast or Gatorade  --Black Coffee or Clear Tea (No milk, no creamers, do not add anything to the coffee or Tea   ____Ensure clear carbohydrate drink on the way to the hospital for bariatric patients  ____Ensure clear carbohydrate drink 3 hours before surgery for Dr Dwyane Luo patients if physician instructed.     __x__ 2. No Alcohol for 24 hours before or after surgery.   __x__3. No Smoking or e-cigarettes for 24 prior to surgery.  Do not use any chewable tobacco products for at least 6 hour prior to surgery   ____  4. Bring all medications with you on the day of surgery if instructed.    __x__ 5. Notify your doctor if there is any change in your medical condition     (cold, fever, infections).    x___6. On the morning of surgery brush your teeth with toothpaste and water.  You may rinse your mouth with mouth wash if you wish.  Do not swallow any toothpaste or mouthwash.   Do not wear jewelry, make-up, hairpins, clips or nail polish.  Do not wear lotions, powders, or perfumes.  You may wear deodorant.  Do not shave 48 hours prior to surgery. Men may shave face and neck.  Do not bring valuables to the hospital.    [facility] is not responsible for any belongings or valuables.               Contacts, dentures or bridgework may not be worn into surgery.  Leave your suitcase in the car. After surgery it may be brought to your room.  For patients admitted to the hospital, discharge time is determined by your treatment team.  _  Patients discharged the day of surgery will not be allowed to drive home.  You will need someone to drive you home and stay with you the night of your procedure.    Please read over the following fact sheets that you were given:   [facility] Preparing for Surgery   ____ Take anti-hypertensive listed below, cardiac, seizure, asthma, anti-reflux and psychiatric medicines. These include:  1. NONE  2.  3.  4.  5.  6.  ____Fleets enema or Magnesium Citrate as directed.   _x___ Use CHG Soap or sage wipes as directed on instruction sheet   ____ Use inhalers on the day of surgery and bring to hospital day of surgery  ____ Stop Metformin and Janumet 2 days prior to surgery.    ____ Take 1/2 of usual insulin dose the night before surgery and none  on the morning surgery.   ____ Follow recommendations from Cardiologist, Pulmonologist or PCP regarding stopping Aspirin, Coumadin, Plavix ,Eliquis, Effient, or Pradaxa, and Pletal.  X____Stop Anti-inflammatories such as Advil, Aleve, Ibuprofen, Motrin, Naproxen, MELOXICAM, Naprosyn, Goodies powders or aspirin products NOW-OK to take Tylenol   _x___ Stop supplements until after surgery-STOP GLUCOSAMINE-CHONDROITIN NOW-MAY RESUME AFTER SURGERY   ____ Bring C-Pap to the hospital.

## 2017-11-14 ENCOUNTER — Encounter
Admission: RE | Admit: 2017-11-14 | Discharge: 2017-11-14 | Disposition: A | Discharge status: Home / Self Care | Payer: Federal, State, Local not specified - PPO | Source: Ambulatory Visit | Attending: Surgery | Admitting: Surgery

## 2017-11-14 DIAGNOSIS — I10 Essential (primary) hypertension: Secondary | ICD-10-CM | POA: Diagnosis not present

## 2017-11-14 DIAGNOSIS — Z01818 Encounter for other preprocedural examination: Secondary | ICD-10-CM | POA: Insufficient documentation

## 2017-11-14 LAB — COMPREHENSIVE METABOLIC PANEL
ALBUMIN: 4 g/dL (ref 3.5–5.0)
ALK PHOS: 70 U/L (ref 38–126)
ALT: 17 U/L (ref 0–44)
AST: 20 U/L (ref 15–41)
Anion gap: 7 (ref 5–15)
BILIRUBIN TOTAL: 0.6 mg/dL (ref 0.3–1.2)
BUN: 12 mg/dL (ref 8–23)
CALCIUM: 9.2 mg/dL (ref 8.9–10.3)
CO2: 28 mmol/L (ref 22–32)
CREATININE: 0.69 mg/dL (ref 0.44–1.00)
Chloride: 104 mmol/L (ref 98–111)
GFR calc Af Amer: >60 mL/min (ref >60)
GLUCOSE: 89 mg/dL (ref 70–99)
Potassium: 3.9 mmol/L (ref 3.5–5.1)
Sodium: 139 mmol/L (ref 135–145)
TOTAL PROTEIN: 7.2 g/dL (ref 6.5–8.1)

## 2017-11-14 LAB — CBC WITH DIFFERENTIAL/PLATELET
BASOS ABS: 0 10*3/uL (ref 0–0.1)
BASOS PCT: 1 %
Eosinophils Absolute: 0.1 10*3/uL (ref 0–0.7)
Eosinophils Relative: 1 %
HEMATOCRIT: 37.6 % (ref 35.0–47.0)
HEMOGLOBIN: 12.9 g/dL (ref 12.0–16.0)
LYMPHS PCT: 29 %
Lymphs Abs: 1.9 10*3/uL (ref 1.0–3.6)
MCH: 30.1 pg (ref 26.0–34.0)
MCHC: 34.2 g/dL (ref 32.0–36.0)
MCV: 87.9 fL (ref 80.0–100.0)
MONO ABS: 0.4 10*3/uL (ref 0.2–0.9)
MONOS PCT: 6 %
NEUTROS ABS: 4.1 10*3/uL (ref 1.4–6.5)
NEUTROS PCT: 63 %
Platelets: 225 10*3/uL (ref 150–440)
RBC: 4.28 MIL/uL (ref 3.80–5.20)
RDW: 13.2 % (ref 11.5–14.5)
WBC: 6.5 10*3/uL (ref 3.6–11.0)

## 2017-11-15 MED ORDER — CEFAZOLIN SODIUM-DEXTROSE 2-4 GM/100ML-% IV SOLN
2.0000 g | INTRAVENOUS | Status: AC
  Administered 2017-11-16: 2 g via INTRAVENOUS

## 2017-11-16 ENCOUNTER — Ambulatory Visit: Payer: Federal, State, Local not specified - PPO | Admitting: Anesthesiology

## 2017-11-16 ENCOUNTER — Encounter
Admission: RE | Disposition: A | Discharge status: Home / Self Care | Payer: Self-pay | Source: Ambulatory Visit | Attending: Surgery

## 2017-11-16 ENCOUNTER — Telehealth: Payer: Self-pay | Admitting: Family Medicine

## 2017-11-16 ENCOUNTER — Other Ambulatory Visit: Payer: Self-pay

## 2017-11-16 ENCOUNTER — Ambulatory Visit
Admission: RE | Admit: 2017-11-16 | Discharge: 2017-11-16 | Disposition: A | Discharge status: Home / Self Care | Payer: Federal, State, Local not specified - PPO | Source: Ambulatory Visit | Attending: Surgery | Admitting: Surgery

## 2017-11-16 DIAGNOSIS — E786 Lipoprotein deficiency: Secondary | ICD-10-CM

## 2017-11-16 DIAGNOSIS — I10 Essential (primary) hypertension: Secondary | ICD-10-CM | POA: Diagnosis not present

## 2017-11-16 DIAGNOSIS — K828 Other specified diseases of gallbladder: Secondary | ICD-10-CM | POA: Diagnosis not present

## 2017-11-16 DIAGNOSIS — Z79899 Other long term (current) drug therapy: Secondary | ICD-10-CM | POA: Diagnosis not present

## 2017-11-16 DIAGNOSIS — M1990 Unspecified osteoarthritis, unspecified site: Secondary | ICD-10-CM | POA: Diagnosis not present

## 2017-11-16 DIAGNOSIS — M8580 Other specified disorders of bone density and structure, unspecified site: Secondary | ICD-10-CM

## 2017-11-16 DIAGNOSIS — Z8542 Personal history of malignant neoplasm of other parts of uterus: Secondary | ICD-10-CM | POA: Insufficient documentation

## 2017-11-16 DIAGNOSIS — K811 Chronic cholecystitis: Secondary | ICD-10-CM | POA: Diagnosis not present

## 2017-11-16 DIAGNOSIS — Z90710 Acquired absence of both cervix and uterus: Secondary | ICD-10-CM | POA: Insufficient documentation

## 2017-11-16 DIAGNOSIS — Z923 Personal history of irradiation: Secondary | ICD-10-CM | POA: Diagnosis not present

## 2017-11-16 DIAGNOSIS — K819 Cholecystitis, unspecified: Secondary | ICD-10-CM | POA: Diagnosis not present

## 2017-11-16 DIAGNOSIS — Z9221 Personal history of antineoplastic chemotherapy: Secondary | ICD-10-CM | POA: Diagnosis not present

## 2017-11-16 DIAGNOSIS — K5900 Constipation, unspecified: Secondary | ICD-10-CM | POA: Insufficient documentation

## 2017-11-16 HISTORY — PX: CHOLECYSTECTOMY: SHX55

## 2017-11-16 SURGERY — LAPAROSCOPIC CHOLECYSTECTOMY
Anesthesia: General | Site: Abdomen

## 2017-11-16 MED ORDER — SUGAMMADEX SODIUM 200 MG/2ML IV SOLN
INTRAVENOUS | Status: AC
  Filled 2017-11-16: qty 2

## 2017-11-16 MED ORDER — PROPOFOL 10 MG/ML IV BOLUS
INTRAVENOUS | Status: DC | PRN
  Administered 2017-11-16: 190 mg via INTRAVENOUS

## 2017-11-16 MED ORDER — OXYCODONE-ACETAMINOPHEN 5-325 MG PO TABS
1.0000 | ORAL_TABLET | ORAL | Status: DC | PRN
  Administered 2017-11-16: 1 via ORAL

## 2017-11-16 MED ORDER — FENTANYL CITRATE (PF) 100 MCG/2ML IJ SOLN
25.0000 ug | INTRAMUSCULAR | Status: DC | PRN
  Administered 2017-11-16: 50 ug via INTRAVENOUS
  Administered 2017-11-16 (×2): 25 ug via INTRAVENOUS

## 2017-11-16 MED ORDER — ONDANSETRON HCL 4 MG/2ML IJ SOLN: 4.0000 mg | Freq: Once | INTRAMUSCULAR | Status: DC | PRN

## 2017-11-16 MED ORDER — ROCURONIUM BROMIDE 100 MG/10ML IV SOLN
INTRAVENOUS | Status: DC | PRN
  Administered 2017-11-16: 40 mg via INTRAVENOUS
  Administered 2017-11-16: 10 mg via INTRAVENOUS
  Administered 2017-11-16: 15 mg via INTRAVENOUS

## 2017-11-16 MED ORDER — EPHEDRINE SULFATE 50 MG/ML IJ SOLN
INTRAMUSCULAR | Status: AC
  Filled 2017-11-16: qty 1

## 2017-11-16 MED ORDER — CHLORHEXIDINE GLUCONATE CLOTH 2 % EX PADS: 6.0000 | MEDICATED_PAD | Freq: Once | CUTANEOUS | Status: DC

## 2017-11-16 MED ORDER — FAMOTIDINE 20 MG PO TABS
ORAL_TABLET | ORAL | Status: AC
  Administered 2017-11-16: 20 mg via ORAL
  Filled 2017-11-16: qty 1

## 2017-11-16 MED ORDER — DEXAMETHASONE SODIUM PHOSPHATE 10 MG/ML IJ SOLN
INTRAMUSCULAR | Status: AC
  Filled 2017-11-16: qty 1

## 2017-11-16 MED ORDER — ONDANSETRON HCL 4 MG/2ML IJ SOLN
INTRAMUSCULAR | Status: AC
  Filled 2017-11-16: qty 2

## 2017-11-16 MED ORDER — MIDAZOLAM HCL 2 MG/2ML IJ SOLN
INTRAMUSCULAR | Status: AC
  Filled 2017-11-16: qty 2

## 2017-11-16 MED ORDER — LIDOCAINE HCL (CARDIAC) PF 100 MG/5ML IV SOSY: PREFILLED_SYRINGE | INTRAVENOUS | Status: DC | PRN

## 2017-11-16 MED ORDER — FENTANYL CITRATE (PF) 100 MCG/2ML IJ SOLN
INTRAMUSCULAR | Status: AC
  Administered 2017-11-16: 25 ug via INTRAVENOUS
  Filled 2017-11-16: qty 2

## 2017-11-16 MED ORDER — ONDANSETRON HCL 4 MG/2ML IJ SOLN
INTRAMUSCULAR | Status: DC | PRN
  Administered 2017-11-16: 4 mg via INTRAVENOUS

## 2017-11-16 MED ORDER — OXYCODONE-ACETAMINOPHEN 5-325 MG PO TABS: 1.0000 | ORAL_TABLET | ORAL | 0 refills | Status: DC | PRN

## 2017-11-16 MED ORDER — PROPOFOL 10 MG/ML IV BOLUS
INTRAVENOUS | Status: AC
  Filled 2017-11-16: qty 20

## 2017-11-16 MED ORDER — ROCURONIUM BROMIDE 50 MG/5ML IV SOLN
INTRAVENOUS | Status: AC
  Filled 2017-11-16: qty 1

## 2017-11-16 MED ORDER — LIDOCAINE HCL 1 % IJ SOLN
INTRAMUSCULAR | Status: DC | PRN
  Administered 2017-11-16: 20 mL

## 2017-11-16 MED ORDER — FENTANYL CITRATE (PF) 100 MCG/2ML IJ SOLN
INTRAMUSCULAR | Status: AC
  Filled 2017-11-16: qty 2

## 2017-11-16 MED ORDER — FENTANYL CITRATE (PF) 100 MCG/2ML IJ SOLN
INTRAMUSCULAR | Status: DC | PRN
  Administered 2017-11-16: 50 ug via INTRAVENOUS

## 2017-11-16 MED ORDER — PHENYLEPHRINE HCL 10 MG/ML IJ SOLN
INTRAMUSCULAR | Status: DC | PRN
  Administered 2017-11-16: 100 ug via INTRAVENOUS

## 2017-11-16 MED ORDER — SUGAMMADEX SODIUM 500 MG/5ML IV SOLN
INTRAVENOUS | Status: DC | PRN
  Administered 2017-11-16: 200 mg via INTRAVENOUS

## 2017-11-16 MED ORDER — ACETAMINOPHEN 500 MG PO TABS
ORAL_TABLET | ORAL | Status: AC
  Administered 2017-11-16: 1000 mg via ORAL
  Filled 2017-11-16: qty 2

## 2017-11-16 MED ORDER — DEXAMETHASONE SODIUM PHOSPHATE 10 MG/ML IJ SOLN
INTRAMUSCULAR | Status: DC | PRN
  Administered 2017-11-16: 10 mg via INTRAVENOUS

## 2017-11-16 MED ORDER — CEFAZOLIN SODIUM-DEXTROSE 2-4 GM/100ML-% IV SOLN
INTRAVENOUS | Status: AC
  Filled 2017-11-16: qty 100

## 2017-11-16 MED ORDER — LACTATED RINGERS IV SOLN
INTRAVENOUS | Status: DC
  Administered 2017-11-16 (×2): via INTRAVENOUS

## 2017-11-16 MED ORDER — MIDAZOLAM HCL 2 MG/2ML IJ SOLN
INTRAMUSCULAR | Status: DC | PRN
  Administered 2017-11-16: 2 mg via INTRAVENOUS

## 2017-11-16 MED ORDER — ACETAMINOPHEN 500 MG PO TABS
1000.0000 mg | ORAL_TABLET | ORAL | Status: AC
  Administered 2017-11-16: 1000 mg via ORAL

## 2017-11-16 MED ORDER — FAMOTIDINE 20 MG PO TABS
20.0000 mg | ORAL_TABLET | Freq: Once | ORAL | Status: AC
  Administered 2017-11-16: 20 mg via ORAL

## 2017-11-16 SURGICAL SUPPLY — 36 items
APPLIER CLIP ROT 10 11.4 M/L (STAPLE) ×2
CHLORAPREP W/TINT 26ML (MISCELLANEOUS) ×2 IMPLANT
CLIP APPLIE ROT 10 11.4 M/L (STAPLE) ×1 IMPLANT
DECANTER SPIKE VIAL GLASS SM (MISCELLANEOUS) ×4 IMPLANT
DERMABOND ADVANCED (GAUZE/BANDAGES/DRESSINGS) ×1
DERMABOND ADVANCED .7 DNX12 (GAUZE/BANDAGES/DRESSINGS) ×1 IMPLANT
DRESSING SURGICEL FIBRLLR 1X2 (HEMOSTASIS) IMPLANT
DRSG SURGICEL FIBRILLAR 1X2 (HEMOSTASIS)
ELECT REM PT RETURN 9FT ADLT (ELECTROSURGICAL) ×2
ELECTRODE REM PT RTRN 9FT ADLT (ELECTROSURGICAL) ×1 IMPLANT
GLOVE BIO SURGEON STRL SZ7 (GLOVE) ×2 IMPLANT
GLOVE BIOGEL PI IND STRL 7.5 (GLOVE) ×1 IMPLANT
GLOVE BIOGEL PI INDICATOR 7.5 (GLOVE) ×1
GOWN STRL REUS W/ TWL LRG LVL3 (GOWN DISPOSABLE) ×3 IMPLANT
GOWN STRL REUS W/TWL LRG LVL3 (GOWN DISPOSABLE) ×3
GRASPER SUT TROCAR 14GX15 (MISCELLANEOUS) ×2 IMPLANT
IRRIGATION STRYKERFLOW (MISCELLANEOUS) IMPLANT
IRRIGATOR STRYKERFLOW (MISCELLANEOUS)
IV NS 1000ML (IV SOLUTION) ×1
IV NS 1000ML BAXH (IV SOLUTION) ×1 IMPLANT
KIT TURNOVER KIT A (KITS) ×2 IMPLANT
NEEDLE HYPO 22GX1.5 SAFETY (NEEDLE) ×2 IMPLANT
NEEDLE INSUFFLATION 14GA 120MM (NEEDLE) ×2 IMPLANT
NS IRRIG 1000ML POUR BTL (IV SOLUTION) ×2 IMPLANT
PACK LAP CHOLECYSTECTOMY (MISCELLANEOUS) ×2 IMPLANT
PORT ACCESS TROCAR AIRSEAL 12 (TROCAR) ×1 IMPLANT
PORT ACCESS TROCAR AIRSEAL 5M (TROCAR) ×1
POUCH SPECIMEN RETRIEVAL 10MM (ENDOMECHANICALS) ×2 IMPLANT
SCISSORS METZENBAUM CVD 33 (INSTRUMENTS) IMPLANT
SLEEVE ENDOPATH XCEL 5M (ENDOMECHANICALS) ×4 IMPLANT
SUT MNCRL AB 4-0 PS2 18 (SUTURE) ×2 IMPLANT
SUT VICRYL 0 UR6 27IN ABS (SUTURE) ×2 IMPLANT
SUT VICRYL AB 3-0 FS1 BRD 27IN (SUTURE) ×2 IMPLANT
TROCAR XCEL NON-BLD 11X100MML (ENDOMECHANICALS) IMPLANT
TROCAR XCEL NON-BLD 5MMX100MML (ENDOMECHANICALS) ×6 IMPLANT
TUBING INSUFFLATION (TUBING) ×2 IMPLANT

## 2017-11-16 NOTE — Telephone Encounter (Signed)
-----   Message from Lendon Collar, RT sent at 11/14/2017 10:03 AM EDT ----- Regarding: Lab orders for Friday 11/25/17 Please enter CPE lab orders for 11/25/17. Thanks!

## 2017-11-16 NOTE — Transfer of Care (Signed)
Immediate Anesthesia Transfer of Care Note  Patient: Melissa [name]  Procedure(s) Performed: LAPAROSCOPIC CHOLECYSTECTOMY (N/A Abdomen)  Patient Location: PACU  Anesthesia Type:General  Level of Consciousness: sedated and drowsy  Airway & Oxygen Therapy: Patient Spontanous Breathing and Patient connected to face mask oxygen  Post-op Assessment: Report given to RN and Post -op Vital signs reviewed and stable  Post vital signs: Reviewed and stable  Last Vitals:  Vitals Value Taken Time  BP 134/89 11/16/2017  1:18 PM  Temp    Pulse 55 11/16/2017  1:21 PM  Resp 20 11/16/2017  1:21 PM  SpO2 100 % 11/16/2017  1:21 PM  Vitals shown include unvalidated device data.  Last Pain:  Vitals:   11/16/17 1104  TempSrc: Temporal  PainSc: 0-No pain         Complications: No apparent anesthesia complications

## 2017-11-16 NOTE — Anesthesia Post-op Follow-up Note (Signed)
Anesthesia QCDR form completed.        

## 2017-11-16 NOTE — Op Note (Signed)
SURGICAL OPERATIVE REPORT   DATE OF PROCEDURE: 11/16/2017  ATTENDING Surgeon(s): Vickie Epley, MD  ASSISTANT(S): Otho Ket, PA-C   ANESTHESIA: GETA  PRE-OPERATIVE DIAGNOSIS: Biliary Dyskinesia (K81.9)  POST-OPERATIVE DIAGNOSIS: Chronic cholecystitis (K81.1)  PROCEDURE(S): (cpt's: 16073) 1.) Laparoscopic Cholecystectomy  INTRAOPERATIVE FINDINGS: Moderate pericholecystic inflammatory omental adhesions without appreciable gallbladder wall thickening, cystic duct and cystic artery clips well-secured, hemostasis at completion of procedure  INTRAOPERATIVE FLUIDS: 700 mL crystalloid   ESTIMATED BLOOD LOSS: Minimal (<30 mL)   URINE OUTPUT: No foley  SPECIMENS: Gallbladder  IMPLANTS: None  DRAINS: None   COMPLICATIONS: None apparent   CONDITION AT COMPLETION: Hemodynamically stable and extubated  DISPOSITION: PACU   INDICATION(S) FOR PROCEDURE:  Patient is a 62 y.o. female who recently presented with post-prandial RUQ > epigastric abdominal pain after eating fatty foods in particular. HIDA suggested biliary dyskinesia without cholelithiasis or sonographic evidence of cholecystitis on ultrasound. All risks, benefits, and alternatives to above elective procedures were discussed with the patient, who elected to proceed, and informed consent was accordingly obtained at that time.   DETAILS OF PROCEDURE:  Patient was brought to the operating suite and appropriately identified. General anesthesia was administered along with peri-operative prophylactic IV antibiotics, and endotracheal intubation was performed by anesthesiologist, along with NG/OG tube for gastric decompression. In supine position, operative site was prepped and draped in usual sterile fashion, and following a brief time out, initial 5 mm incision was made in a natural skin crease just abovethe umbilicus. Fascia was then elevated, and a Verress needle was inserted and its proper position confirmed using aspiration  and saline meniscus test.  Upon insufflation of the abdominal cavity with carbon dioxide to a well-tolerated pressure of 12-15 mmHg, 5 mm peri-umbilical port followed by laparoscope were inserted and used to inspect the abdominal cavity and its contents with no injuries from insertion of the first trochar noted. Three additional trocars were inserted, one at the epigastric position (12 mm AirSeal) and two along the Right costal margin (5 mm). The table was then placed in reverse Trendelenburg position with the Right side up. Insufflation pressure was decreased to 10 mmHg and then 8 mmHg. Filmy adhesions between the gallbladder and omentum/duodenum/transverse colon were lysed using combined blunt dissection and selective electrocautery. The apex/dome of the gallbladder was grasped with an atraumatic grasper passed through the lateral port and retracted apically over the liver. The infundibulum was also grasped and retracted, exposing Calot's triangle. The peritoneum overlying the gallbladder infundibulum was incised and dissected free of surrounding peritoneal attachments, revealing the cystic duct and cystic artery, which were clipped twice on the patient side and once on the gallbladder specimen side close to the gallbladder. The gallbladder was then dissected from its peritoneal attachments to the liver using electrocautery, and the gallbladder was placed into a laparoscopic specimen bag and removed from the abdominal cavity via the epigastric port site. Hemostasis and secure placement of clips were confirmed, and intra-peritoneal cavity was inspected with no additional findings. PMI laparoscopic fascial closure device was then used to re-approximate fascia at the 10 mm epigastric port site.  All ports were then removed under direct visualization, and abdominal cavity was desuflated. All port sites were irrigated/cleaned, additional local anesthetic was injected at each incision, 3-0 Vicryl was used to  re-approximate dermis at 10 mm port site(s), and subcuticular 4-0 Monocryl suture was used to re-approximate skin. Skin was then cleaned, dried, and sterile skin glue was applied. Patient was then safely able to be awakened,  extubated, and transferred to PACU for post-operative monitoring and care.   I was present for all aspects of the above procedure, and no operative complications were apparent.

## 2017-11-16 NOTE — Anesthesia Procedure Notes (Signed)
Procedure Name: Intubation Date/Time: 11/16/2017 12:00 PM Performed by: Allean Found, CRNA Pre-anesthesia Checklist: Patient identified, Patient being monitored, Timeout performed, Emergency Drugs available and Suction available Patient Re-evaluated:Patient Re-evaluated prior to induction Oxygen Delivery Method: Circle system utilized Preoxygenation: Pre-oxygenation with 100% oxygen Induction Type: IV induction Ventilation: Mask ventilation without difficulty Laryngoscope Size: Mac and 3 Grade View: Grade I Tube type: Oral Tube size: 7.0 mm Number of attempts: 1 Airway Equipment and Method: Stylet Placement Confirmation: ETT inserted through vocal cords under direct vision,  positive ETCO2 and breath sounds checked- equal and bilateral Secured at: 21 cm Tube secured with: Tape Dental Injury: Teeth and Oropharynx as per pre-operative assessment

## 2017-11-16 NOTE — Interval H&P Note (Signed)
History and Physical Interval Note:  11/16/2017 11:39 AM  [name]  has presented today for surgery, with the diagnosis of BILIARY DYSKINESIA  The various methods of treatment have been discussed with the patient and family. After consideration of risks, benefits and other options for treatment, the patient has consented to  Procedure(s): LAPAROSCOPIC CHOLECYSTECTOMY (N/A) as a surgical intervention .  The patient's history has been reviewed, patient examined, no change in status, stable for surgery.  I have reviewed the patient's chart and labs.  Questions were answered to the patient's satisfaction.     Vickie Epley

## 2017-11-16 NOTE — OR Nursing (Signed)
Dr Kayleen Memos reviewed EKG

## 2017-11-16 NOTE — Anesthesia Preprocedure Evaluation (Signed)
Anesthesia Evaluation  Patient identified by MRN, date of birth, ID band Patient awake    Reviewed: Allergy & Precautions, NPO status , Patient's Chart, lab work & pertinent test results  Airway Mallampati: II  TM Distance: >3 FB     Dental  (+) Caps   Pulmonary neg pulmonary ROS,    Pulmonary exam normal        Cardiovascular hypertension, Pt. on medications Normal cardiovascular exam     Neuro/Psych negative neurological ROS  negative psych ROS   GI/Hepatic Neg liver ROS,   Endo/Other  negative endocrine ROS  Renal/GU negative Renal ROS  Female GU complaint     Musculoskeletal  (+) Arthritis , Osteoarthritis,    Abdominal Normal abdominal exam  (+)   Peds negative pediatric ROS (+)  Hematology negative hematology ROS (+)   Anesthesia Other Findings   Reproductive/Obstetrics                             Anesthesia Physical  Anesthesia Plan  ASA: II  Anesthesia Plan: General   Post-op Pain Management:    Induction: Intravenous  PONV Risk Score and Plan:   Airway Management Planned: Oral ETT  Additional Equipment:   Intra-op Plan:   Post-operative Plan: Extubation in OR  Informed Consent: I have reviewed the patients History and Physical, chart, labs and discussed the procedure including the risks, benefits and alternatives for the proposed anesthesia with the patient or authorized representative who has indicated his/her understanding and acceptance.     Dental advisory given  Plan Discussed with: CRNA and Surgeon  Anesthesia Plan Comments:         Anesthesia Quick Evaluation  

## 2017-11-16 NOTE — Discharge Instructions (Signed)
In addition to included general post-operative instructions for Laparoscopic Cholecystectomy,  Diet: Resume home heart healthy diet.   Activity: No heavy lifting >20 pounds (children, pets, laundry, garbage) or strenuous activity until follow-up, but light activity and walking are encouraged. Do not drive or drink alcohol if taking narcotic pain medications.  Wound care: 2 days after surgery (Friday, 9/27), you may shower/get incision wet with soapy water and pat dry (do not rub incisions), but no baths or submerging incision underwater until follow-up.   Medications: Resume all home medications. For mild to moderate pain: acetaminophen (Tylenol) or ibuprofen/naproxen (if no kidney disease). Combining Tylenol with alcohol can substantially increase your risk of causing liver disease. Narcotic pain medications, if prescribed, can be used for severe pain, though may cause nausea, constipation, and drowsiness. Do not combine Tylenol and Percocet (or similar) within a 6 hour period as Percocet (and similar) contain(s) Tylenol. If you do not need the narcotic pain medication, you do not need to fill the prescription.  Call office [phone]) at any time if any questions, worsening pain, fevers/chills, bleeding, drainage from incision site, or other concerns.

## 2017-11-17 ENCOUNTER — Encounter: Payer: Self-pay | Admitting: Surgery

## 2017-11-17 LAB — SURGICAL PATHOLOGY

## 2017-11-18 NOTE — Anesthesia Postprocedure Evaluation (Signed)
Anesthesia Post Note  Patient: Melissa [name]  Procedure(s) Performed: LAPAROSCOPIC CHOLECYSTECTOMY (N/A Abdomen)  Patient location during evaluation: PACU Anesthesia Type: General Level of consciousness: awake and alert and oriented Pain management: pain level controlled Vital Signs Assessment: post-procedure vital signs reviewed and stable Respiratory status: spontaneous breathing Cardiovascular status: blood pressure returned to baseline Anesthetic complications: no     Last Vitals:  Vitals:   11/16/17 1420 11/16/17 1509  BP: (!) 162/83 126/80  Pulse: (!) 45 (!) 54  Resp: 16 16  Temp: (!) 36.1 C   SpO2: 100%     Last Pain:  Vitals:   11/16/17 1420  TempSrc: Temporal  PainSc: 6                  Kristy Catoe

## 2017-11-21 DIAGNOSIS — K811 Chronic cholecystitis: Secondary | ICD-10-CM

## 2017-11-25 ENCOUNTER — Other Ambulatory Visit (INDEPENDENT_AMBULATORY_CARE_PROVIDER_SITE_OTHER): Payer: Federal, State, Local not specified - PPO

## 2017-11-25 DIAGNOSIS — M8580 Other specified disorders of bone density and structure, unspecified site: Secondary | ICD-10-CM

## 2017-11-25 DIAGNOSIS — E786 Lipoprotein deficiency: Secondary | ICD-10-CM | POA: Diagnosis not present

## 2017-11-25 DIAGNOSIS — I10 Essential (primary) hypertension: Secondary | ICD-10-CM | POA: Diagnosis not present

## 2017-11-25 LAB — COMPREHENSIVE METABOLIC PANEL
ALBUMIN: 4 g/dL (ref 3.5–5.2)
ALK PHOS: 68 U/L (ref 39–117)
ALT: 12 U/L (ref 0–35)
AST: 14 U/L (ref 0–37)
BILIRUBIN TOTAL: 0.5 mg/dL (ref 0.2–1.2)
BUN: 17 mg/dL (ref 6–23)
CO2: 30 mEq/L (ref 19–32)
Calcium: 9.5 mg/dL (ref 8.4–10.5)
Chloride: 102 mEq/L (ref 96–112)
Creatinine, Ser: 0.65 mg/dL (ref 0.40–1.20)
GFR: 97.51 mL/min (ref >60.00)
Glucose, Bld: 90 mg/dL (ref 70–99)
POTASSIUM: 4.4 meq/L (ref 3.5–5.1)
SODIUM: 138 meq/L (ref 135–145)
TOTAL PROTEIN: 7.2 g/dL (ref 6.0–8.3)

## 2017-11-25 LAB — LIPID PANEL
CHOLESTEROL: 135 mg/dL (ref 0–200)
HDL: 37.7 mg/dL — ABNORMAL LOW (ref >39.00)
LDL Cholesterol: 83 mg/dL (ref 0–99)
NonHDL: 97.12
Total CHOL/HDL Ratio: 4
Triglycerides: 71 mg/dL (ref 0.0–149.0)
VLDL: 14.2 mg/dL (ref 0.0–40.0)

## 2017-11-25 LAB — VITAMIN D 25 HYDROXY (VIT D DEFICIENCY, FRACTURES): VITD: 27.89 ng/mL — ABNORMAL LOW (ref 30.00–100.00)

## 2017-12-01 ENCOUNTER — Ambulatory Visit (INDEPENDENT_AMBULATORY_CARE_PROVIDER_SITE_OTHER): Payer: Federal, State, Local not specified - PPO | Admitting: Surgery

## 2017-12-01 ENCOUNTER — Encounter: Payer: Self-pay | Admitting: Surgery

## 2017-12-01 VITALS — BP 137/86 | HR 69 | Temp 97.5°F | Ht 65.5 in | Wt 187.0 lb

## 2017-12-01 DIAGNOSIS — Z4889 Encounter for other specified surgical aftercare: Secondary | ICD-10-CM

## 2017-12-01 DIAGNOSIS — K811 Chronic cholecystitis: Secondary | ICD-10-CM

## 2017-12-01 NOTE — Patient Instructions (Signed)

## 2017-12-01 NOTE — Progress Notes (Signed)
Surgical Clinic Progress/Follow-up Note   HPI:  62 y.o. Female presents to clinic for post-op follow-up 15 Days s/p laparoscopic cholecystectomy Rosana Hoes, 11/16/2017). Patient reports complete resolution of pre-operative pain and has been tolerating regular diet with +flatus and normal BM's, denies N/V, fever/chills, CP, or SOB.  Review of Systems:  Constitutional: denies fever/chills  Respiratory: denies shortness of breath, wheezing  Cardiovascular: denies chest pain, palpitations  Gastrointestinal: abdominal pain, N/V, and bowel function as per interval history Skin: Denies any other rashes or skin discolorations except post-surgical wounds as per interval history  Vital Signs:  BP 137/86   Pulse 69   Temp (!) 97.5 F (36.4 C) (Temporal)   Ht 5' 5.5" (1.664 m)   Wt 187 lb (84.8 kg)   BMI 30.65 kg/m    Physical Exam:  Constitutional:  -- Normal body habitus  -- Awake, alert, and oriented x3  Pulmonary:  -- No crackles -- Equal breath sounds bilaterally -- Breathing non-labored at rest Cardiovascular:  -- S1, S2 present  -- No pericardial rubs  Gastrointestinal:  -- Soft and non-distended, non-tender to palpation, no guarding/rebound tenderness -- Post-surgical incisions all well-approximated without any peri-incisional erythema or drainage -- No abdominal masses appreciated, pulsatile or otherwise  Musculoskeletal / Integumentary:  -- Wounds or skin discoloration: None appreciated except post-surgical incisions as described above (GI) -- Extremities: B/L UE and LE FROM, hands and feet warm, no edema   Imaging: No new pertinent imaging available for review  Assessment:  62 y.o. yo Female with a problem list including...  Patient Active Problem List   Diagnosis Date Noted  . Chronic cholecystitis   . Right upper quadrant pain 05/20/2017  . Osteopenia 12/06/2014  . History of endometrial cancer 03/25/2011  . Low HDL (under 40) 03/23/2010  . OSTEOARTHRITIS, KNEES,  BILATERAL 11/26/2008  . OSTEOARTHRITIS, HANDS, BILATERAL 11/22/2007  . FIBROIDS, UTERUS 09/30/2006  . Obesity with serious comorbidity 09/30/2006  . Essential hypertension, benign 09/30/2006    presents to clinic for post-op follow-up evaluation, doing well 15 Days s/p laparoscopic cholecystectomy Rosana Hoes, 11/16/2017) for chronic cholecystitis.  Plan:              - advance diet as tolerated              - okay to submerge incisions under water (baths, swimming) prn             - gradually resume all activities without restrictions over next 2 weeks             - apply sunblock particularly to incisions with sun exposure to reduce pigmentation of scars             - return to clinic as needed, instructed to call office if any questions or concerns  All of the above recommendations were discussed with the patient, and all of patient's questions were answered to her expressed satisfaction.  -- Marilynne Drivers Rosana Hoes, MD, [place]: [facility] - Partnering for exceptional care. Office: [phone]

## 2017-12-02 ENCOUNTER — Ambulatory Visit (INDEPENDENT_AMBULATORY_CARE_PROVIDER_SITE_OTHER): Payer: Federal, State, Local not specified - PPO | Admitting: Family Medicine

## 2017-12-02 ENCOUNTER — Encounter: Payer: Self-pay | Admitting: Family Medicine

## 2017-12-02 VITALS — BP 120/80 | HR 73 | Temp 98.4°F | Ht 65.5 in | Wt 185.0 lb

## 2017-12-02 DIAGNOSIS — E786 Lipoprotein deficiency: Secondary | ICD-10-CM

## 2017-12-02 DIAGNOSIS — E6609 Other obesity due to excess calories: Secondary | ICD-10-CM

## 2017-12-02 DIAGNOSIS — Z0000 Encounter for general adult medical examination without abnormal findings: Secondary | ICD-10-CM

## 2017-12-02 DIAGNOSIS — Z8542 Personal history of malignant neoplasm of other parts of uterus: Secondary | ICD-10-CM

## 2017-12-02 DIAGNOSIS — M8580 Other specified disorders of bone density and structure, unspecified site: Secondary | ICD-10-CM | POA: Diagnosis not present

## 2017-12-02 DIAGNOSIS — I10 Essential (primary) hypertension: Secondary | ICD-10-CM

## 2017-12-02 DIAGNOSIS — Z6831 Body mass index (BMI) 31.0-31.9, adult: Secondary | ICD-10-CM

## 2017-12-02 MED ORDER — MELOXICAM 15 MG PO TABS: 15.0000 mg | ORAL_TABLET | Freq: Every day | ORAL | 0 refills | Status: DC | PRN

## 2017-12-02 MED ORDER — RAMIPRIL 5 MG PO CAPS: 5.0000 mg | ORAL_CAPSULE | ORAL | 3 refills | Status: DC

## 2017-12-02 NOTE — Assessment & Plan Note (Signed)
Continue vit D supplement 1000 mg BID for next year.. Then we will recheck.

## 2017-12-02 NOTE — Progress Notes (Signed)
Subjective:    Patient ID: Melissa [name], female    DOB: 07-07-1955, 62 y.o.   MRN: [id number]  HPI   The patient is here for annual wellness exam and preventative care.    She recently had a cholecystectomy for biliary dyskinesiaia on 11/16/2017.  Hypertension:   good control on  Ramipril 5 mg daily. BP Readings from Last 3 Encounters:  12/02/17 120/80  12/01/17 137/86  11/16/17 126/80  Using medication without problems or lightheadedness: none Chest pain with exertion: none Edema:none Short of breath:none Average home BPs: Other issues:  Elevated Cholesterol:  Excellent control with lifestyle. Lab Results  Component Value Date   CHOL 135 11/25/2017   HDL 37.70 (L) 11/25/2017   LDLCALC 83 11/25/2017   TRIG 71.0 11/25/2017   CHOLHDL 4 11/25/2017  Using medications without problems: Muscle aches:  Diet compliance: moderate Exercise:  Got back to [facility] this AM. Plans 3-4 days a week. Other complaints: Wt Readings from Last 3 Encounters:  12/02/17 185 lb (83.9 kg)  12/01/17 187 lb (84.8 kg)  11/10/17 189 lb (85.7 kg)    Vit D def:on  1000 IU  Daily, occ twice daily.   Hx of endometrial cancer; followed by Dr. [place] in past.. Now released  Blood pressure 120/80, pulse 73, temperature 98.4 F (36.9 C), temperature source Oral, height 5' 5.5" (1.664 m), weight 185 lb (83.9 kg). Social History /Family History/Past Medical History reviewed in detail and updated in EMR if needed.   Review of Systems  Constitutional: Negative for fatigue and fever.  HENT: Negative for congestion.   Eyes: Negative for pain.  Respiratory: Negative for cough and shortness of breath.   Cardiovascular: Negative for chest pain, palpitations and leg swelling.  Gastrointestinal: Negative for abdominal pain.  Genitourinary: Negative for dysuria and vaginal bleeding.  Musculoskeletal: Negative for back pain.  Neurological: Negative for syncope, light-headedness and headaches.    Psychiatric/Behavioral: Negative for dysphoric mood.       Objective:   Physical Exam  Constitutional: Vital signs are normal. She appears well-developed and well-nourished. She is cooperative.  Non-toxic appearance. She does not appear ill. No distress.  HENT:  Head: Normocephalic.  Right Ear: Hearing, tympanic membrane, external ear and ear canal normal.  Left Ear: Hearing, tympanic membrane, external ear and ear canal normal.  Nose: Nose normal.  Eyes: Pupils are equal, round, and reactive to light. Conjunctivae, EOM and lids are normal. Lids are everted and swept, no foreign bodies found.  Neck: Trachea normal and normal range of motion. Neck supple. Carotid bruit is not present. No thyroid mass and no thyromegaly present.  Cardiovascular: Normal rate, regular rhythm, S1 normal, S2 normal, normal heart sounds and intact distal pulses. Exam reveals no gallop.  No murmur heard. Pulmonary/Chest: Effort normal and breath sounds normal. No respiratory distress. She has no wheezes. She has no rhonchi. She has no rales.  Abdominal: Soft. Normal appearance and bowel sounds are normal. She exhibits no distension, no fluid wave, no abdominal bruit and no mass. There is no hepatosplenomegaly. There is no tenderness. There is no rebound, no guarding and no CVA tenderness. No hernia. Hernia confirmed negative in the right inguinal area and confirmed negative in the left inguinal area.  Genitourinary: Rectum normal and vagina normal. There is no rash or tenderness on the right labia. There is no rash or tenderness on the left labia. Right adnexum displays no mass, no tenderness and no fullness. Left adnexum displays no mass, no tenderness  and no fullness.  Lymphadenopathy:    She has no cervical adenopathy.    She has no axillary adenopathy. No inguinal adenopathy noted on the right or left side.  Neurological: She is alert. She has normal strength. No cranial nerve deficit or sensory deficit.  Skin:  Skin is warm, dry and intact. No rash noted.  Psychiatric: Her speech is normal and behavior is normal. Judgment normal. Her mood appears not anxious. Cognition and memory are normal. She does not exhibit a depressed mood.          Assessment & Plan:  The patient's preventative maintenance and recommended screening tests for an annual wellness exam were reviewed in full today. Brought up to date unless services declined.  Counselled on the importance of diet, exercise, and its role in overall health and mortality. The patient's FH and SH was reviewed, including their home life, tobacco status, and drug and alcohol status.   PAP/GYN: hx of endometrial cancer, s/s total abdominal hysterectomy,  7years out now.  Need DVE yearly per pt.. No pap recommended. Vaccines: uptodate with Td, uptodate withshingles and given flu  in 10/2017 Colon: Dr. Fuller Plan. Nml 2013,  Repeat 2018.... recomemend repeat 3 years Mammogram: 5/20193D mammo nml... heterogenously dense breasts.Marland Kitchen  DEXA: mother with osteoporosis, no other risk factors.. 2016 osteopenia but borderline.. Consider repeat in 2 years... Plans with mamo in 2020 Nonsmoker Hep C screen: done HIV: done

## 2017-12-02 NOTE — Assessment & Plan Note (Signed)
Working on increasing HDL with exercise and diet.

## 2017-12-02 NOTE — Assessment & Plan Note (Signed)
Encouraged exercise, weight loss, healthy eating habits. ? ?

## 2017-12-02 NOTE — Assessment & Plan Note (Signed)
Well controlled. Continue current medication.  

## 2017-12-02 NOTE — Addendum Note (Signed)
Addended by: Carter Kitten on: 12/02/2017 02:45 PM   Modules accepted: Orders

## 2017-12-02 NOTE — Patient Instructions (Addendum)
Continue vit D supplement 1000 mg BID for next year.. Then we will recheck.  When scheduling mammogram call to get an order for bone density as well.

## 2017-12-02 NOTE — Assessment & Plan Note (Signed)
Resolved. Nml: DVE today

## 2018-03-02 ENCOUNTER — Other Ambulatory Visit: Payer: Self-pay | Admitting: Family Medicine

## 2018-03-02 DIAGNOSIS — K080 Exfoliation of teeth due to systemic causes: Secondary | ICD-10-CM | POA: Diagnosis not present

## 2018-03-02 NOTE — Telephone Encounter (Signed)
Last office visit 12/02/2017 for CPE.  Last refilled 12/02/2017 for #90 with no refills.  Next Appt: 12/08/2018 for Welcome to Medicare.

## 2018-05-23 ENCOUNTER — Telehealth: Payer: Self-pay | Admitting: Family Medicine

## 2018-05-23 ENCOUNTER — Other Ambulatory Visit: Payer: Self-pay | Admitting: Family Medicine

## 2018-05-23 DIAGNOSIS — M8580 Other specified disorders of bone density and structure, unspecified site: Secondary | ICD-10-CM

## 2018-05-23 DIAGNOSIS — Z1231 Encounter for screening mammogram for malignant neoplasm of breast: Secondary | ICD-10-CM

## 2018-05-23 NOTE — Telephone Encounter (Signed)
Pt need referral for a bone density at [facility] in [place]. Please advise pt.

## 2018-07-19 ENCOUNTER — Ambulatory Visit
Admission: RE | Admit: 2018-07-19 | Discharge: 2018-07-19 | Disposition: A | Discharge status: Home / Self Care | Payer: Federal, State, Local not specified - PPO | Source: Ambulatory Visit | Attending: Family Medicine | Admitting: Family Medicine

## 2018-07-19 ENCOUNTER — Other Ambulatory Visit: Payer: Self-pay

## 2018-07-19 DIAGNOSIS — Z1231 Encounter for screening mammogram for malignant neoplasm of breast: Secondary | ICD-10-CM | POA: Diagnosis not present

## 2018-08-31 ENCOUNTER — Other Ambulatory Visit: Payer: Self-pay

## 2018-08-31 ENCOUNTER — Ambulatory Visit
Admission: RE | Admit: 2018-08-31 | Discharge: 2018-08-31 | Disposition: A | Discharge status: Home / Self Care | Payer: Federal, State, Local not specified - PPO | Source: Ambulatory Visit | Attending: Family Medicine | Admitting: Family Medicine

## 2018-08-31 DIAGNOSIS — M8588 Other specified disorders of bone density and structure, other site: Secondary | ICD-10-CM | POA: Diagnosis not present

## 2018-08-31 DIAGNOSIS — Z780 Asymptomatic menopausal state: Secondary | ICD-10-CM | POA: Diagnosis not present

## 2018-08-31 DIAGNOSIS — M810 Age-related osteoporosis without current pathological fracture: Secondary | ICD-10-CM | POA: Diagnosis not present

## 2018-08-31 DIAGNOSIS — M8580 Other specified disorders of bone density and structure, unspecified site: Secondary | ICD-10-CM

## 2018-09-01 ENCOUNTER — Encounter: Payer: Self-pay | Admitting: Family Medicine

## 2018-09-05 ENCOUNTER — Encounter: Payer: Self-pay | Admitting: Family Medicine

## 2018-09-05 ENCOUNTER — Ambulatory Visit (INDEPENDENT_AMBULATORY_CARE_PROVIDER_SITE_OTHER): Payer: Federal, State, Local not specified - PPO | Admitting: Family Medicine

## 2018-09-05 DIAGNOSIS — M816 Localized osteoporosis [Lequesne]: Secondary | ICD-10-CM | POA: Diagnosis not present

## 2018-09-05 MED ORDER — ALENDRONATE SODIUM 70 MG PO TABS: 70.0000 mg | ORAL_TABLET | ORAL | 11 refills | Status: DC

## 2018-09-05 NOTE — Patient Instructions (Addendum)
MAke sure taking: at least 800 international units of vitamin D daily If inadequate Ca in diet: supplemental elemental calcium (generally 500 to 1000 mg/day), in divided doses at mealtime  Make sure doing weight bearing exercsie 3-5 times a week. Start fosamax weekly.  Repeat DEXA in 2 years.  Osteoporosis  Osteoporosis is thinning and loss of density in your bones. Osteoporosis makes bones more brittle and fragile and more likely to break (fracture). Over time, osteoporosis can cause your bones to become so weak that they fracture after a minor fall. Bones in the hip, wrist, and spine are most likely to fracture due to osteoporosis. What are the causes? The exact cause of this condition is not known. What increases the risk? You may be at greater risk for osteoporosis if you:  Have a family history of the condition.  Have poor nutrition.  Use steroid medicines, such as prednisone.  Are female.  Are age 62 or older.  Smoke or have a history of smoking.  Are not physically active (are sedentary).  Are white (Caucasian) or of Asian descent.  Have a small body frame.  Take certain medicines, such as antiseizure medicines. What are the signs or symptoms? A fracture might be the first sign of osteoporosis, especially if the fracture results from a fall or injury that usually would not cause a bone to break. Other signs and symptoms include:  Pain in the neck or low back.  Stooped posture.  Loss of height. How is this diagnosed? This condition may be diagnosed based on:  Your medical history.  A physical exam.  A bone mineral density test, also called a DXA or DEXA test (dual-energy X-ray absorptiometry test). This test uses X-rays to measure the amount of minerals in your bones. How is this treated? The goal of treatment is to strengthen your bones and lower your risk for a fracture. Treatment may involve:  Making lifestyle changes, such as: ? Including foods with  more calcium and vitamin D in your diet. ? Doing weight-bearing and muscle-strengthening exercises. ? Stopping tobacco use. ? Limiting alcohol intake.  Taking medicine to slow the process of bone loss or to increase bone density.  Taking daily supplements of calcium and vitamin D.  Taking hormone replacement medicines, such as estrogen for women and testosterone for men.  Monitoring your levels of calcium and vitamin D. Follow these instructions at home:  Activity  Exercise as told by your health care provider. Ask your health care provider what exercises and activities are safe for you. You should do: ? Exercises that make you work against gravity (weight-bearing exercises), such as tai chi, yoga, or walking. ? Exercises to strengthen muscles, such as lifting weights. Lifestyle  Limit alcohol intake to no more than 1 drink a day for nonpregnant women and 2 drinks a day for men. One drink equals 12 oz of beer, 5 oz of wine, or 1 oz of hard liquor.  Do not use any products that contain nicotine or tobacco, such as cigarettes and e-cigarettes. If you need help quitting, ask your health care provider. Preventing falls  Use devices to help you move around (mobility aids) as needed, such as canes, walkers, scooters, or crutches.  Keep rooms well-lit and clutter-free.  Remove tripping hazards from walkways, including cords and throw rugs.  Install grab bars in bathrooms and safety rails on stairs.  Use rubber mats in the bathroom and other areas that are often wet or slippery.  Wear closed-toe shoes  that fit well and support your feet. Wear shoes that have rubber soles or low heels.  Review your medicines with your health care provider. Some medicines can cause dizziness or changes in blood pressure, which can increase your risk of falling. General instructions  Include calcium and vitamin D in your diet. Calcium is important for bone health, and vitamin D helps your body to  absorb calcium. Good sources of calcium and vitamin D include: ? Certain fatty fish, such as salmon and tuna. ? Products that have calcium and vitamin D added to them (fortified products), such as fortified cereals. ? Egg yolks. ? Cheese. ? Liver.  Take over-the-counter and prescription medicines only as told by your health care provider.  Keep all follow-up visits as told by your health care provider. This is important. Contact a health care provider if:  You have never been screened for osteoporosis and you are: ? A woman who is age 32 or older. ? A man who is age 91 or older. Get help right away if:  You fall or injure yourself. Summary  Osteoporosis is thinning and loss of density in your bones. This makes bones more brittle and fragile and more likely to break (fracture),even with minor falls.  The goal of treatment is to strengthen your bones and reduce your risk for a fracture.  Include calcium and vitamin D in your diet. Calcium is important for bone health, and vitamin D helps your body to absorb calcium.  Talk with your health care provider about screening for osteoporosis if you are a woman who is age 76 or older, or a man who is age 67 or older. This information is not intended to replace advice given to you by your health care provider. Make sure you discuss any questions you have with your health care provider. Document Released: 11/18/2004 Document Revised: 01/21/2017 Document Reviewed: 12/03/2016 Elsevier Patient Education  2020 [employer].

## 2018-09-05 NOTE — Progress Notes (Signed)
VIRTUAL VISIT Due to national recommendations of social distancing due to [place] 19, a virtual visit is felt to be most appropriate for this patient at this time.   I connected with the patient on 09/05/18 at 10:40 AM EDT by virtual telehealth platform and verified that I am speaking with the correct person using two identifiers.   I discussed the limitations, risks, security and privacy concerns of performing an evaluation and management service by  virtual telehealth platform and the availability of in person appointments. I also discussed with the patient that there may be a patient responsible charge related to this service. The patient expressed understanding and agreed to proceed.  Patient location: Home Provider Location: [place] Participants: Arlyce Circle Diona Browner and [name]  CC: Discuss bone density: new diagnosis osteoporosis in hip  History of Present Illness: 63 year old female present with new diagnosis osteoporosis in hip.  Lowest T score from 08/2018 DEXA: -2.6 in hip 2016 lowest T -2.3   No fractures  nonsmoker, postmenopausal, family history of osteoporosis (mother)  Last GFR 97  Walking 2-3 times a week.  She reports  She is taking vit 2000 mg daily vit D3   COVID 19 screen No recent travel or known exposure to [place] The patient denies respiratory symptoms of COVID 19 at this time.  The importance of social distancing was discussed today.   Review of Systems  Constitutional: Negative for chills and fever.  HENT: Negative for congestion and ear pain.   Eyes: Negative for pain and redness.  Respiratory: Negative for cough and shortness of breath.   Cardiovascular: Negative for chest pain, palpitations and leg swelling.  Gastrointestinal: Negative for abdominal pain, blood in stool, constipation, diarrhea, nausea and vomiting.  Genitourinary: Negative for dysuria.  Musculoskeletal: Negative for falls and myalgias.  Skin: Negative for rash.   Neurological: Negative for dizziness.  Psychiatric/Behavioral: Negative for depression. The patient is not nervous/anxious.       Past Medical History:  Diagnosis Date  . Cancer of uterine body ([place]) dx'd 04/14/10   radical hysterectomy, chemotherapy and radiation therapy  . Chronic cholecystitis   . Hypertension   . Osteoarthritis    HX OF SOME  OSTEOARTHRITIS    reports that she has never smoked. She has never used smokeless tobacco. She reports current alcohol use. She reports that she does not use drugs.   Current Outpatient Medications:  .  calcium carbonate (TUMS - DOSED IN MG ELEMENTAL CALCIUM) 500 MG chewable tablet, Chew 1 tablet by mouth 2 (two) times daily. , Disp: , Rfl:  .  cholecalciferol (VITAMIN D) 1000 units tablet, Take 1,000 Units by mouth daily., Disp: , Rfl:  .  glucosamine-chondroitin 500-400 MG tablet, Take 2 tablets by mouth daily., Disp: , Rfl:  .  meloxicam (MOBIC) 15 MG tablet, TAKE 1 TABLET BY MOUTH EVERY DAY AS NEEDED FOR PAIN, Disp: 90 tablet, Rfl: 0 .  ramipril (ALTACE) 5 MG capsule, Take 1 capsule (5 mg total) by mouth every morning., Disp: 90 capsule, Rfl: 3   Observations/Objective: There were no vitals taken for this visit.  Physical Exam  Physical Exam Constitutional:      General: The patient is not in acute distress. Pulmonary:     Effort: Pulmonary effort is normal. No respiratory distress.  Neurological:     Mental Status: The patient is alert and oriented to person, place, and time.  Psychiatric:        Mood and Affect: Mood normal.  Behavior: Behavior normal.   Assessment and Plan Osteoporosis Recommend weight bearing exercise, calcium in diet and vit D supplement 400 IU 1-2 times daily.  Started on fosmax .. reviewed how to correctly take med, SE and course. Repeat DEXA in 2 years. ]    I discussed the assessment and treatment plan with the patient. The patient was provided an opportunity to ask questions and all were  answered. The patient agreed with the plan and demonstrated an understanding of the instructions.   The patient was advised to call back or seek an in-person evaluation if the symptoms worsen or if the condition fails to improve as anticipated.     Eliezer Lofts, MD

## 2018-09-05 NOTE — Assessment & Plan Note (Signed)
Recommend weight bearing exercise, calcium in diet and vit D supplement 400 IU 1-2 times daily.  Started on fosmax .. reviewed how to correctly take med, SE and course. Repeat DEXA in 2 years.

## 2018-10-19 ENCOUNTER — Ambulatory Visit (INDEPENDENT_AMBULATORY_CARE_PROVIDER_SITE_OTHER): Payer: Federal, State, Local not specified - PPO

## 2018-10-19 DIAGNOSIS — Z23 Encounter for immunization: Secondary | ICD-10-CM | POA: Diagnosis not present

## 2018-11-24 ENCOUNTER — Telehealth: Payer: Self-pay | Admitting: Family Medicine

## 2018-11-24 DIAGNOSIS — M816 Localized osteoporosis [Lequesne]: Secondary | ICD-10-CM

## 2018-11-24 DIAGNOSIS — I10 Essential (primary) hypertension: Secondary | ICD-10-CM

## 2018-11-24 DIAGNOSIS — E786 Lipoprotein deficiency: Secondary | ICD-10-CM

## 2018-11-24 NOTE — Telephone Encounter (Signed)
-----   Message from Ellamae Sia sent at 11/22/2018  2:56 PM EDT ----- Regarding: Lab orders for Thursday, 10.8.20 Patient is scheduled for CPX labs, please order future labs, Thanks , Karna Christmas

## 2018-11-26 ENCOUNTER — Other Ambulatory Visit: Payer: Self-pay | Admitting: Family Medicine

## 2018-11-27 IMAGING — US US ABDOMEN LIMITED
1 series · 14 of 25 positions shown · non-contrast
Comparison: CT 11/24/2015.

CLINICAL DATA: Right upper quadrant pain.

EXAM:
ULTRASOUND ABDOMEN LIMITED RIGHT UPPER QUADRANT

[Series 1: us abdomen limited · 0.16mm/px · 14 of 39 slices shown]
[im 1/39]
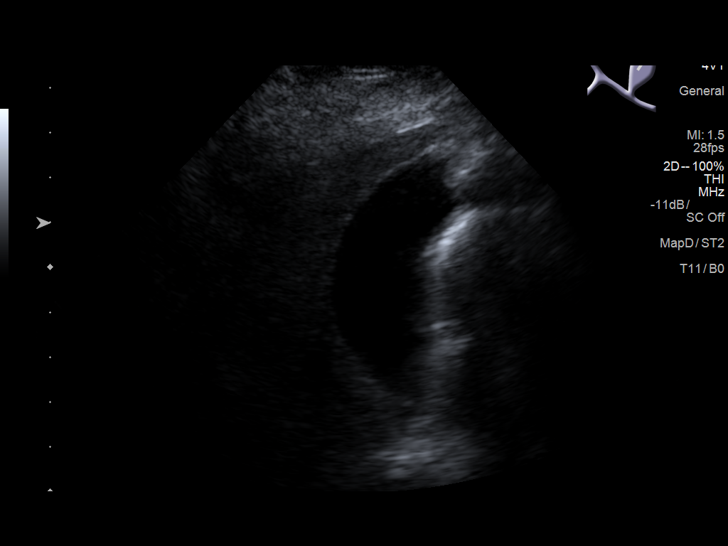
[im 4/39]
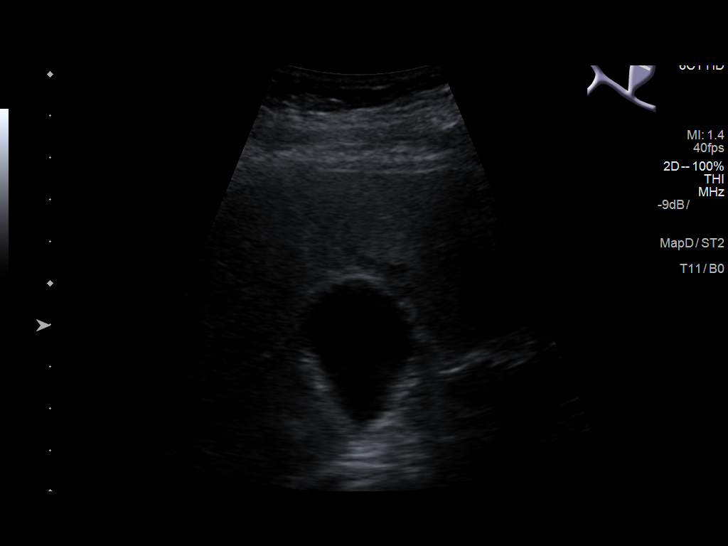
[im 7/39]
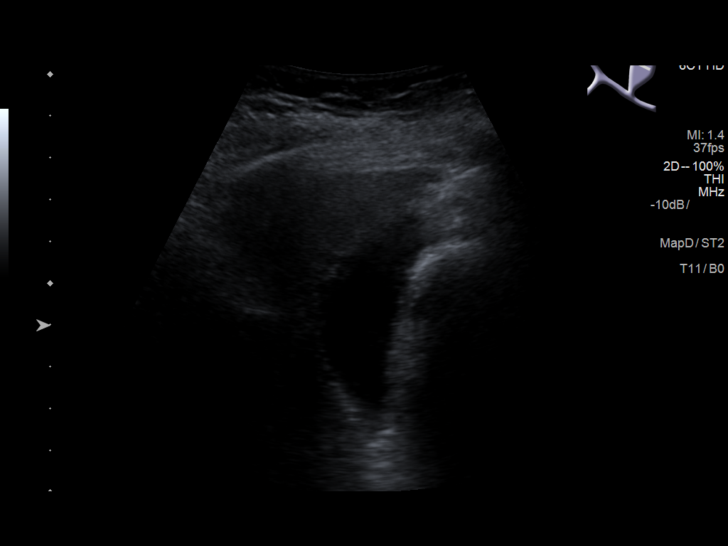
[im 10/39]
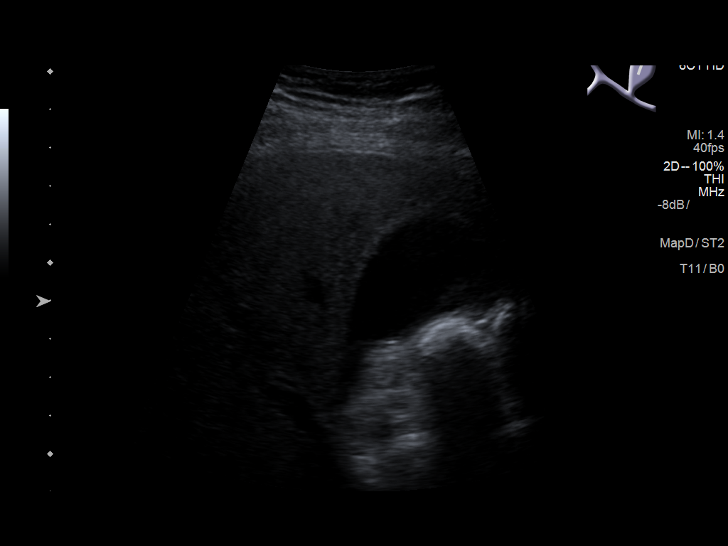
[im 13/39]
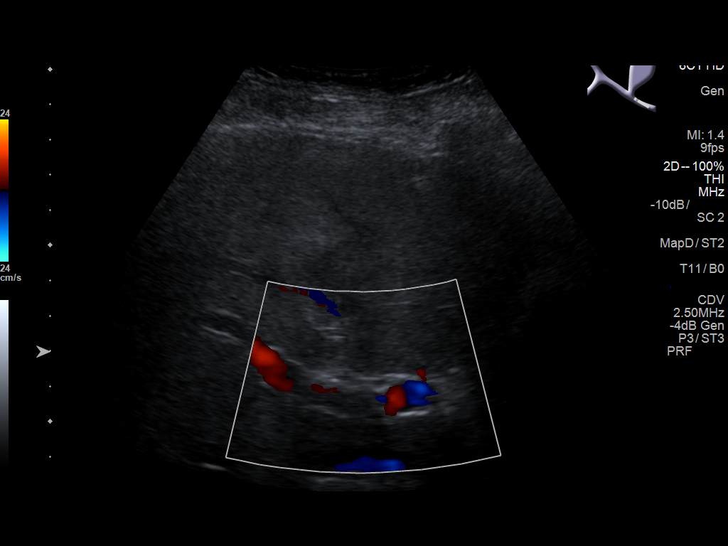
[im 15/39]
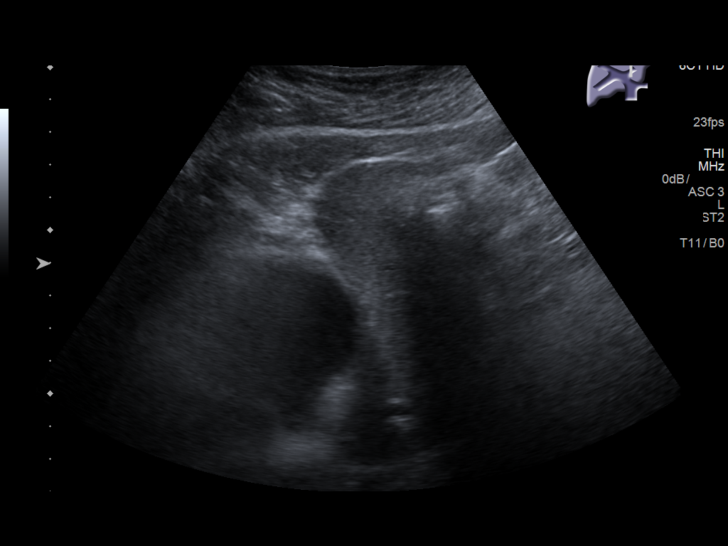
[im 18/39]
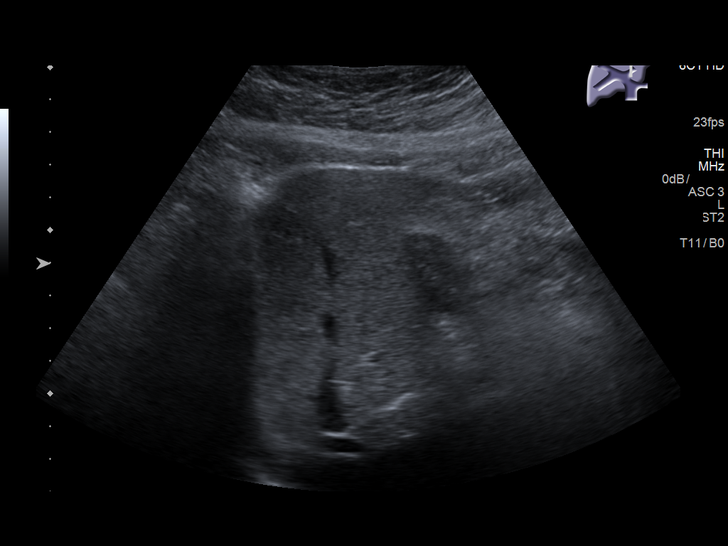
[im 21/39]
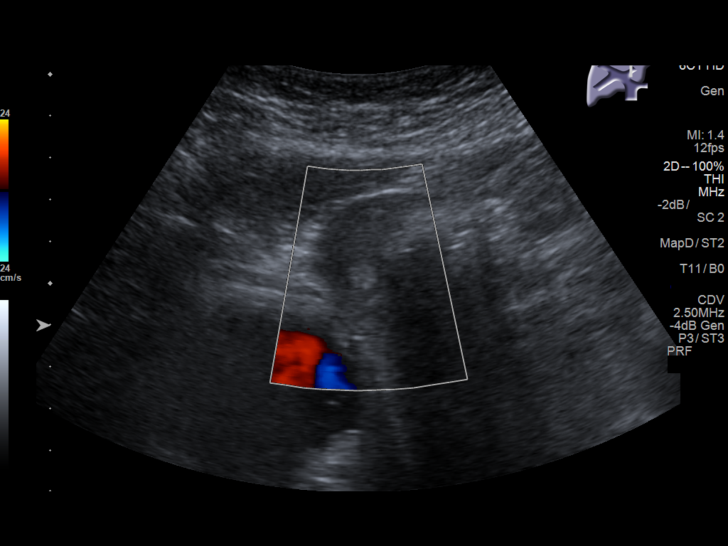
[im 24/39]
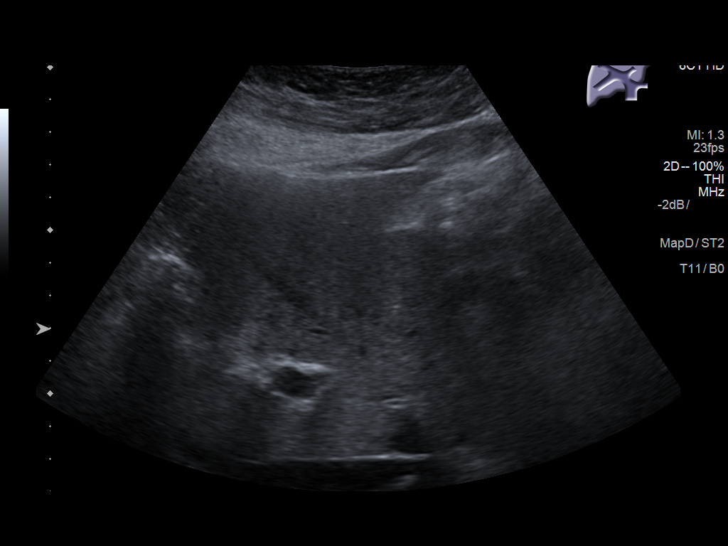
[im 26/39]
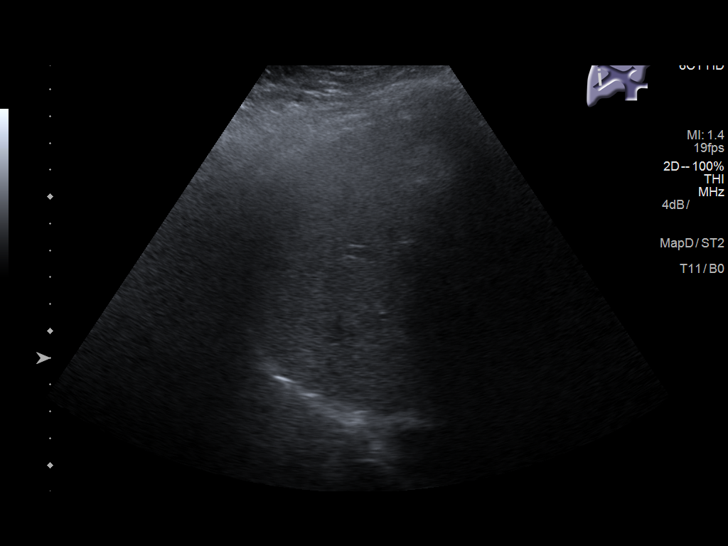
[im 29/39]
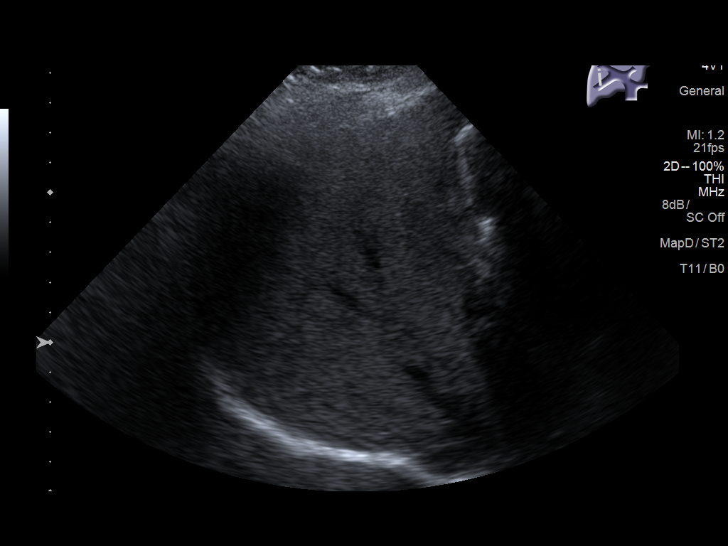
[im 32/39]
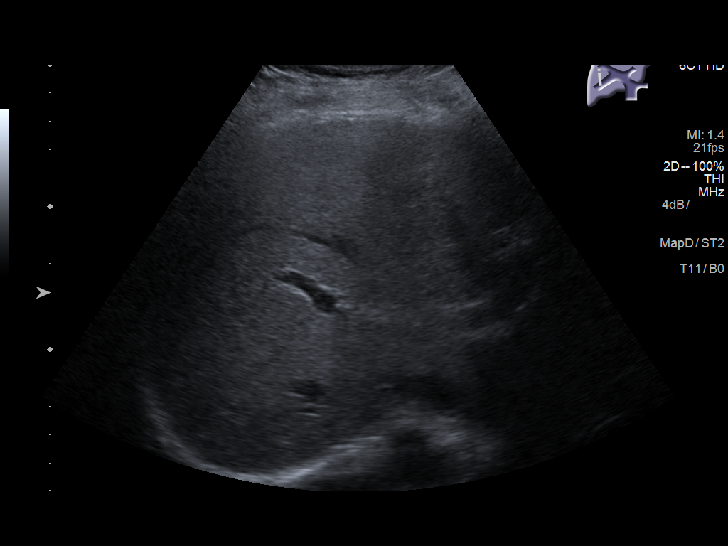
[im 35/39]
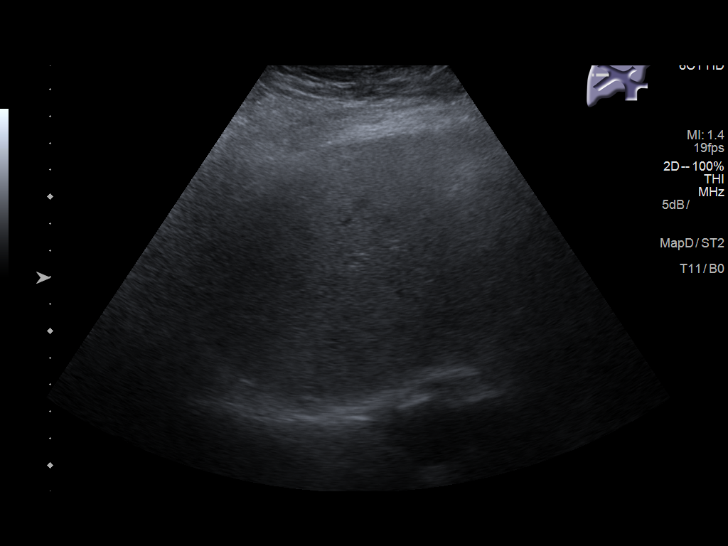
[im 39/39]
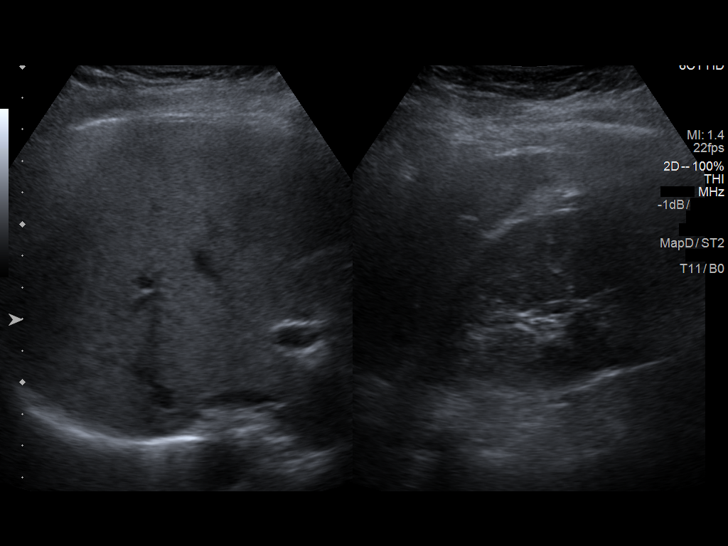

[14 of 25 positions shown; findings below may reference images not displayed]

FINDINGS: Gallbladder:

No gallstones or wall thickening visualized. No sonographic Murphy
sign noted by sonographer.

Common bile duct:

Diameter: 2 mm

Liver:

8 mm hyperechoic focus noted in the left hepatic lobe most
consistent with a benign hemangioma. Previously identified focal
hepatic lesions consistent with hemangiomas not identified by days
ultrasound. Portal vein is patent on color Doppler imaging with
normal direction of blood flow towards the liver.
IMPRESSION: 1.  No gallstones or biliary distention.

2. 8 mm hyperechoic focus in the left hepatic lobe most likely a
tiny hemangioma. Previously identified hemangiomas noted on prior CT
and MRI not identified by today's ultrasound.

## 2018-11-30 ENCOUNTER — Other Ambulatory Visit (INDEPENDENT_AMBULATORY_CARE_PROVIDER_SITE_OTHER): Payer: Medicare Other

## 2018-11-30 ENCOUNTER — Other Ambulatory Visit: Payer: Self-pay

## 2018-11-30 DIAGNOSIS — M816 Localized osteoporosis [Lequesne]: Secondary | ICD-10-CM | POA: Diagnosis not present

## 2018-11-30 DIAGNOSIS — I10 Essential (primary) hypertension: Secondary | ICD-10-CM

## 2018-11-30 LAB — LIPID PANEL
Cholesterol: 175 mg/dL (ref 0–200)
HDL: 44.6 mg/dL (ref >39.00)
LDL Cholesterol: 114 mg/dL — ABNORMAL HIGH (ref 0–99)
NonHDL: 130.89
Total CHOL/HDL Ratio: 4
Triglycerides: 83 mg/dL (ref 0.0–149.0)
VLDL: 16.6 mg/dL (ref 0.0–40.0)

## 2018-11-30 LAB — COMPREHENSIVE METABOLIC PANEL
ALT: 21 U/L (ref 0–35)
AST: 20 U/L (ref 0–37)
Albumin: 4.2 g/dL (ref 3.5–5.2)
Alkaline Phosphatase: 63 U/L (ref 39–117)
BUN: 16 mg/dL (ref 6–23)
CO2: 27 mEq/L (ref 19–32)
Calcium: 9.2 mg/dL (ref 8.4–10.5)
Chloride: 105 mEq/L (ref 96–112)
Creatinine, Ser: 0.65 mg/dL (ref 0.40–1.20)
GFR: 91.45 mL/min (ref >60.00)
Glucose, Bld: 84 mg/dL (ref 70–99)
Potassium: 4.1 mEq/L (ref 3.5–5.1)
Sodium: 140 mEq/L (ref 135–145)
Total Bilirubin: 0.5 mg/dL (ref 0.2–1.2)
Total Protein: 7.1 g/dL (ref 6.0–8.3)

## 2018-11-30 LAB — VITAMIN D 25 HYDROXY (VIT D DEFICIENCY, FRACTURES): VITD: 44.52 ng/mL (ref 30.00–100.00)

## 2018-11-30 NOTE — Progress Notes (Signed)
No critical labs need to be addressed urgently. We will discuss labs in detail at upcoming office visit.   

## 2018-12-08 ENCOUNTER — Encounter: Payer: Self-pay | Admitting: Family Medicine

## 2018-12-08 ENCOUNTER — Ambulatory Visit (INDEPENDENT_AMBULATORY_CARE_PROVIDER_SITE_OTHER): Payer: Medicare Other | Admitting: Family Medicine

## 2018-12-08 ENCOUNTER — Other Ambulatory Visit: Payer: Self-pay

## 2018-12-08 VITALS — BP 124/84 | HR 73 | Temp 99.1°F | Ht 65.5 in | Wt 196.5 lb

## 2018-12-08 DIAGNOSIS — E6609 Other obesity due to excess calories: Secondary | ICD-10-CM

## 2018-12-08 DIAGNOSIS — Z0000 Encounter for general adult medical examination without abnormal findings: Secondary | ICD-10-CM

## 2018-12-08 DIAGNOSIS — Z23 Encounter for immunization: Secondary | ICD-10-CM

## 2018-12-08 DIAGNOSIS — I10 Essential (primary) hypertension: Secondary | ICD-10-CM

## 2018-12-08 DIAGNOSIS — M816 Localized osteoporosis [Lequesne]: Secondary | ICD-10-CM

## 2018-12-08 DIAGNOSIS — Z6831 Body mass index (BMI) 31.0-31.9, adult: Secondary | ICD-10-CM

## 2018-12-08 NOTE — Patient Instructions (Signed)
Work on low cholesterol low ice cream diet.  Increase exercise as able.

## 2018-12-08 NOTE — Assessment & Plan Note (Signed)
AHA 10 year CVD risk calc: 7.4% .. worsened control in last year Work on low chol diet. Info given.

## 2018-12-08 NOTE — Progress Notes (Signed)
Chief Complaint  Patient presents with  . Welcome To Medicare    History of Present Illness: HPI  The patient presents for  WELCOME to medicare wellness, complete physical and review of chronic health problems. He/She also has the following acute concerns today: none  I have personally reviewed the Medicare Annual Wellness questionnaire and have noted 1. The patient's medical and social history 2. Their use of alcohol, tobacco or illicit drugs 3. Their current medications and supplements 4. The patient's functional ability including ADL's, fall risks, home safety risks and hearing or visual             impairment. 5. Diet and physical activities 6. Evidence for depression or mood disorders 7.         Updated provider list Cognitive evaluation was performed and recorded on pt medicare questionnaire form. The patients weight, height, BMI and visual acuity have been recorded in the chart  I have made referrals, counseling and provided education to the patient based review of the above and I have provided the pt with a written personalized care plan for preventive services.   Documentation of this information was scanned into the electronic record under the media tab.  Advance directives and end of life planning reviewed in detail with patient and documented in EMR. Patient given handout on advance care directives if needed. HCPOA and living will updated if needed.  Fall Risk  12/08/2018  Falls in the past year? 0    Hearing Screening   Method: Audiometry   125Hz  250Hz  500Hz  1000Hz  2000Hz  3000Hz  4000Hz  6000Hz  8000Hz   Right ear:   20 20 20  20     Left ear:   20 20 20  20       Visual Acuity Screening   Right eye Left eye Both eyes  Without correction:     With correction: 20/20 20/20 20/20       Office Visit from 12/02/2017 in [place] at [facility] Total Score  0      Hypertension:   At goal on ramipril.   BP Readings from Last 3 Encounters:  12/08/18  124/84  12/02/17 120/80  12/01/17 137/86  Using medication without problems or lightheadedness:  none Chest pain with exertion:none Edema:none Short of breath:none Average home BPs: Other issues:  Elevated Cholesterol:  AHA 10 year CVD risk calc: 7.4% .. worsened control in last year Lab Results  Component Value Date   CHOL 175 11/30/2018   HDL 44.60 11/30/2018   LDLCALC 114 (H) 11/30/2018   TRIG 83.0 11/30/2018   CHOLHDL 4 11/30/2018  Using medications without problems: Muscle aches:  Diet compliance: moderate Exercise: 3-4 days a week. Other complaints:   Vit D in nml now on supplements.  Hx of endometrial cancer; followed by Dr. [place]ble [place] in past.   Patient Care Team: Jinny Sanders, MD as PCP - General (Family Medicine) Nancy Marus, MD as Consulting Physician (Gynecologic Oncology) Vickie Epley, MD (General Surgery) Ladene Artist, MD as Consulting Physician (Gastroenterology)   COVID 19 screen No recent travel or known exposure to [place] The patient denies respiratory symptoms of COVID 19 at this time.  The importance of social distancing was discussed today.   Review of Systems  Constitutional: Negative for chills and fever.  HENT: Negative for congestion and ear pain.   Eyes: Negative for pain and redness.  Respiratory: Negative for cough and shortness of breath.   Cardiovascular: Negative for chest pain, palpitations and leg swelling.  Gastrointestinal: Negative for abdominal pain, blood in stool, constipation, diarrhea, nausea and vomiting.  Genitourinary: Negative for dysuria.  Musculoskeletal: Negative for falls and myalgias.  Skin: Negative for rash.  Neurological: Negative for dizziness.  Psychiatric/Behavioral: Negative for depression. The patient is not nervous/anxious.       Past Medical History:  Diagnosis Date  . Cancer of uterine body ([place]) dx'd 04/14/10   radical hysterectomy, chemotherapy and radiation therapy  . Chronic cholecystitis    . Hypertension   . Osteoarthritis    HX OF SOME  OSTEOARTHRITIS    reports that she has never smoked. She has never used smokeless tobacco. She reports current alcohol use. She reports that she does not use drugs.   Current Outpatient Medications:  .  alendronate (FOSAMAX) 70 MG tablet, Take 1 tablet (70 mg total) by mouth every 7 (seven) days. Take with a full glass of water on an empty stomach., Disp: 4 tablet, Rfl: 11 .  calcium carbonate (TUMS - DOSED IN MG ELEMENTAL CALCIUM) 500 MG chewable tablet, Chew 1 tablet by mouth 2 (two) times daily. , Disp: , Rfl:  .  cholecalciferol (VITAMIN D) 1000 units tablet, Take 1,000 Units by mouth daily., Disp: , Rfl:  .  meloxicam (MOBIC) 15 MG tablet, TAKE 1 TABLET BY MOUTH EVERY DAY AS NEEDED FOR PAIN, Disp: 90 tablet, Rfl: 0 .  ramipril (ALTACE) 5 MG capsule, TAKE 1 CAPSULE BY MOUTH EVERY MORNING, Disp: 90 capsule, Rfl: 0 .  glucosamine-chondroitin 500-400 MG tablet, Take 2 tablets by mouth daily., Disp: , Rfl:    Observations/Objective: Blood pressure 124/84, pulse 73, temperature 99.1 F (37.3 C), temperature source Temporal, height 5' 5.5" (1.664 m), weight 196 lb 8 oz (89.1 kg), SpO2 98 %.  Physical Exam Exam conducted with a chaperone present.  Constitutional:      General: She is not in acute distress.    Appearance: Normal appearance. She is well-developed. She is not ill-appearing or toxic-appearing.  HENT:     Head: Normocephalic.     Right Ear: Hearing, tympanic membrane, ear canal and external ear normal.     Left Ear: Hearing, tympanic membrane, ear canal and external ear normal.     Nose: Nose normal.  Eyes:     General: Lids are normal. Lids are everted, no foreign bodies appreciated.     Conjunctiva/sclera: Conjunctivae normal.     Pupils: Pupils are equal, round, and reactive to light.  Neck:     Musculoskeletal: Normal range of motion and neck supple.     Thyroid: No thyroid mass or thyromegaly.     Vascular: No  carotid bruit.     Trachea: Trachea normal.  Cardiovascular:     Rate and Rhythm: Normal rate and regular rhythm.     Heart sounds: Normal heart sounds, S1 normal and S2 normal. No murmur. No gallop.   Pulmonary:     Effort: Pulmonary effort is normal. No respiratory distress.     Breath sounds: Normal breath sounds. No wheezing, rhonchi or rales.  Abdominal:     General: Bowel sounds are normal. There is no distension or abdominal bruit.     Palpations: Abdomen is soft. There is no fluid wave or mass.     Tenderness: There is no abdominal tenderness. There is no guarding or rebound.     Hernia: No hernia is present. There is no hernia in the left inguinal area or right inguinal area.  Genitourinary:    Exam position: Supine.  Pubic Area: No rash.      Labia:        Right: No rash, tenderness or lesion.        Left: No rash, tenderness or lesion.      Vagina: Normal. No tenderness.     Uterus: Absent.      Adnexa:        Right: No mass, tenderness or fullness.         Left: No mass, tenderness or fullness.       Rectum: Normal.  Lymphadenopathy:     Cervical: No cervical adenopathy.     Lower Body: No right inguinal adenopathy. No left inguinal adenopathy.  Skin:    General: Skin is warm and dry.     Findings: No rash.  Neurological:     Mental Status: She is alert.     Cranial Nerves: No cranial nerve deficit.     Sensory: No sensory deficit.  Psychiatric:        Mood and Affect: Mood is not anxious or depressed.        Speech: Speech normal.        Behavior: Behavior normal. Behavior is cooperative.        Judgment: Judgment normal.     Hemangioma on buttock right  Assessment and Plan The patient's preventative maintenance and recommended screening tests for an annual wellness exam were reviewed in full today. Brought up to date unless services declined.  Counselled on the importance of diet, exercise, and its role in overall health and mortality. The patient's FH  and SH was reviewed, including their home life, tobacco status, and drug and alcohol status.   PAP/GYN: hx of endometrial cancer, s/s total abdominal hysterectomy.  Need DVE yearly per pt.. No pap recommended. No vaginal issues, no lower abdominal pain Vaccines: due for Td, uptodate withshinglesand flu .. due for prevnar Colon: Dr. Fuller Plan. Nml 2013,  Repeat 2018.... recomemend repeat 3 years Mammogram: 06/2018 3D mammo nml... heterogenously dense breasts.Marland Kitchen  DEXA: mother with osteoporosis, no other risk factors.. 2016 osteopenia  2020 osteoporosis started on fosamax..  Nonsmoker Hep C screen:done [id number]   Osteoporosis Answered questions about fosamax.  Essential hypertension, benign Well controlled. Continue current medication.   Obesity with serious comorbidity Encouraged exercise, weight loss, healthy eating habits.   High cholesterol AHA 10 year CVD risk calc: 7.4% .. worsened control in last year Work on low chol diet. Info given.       Eliezer Lofts, MD

## 2018-12-08 NOTE — Assessment & Plan Note (Signed)
Answered questions about fosamax.

## 2018-12-08 NOTE — Assessment & Plan Note (Signed)
Encouraged exercise, weight loss, healthy eating habits. ? ?

## 2018-12-08 NOTE — Assessment & Plan Note (Signed)
Well controlled. Continue current medication.  

## 2019-02-05 ENCOUNTER — Other Ambulatory Visit: Payer: Self-pay

## 2019-02-05 DIAGNOSIS — Z20822 Contact with and (suspected) exposure to covid-19: Secondary | ICD-10-CM

## 2019-02-07 LAB — NOVEL CORONAVIRUS, NAA: SARS-CoV-2, NAA: NOT DETECTED

## 2019-02-13 ENCOUNTER — Other Ambulatory Visit: Payer: Self-pay | Admitting: Family Medicine

## 2019-02-13 NOTE — Telephone Encounter (Signed)
Last office visit 12/18/2018 for Welcome to Medicare.  Last refilled 03/02/2018 for #90 with no refills.  No future appointments.

## 2019-02-17 ENCOUNTER — Other Ambulatory Visit: Payer: Self-pay | Admitting: Family Medicine

## 2019-03-08 DIAGNOSIS — R69 Illness, unspecified: Secondary | ICD-10-CM | POA: Diagnosis not present

## 2019-03-12 ENCOUNTER — Telehealth: Payer: Self-pay

## 2019-03-12 NOTE — Telephone Encounter (Signed)
Patient contacted the office stating that she was scheduled to receive her to [place]. Patient states that she had to fill out a paper with her health history and she had marked that she had a history of cancer. She then got an e-mail later that day stating that she had been disqualified for the COVID vaccine. She states she did e-mail the clinic back asking if her history of cancer is the reason why she can no longer recieve this - and she is wondering if Dr. Diona Browner knows anything about patient's with a history of cancer and the COVID vaccine?

## 2019-03-14 NOTE — Telephone Encounter (Signed)
Mrs. [place] notified as instructed by telephone.  She said a funny thing happened after she contacted [place].  She got a reminder call stating her and husband were scheduled today for their Covid vaccine.  She [place] and both received [employer] vaccine today.  Immunization record updated.  FYI to Dr. Diona Browner.

## 2019-03-14 NOTE — Telephone Encounter (Signed)
I am not aware of any contraindications in people with previous cancer history.

## 2019-05-12 ENCOUNTER — Other Ambulatory Visit: Payer: Self-pay | Admitting: Family Medicine

## 2019-05-14 NOTE — Telephone Encounter (Signed)
Last office visit 12/08/2018 for Welcome to Medicare.  Last refilled 02/14/2019 for #90 with no refills.  No future appointments.

## 2019-05-17 ENCOUNTER — Observation Stay: Payer: Medicare HMO | Admitting: Anesthesiology

## 2019-05-17 ENCOUNTER — Other Ambulatory Visit: Payer: Self-pay

## 2019-05-17 ENCOUNTER — Emergency Department: Payer: Medicare HMO

## 2019-05-17 ENCOUNTER — Encounter
Admission: EM | Disposition: A | Discharge status: Home / Self Care | Payer: Self-pay | Source: Home / Self Care | Attending: Emergency Medicine

## 2019-05-17 ENCOUNTER — Observation Stay
Admission: EM | Admit: 2019-05-17 | Discharge: 2019-05-18 | Disposition: A | Discharge status: Home / Self Care | Payer: Medicare HMO | Attending: General Surgery | Admitting: General Surgery

## 2019-05-17 ENCOUNTER — Encounter: Payer: Self-pay | Admitting: *Deleted

## 2019-05-17 DIAGNOSIS — C181 Malignant neoplasm of appendix: Secondary | ICD-10-CM | POA: Diagnosis not present

## 2019-05-17 DIAGNOSIS — K3580 Unspecified acute appendicitis: Secondary | ICD-10-CM | POA: Diagnosis not present

## 2019-05-17 DIAGNOSIS — Z791 Long term (current) use of non-steroidal anti-inflammatories (NSAID): Secondary | ICD-10-CM | POA: Insufficient documentation

## 2019-05-17 DIAGNOSIS — R1031 Right lower quadrant pain: Secondary | ICD-10-CM | POA: Diagnosis not present

## 2019-05-17 DIAGNOSIS — M1990 Unspecified osteoarthritis, unspecified site: Secondary | ICD-10-CM | POA: Diagnosis not present

## 2019-05-17 DIAGNOSIS — K353 Acute appendicitis with localized peritonitis, without perforation or gangrene: Secondary | ICD-10-CM | POA: Diagnosis not present

## 2019-05-17 DIAGNOSIS — I10 Essential (primary) hypertension: Secondary | ICD-10-CM | POA: Diagnosis not present

## 2019-05-17 DIAGNOSIS — Z800 Family history of malignant neoplasm of digestive organs: Secondary | ICD-10-CM | POA: Insufficient documentation

## 2019-05-17 DIAGNOSIS — K3589 Other acute appendicitis without perforation or gangrene: Secondary | ICD-10-CM | POA: Diagnosis not present

## 2019-05-17 DIAGNOSIS — Z79899 Other long term (current) drug therapy: Secondary | ICD-10-CM | POA: Diagnosis not present

## 2019-05-17 DIAGNOSIS — Z90710 Acquired absence of both cervix and uterus: Secondary | ICD-10-CM | POA: Insufficient documentation

## 2019-05-17 DIAGNOSIS — Z8542 Personal history of malignant neoplasm of other parts of uterus: Secondary | ICD-10-CM | POA: Insufficient documentation

## 2019-05-17 DIAGNOSIS — Z9049 Acquired absence of other specified parts of digestive tract: Secondary | ICD-10-CM | POA: Insufficient documentation

## 2019-05-17 DIAGNOSIS — Z03818 Encounter for observation for suspected exposure to other biological agents ruled out: Secondary | ICD-10-CM | POA: Diagnosis not present

## 2019-05-17 DIAGNOSIS — K37 Unspecified appendicitis: Secondary | ICD-10-CM | POA: Diagnosis not present

## 2019-05-17 DIAGNOSIS — Z20822 Contact with and (suspected) exposure to covid-19: Secondary | ICD-10-CM | POA: Insufficient documentation

## 2019-05-17 DIAGNOSIS — Z833 Family history of diabetes mellitus: Secondary | ICD-10-CM | POA: Diagnosis not present

## 2019-05-17 DIAGNOSIS — R188 Other ascites: Secondary | ICD-10-CM | POA: Insufficient documentation

## 2019-05-17 DIAGNOSIS — R9341 Abnormal radiologic findings on diagnostic imaging of renal pelvis, ureter, or bladder: Secondary | ICD-10-CM | POA: Diagnosis not present

## 2019-05-17 HISTORY — PX: LAPAROSCOPIC APPENDECTOMY: SHX408

## 2019-05-17 LAB — COMPREHENSIVE METABOLIC PANEL
ALT: 19 U/L (ref 0–44)
AST: 19 U/L (ref 15–41)
Albumin: 4.3 g/dL (ref 3.5–5.0)
Alkaline Phosphatase: 55 U/L (ref 38–126)
Anion gap: 12 (ref 5–15)
BUN: 19 mg/dL (ref 8–23)
CO2: 23 mmol/L (ref 22–32)
Calcium: 9.1 mg/dL (ref 8.9–10.3)
Chloride: 103 mmol/L (ref 98–111)
Creatinine, Ser: 0.59 mg/dL (ref 0.44–1.00)
GFR calc Af Amer: >60 mL/min (ref >60)
GFR calc non Af Amer: >60 mL/min (ref >60)
Glucose, Bld: 144 mg/dL — ABNORMAL HIGH (ref 70–99)
Potassium: 4 mmol/L (ref 3.5–5.1)
Sodium: 138 mmol/L (ref 135–145)
Total Bilirubin: 0.9 mg/dL (ref 0.3–1.2)
Total Protein: 7.7 g/dL (ref 6.5–8.1)

## 2019-05-17 LAB — CBC
HCT: 45.5 % (ref 36.0–46.0)
Hemoglobin: 15 g/dL (ref 12.0–15.0)
MCH: 28.9 pg (ref 26.0–34.0)
MCHC: 33 g/dL (ref 30.0–36.0)
MCV: 87.7 fL (ref 80.0–100.0)
Platelets: 327 10*3/uL (ref 150–400)
RBC: 5.19 MIL/uL — ABNORMAL HIGH (ref 3.87–5.11)
RDW: 12.1 % (ref 11.5–15.5)
WBC: 11.3 10*3/uL — ABNORMAL HIGH (ref 4.0–10.5)
nRBC: 0 % (ref 0.0–0.2)

## 2019-05-17 LAB — LIPASE, BLOOD: Lipase: 40 U/L (ref 11–51)

## 2019-05-17 LAB — RESPIRATORY PANEL BY RT PCR (FLU A&B, COVID)
Influenza A by PCR: NEGATIVE
Influenza B by PCR: NEGATIVE
SARS Coronavirus 2 by RT PCR: NEGATIVE

## 2019-05-17 SURGERY — APPENDECTOMY, LAPAROSCOPIC
Anesthesia: General

## 2019-05-17 MED ORDER — MORPHINE SULFATE (PF) 4 MG/ML IV SOLN
4.0000 mg | INTRAVENOUS | Status: DC | PRN
  Administered 2019-05-17 – 2019-05-18 (×2): 4 mg via INTRAVENOUS
  Filled 2019-05-17 (×2): qty 1

## 2019-05-17 MED ORDER — PIPERACILLIN-TAZOBACTAM 3.375 G IVPB
3.3750 g | Freq: Three times a day (TID) | INTRAVENOUS | Status: DC
  Administered 2019-05-17 – 2019-05-18 (×2): 3.375 g via INTRAVENOUS
  Filled 2019-05-17 (×2): qty 50

## 2019-05-17 MED ORDER — BUPIVACAINE HCL (PF) 0.5 % IJ SOLN
INTRAMUSCULAR | Status: AC
  Filled 2019-05-17: qty 30

## 2019-05-17 MED ORDER — BUPIVACAINE-EPINEPHRINE 0.5% -1:200000 IJ SOLN
INTRAMUSCULAR | Status: DC | PRN
  Administered 2019-05-17: 25 mL

## 2019-05-17 MED ORDER — ACETAMINOPHEN 650 MG RE SUPP: 650.0000 mg | Freq: Four times a day (QID) | RECTAL | Status: DC | PRN

## 2019-05-17 MED ORDER — ONDANSETRON HCL 4 MG/2ML IJ SOLN: 4.0000 mg | Freq: Once | INTRAMUSCULAR | Status: DC | PRN

## 2019-05-17 MED ORDER — ONDANSETRON HCL 4 MG/2ML IJ SOLN
4.0000 mg | Freq: Four times a day (QID) | INTRAMUSCULAR | Status: DC | PRN
  Administered 2019-05-17 (×2): 4 mg via INTRAVENOUS
  Filled 2019-05-17 (×2): qty 2

## 2019-05-17 MED ORDER — FENTANYL CITRATE (PF) 100 MCG/2ML IJ SOLN
INTRAMUSCULAR | Status: DC | PRN
  Administered 2019-05-17: 25 ug via INTRAVENOUS
  Administered 2019-05-17 (×2): 50 ug via INTRAVENOUS

## 2019-05-17 MED ORDER — ONDANSETRON HCL 4 MG/2ML IJ SOLN
INTRAMUSCULAR | Status: AC
  Filled 2019-05-17: qty 2

## 2019-05-17 MED ORDER — PHENYLEPHRINE HCL (PRESSORS) 10 MG/ML IV SOLN
INTRAVENOUS | Status: DC | PRN
  Administered 2019-05-17 (×2): 100 ug via INTRAVENOUS

## 2019-05-17 MED ORDER — SUGAMMADEX SODIUM 200 MG/2ML IV SOLN
INTRAVENOUS | Status: DC | PRN
  Administered 2019-05-17: 200 mg via INTRAVENOUS

## 2019-05-17 MED ORDER — ONDANSETRON HCL 4 MG/2ML IJ SOLN
4.0000 mg | Freq: Once | INTRAMUSCULAR | Status: AC
  Administered 2019-05-17: 4 mg via INTRAVENOUS
  Filled 2019-05-17: qty 2

## 2019-05-17 MED ORDER — SUCCINYLCHOLINE CHLORIDE 20 MG/ML IJ SOLN
INTRAMUSCULAR | Status: DC | PRN
  Administered 2019-05-17: 100 mg via INTRAVENOUS

## 2019-05-17 MED ORDER — RAMIPRIL 5 MG PO CAPS
5.0000 mg | ORAL_CAPSULE | Freq: Every morning | ORAL | Status: DC
  Administered 2019-05-18: 5 mg via ORAL
  Filled 2019-05-17: qty 1

## 2019-05-17 MED ORDER — PROPOFOL 10 MG/ML IV BOLUS
INTRAVENOUS | Status: DC | PRN
  Administered 2019-05-17: 150 mg via INTRAVENOUS

## 2019-05-17 MED ORDER — FENTANYL CITRATE (PF) 250 MCG/5ML IJ SOLN
INTRAMUSCULAR | Status: AC
  Filled 2019-05-17: qty 5

## 2019-05-17 MED ORDER — HYDROCODONE-ACETAMINOPHEN 5-325 MG PO TABS
1.0000 | ORAL_TABLET | ORAL | Status: DC | PRN
  Administered 2019-05-18: 1 via ORAL
  Filled 2019-05-17: qty 1

## 2019-05-17 MED ORDER — MORPHINE SULFATE (PF) 4 MG/ML IV SOLN
4.0000 mg | Freq: Once | INTRAVENOUS | Status: AC
  Administered 2019-05-17: 4 mg via INTRAVENOUS
  Filled 2019-05-17: qty 1

## 2019-05-17 MED ORDER — ACETAMINOPHEN 325 MG PO TABS: 650.0000 mg | ORAL_TABLET | Freq: Four times a day (QID) | ORAL | Status: DC | PRN

## 2019-05-17 MED ORDER — PROPOFOL 10 MG/ML IV BOLUS
INTRAVENOUS | Status: AC
  Filled 2019-05-17: qty 20

## 2019-05-17 MED ORDER — ONDANSETRON HCL 4 MG/2ML IJ SOLN
INTRAMUSCULAR | Status: DC | PRN
  Administered 2019-05-17: 4 mg via INTRAVENOUS

## 2019-05-17 MED ORDER — FENTANYL CITRATE (PF) 100 MCG/2ML IJ SOLN
25.0000 ug | INTRAMUSCULAR | Status: DC | PRN
  Administered 2019-05-17 (×3): 25 ug via INTRAVENOUS

## 2019-05-17 MED ORDER — DEXAMETHASONE SODIUM PHOSPHATE 10 MG/ML IJ SOLN
INTRAMUSCULAR | Status: DC | PRN
  Administered 2019-05-17: 10 mg via INTRAVENOUS

## 2019-05-17 MED ORDER — GLYCOPYRROLATE 0.2 MG/ML IJ SOLN
INTRAMUSCULAR | Status: AC
  Filled 2019-05-17: qty 1

## 2019-05-17 MED ORDER — ONDANSETRON 4 MG PO TBDP: 4.0000 mg | ORAL_TABLET | Freq: Four times a day (QID) | ORAL | Status: DC | PRN

## 2019-05-17 MED ORDER — DEXAMETHASONE SODIUM PHOSPHATE 10 MG/ML IJ SOLN
INTRAMUSCULAR | Status: AC
  Filled 2019-05-17: qty 1

## 2019-05-17 MED ORDER — ROCURONIUM BROMIDE 100 MG/10ML IV SOLN
INTRAVENOUS | Status: DC | PRN
  Administered 2019-05-17: 40 mg via INTRAVENOUS

## 2019-05-17 MED ORDER — ROCURONIUM BROMIDE 10 MG/ML (PF) SYRINGE
PREFILLED_SYRINGE | INTRAVENOUS | Status: AC
  Filled 2019-05-17: qty 10

## 2019-05-17 MED ORDER — ENOXAPARIN SODIUM 40 MG/0.4ML ~~LOC~~ SOLN
40.0000 mg | SUBCUTANEOUS | Status: DC
  Administered 2019-05-17: 40 mg via SUBCUTANEOUS
  Filled 2019-05-17: qty 0.4

## 2019-05-17 MED ORDER — IOHEXOL 300 MG/ML  SOLN
100.0000 mL | Freq: Once | INTRAMUSCULAR | Status: AC | PRN
  Administered 2019-05-17: 100 mL via INTRAVENOUS

## 2019-05-17 MED ORDER — ALENDRONATE SODIUM 70 MG PO TABS: 70.0000 mg | ORAL_TABLET | ORAL | Status: DC

## 2019-05-17 MED ORDER — GLYCOPYRROLATE 0.2 MG/ML IJ SOLN
INTRAMUSCULAR | Status: DC | PRN
  Administered 2019-05-17: .2 mg via INTRAVENOUS

## 2019-05-17 MED ORDER — EPINEPHRINE PF 1 MG/ML IJ SOLN
INTRAMUSCULAR | Status: AC
  Filled 2019-05-17: qty 1

## 2019-05-17 MED ORDER — FENTANYL CITRATE (PF) 100 MCG/2ML IJ SOLN
INTRAMUSCULAR | Status: AC
  Administered 2019-05-17: 25 ug via INTRAVENOUS
  Filled 2019-05-17: qty 2

## 2019-05-17 MED ORDER — SODIUM CHLORIDE 0.9% FLUSH
3.0000 mL | Freq: Once | INTRAVENOUS | Status: AC
  Administered 2019-05-17: 3 mL via INTRAVENOUS

## 2019-05-17 MED ORDER — SUCCINYLCHOLINE CHLORIDE 200 MG/10ML IV SOSY
PREFILLED_SYRINGE | INTRAVENOUS | Status: AC
  Filled 2019-05-17: qty 10

## 2019-05-17 MED ORDER — SODIUM CHLORIDE 0.9 % IV SOLN
2.0000 g | Freq: Once | INTRAVENOUS | Status: DC
  Administered 2019-05-17: 2 g via INTRAVENOUS
  Filled 2019-05-17: qty 20

## 2019-05-17 MED ORDER — SODIUM CHLORIDE 0.9 % IV SOLN: INTRAVENOUS | Status: DC

## 2019-05-17 SURGICAL SUPPLY — 41 items
APPLIER CLIP LOGIC TI 5 (MISCELLANEOUS) IMPLANT
BLADE SURG SZ11 CARB STEEL (BLADE) ×2 IMPLANT
CANISTER SUCT 1200ML W/VALVE (MISCELLANEOUS) ×2 IMPLANT
CHLORAPREP W/TINT 26 (MISCELLANEOUS) ×2 IMPLANT
COVER WAND RF STERILE (DRAPES) ×2 IMPLANT
CUTTER FLEX LINEAR 45M (STAPLE) ×2 IMPLANT
DERMABOND ADVANCED (GAUZE/BANDAGES/DRESSINGS) ×1
DERMABOND ADVANCED .7 DNX12 (GAUZE/BANDAGES/DRESSINGS) ×1 IMPLANT
ELECT REM PT RETURN 9FT ADLT (ELECTROSURGICAL) ×2
ELECTRODE REM PT RTRN 9FT ADLT (ELECTROSURGICAL) ×1 IMPLANT
GLOVE BIO SURGEON STRL SZ 6.5 (GLOVE) ×2 IMPLANT
GLOVE BIOGEL PI IND STRL 6.5 (GLOVE) ×1 IMPLANT
GLOVE BIOGEL PI INDICATOR 6.5 (GLOVE) ×1
GOWN STRL REUS W/ TWL LRG LVL3 (GOWN DISPOSABLE) ×3 IMPLANT
GOWN STRL REUS W/TWL LRG LVL3 (GOWN DISPOSABLE) ×3
GRASPER SUT TROCAR 14GX15 (MISCELLANEOUS) ×2 IMPLANT
HANDLE YANKAUER SUCT BULB TIP (MISCELLANEOUS) ×2 IMPLANT
IRRIGATION STRYKERFLOW (MISCELLANEOUS) IMPLANT
IRRIGATOR STRYKERFLOW (MISCELLANEOUS) ×2
IV NS 1000ML (IV SOLUTION) ×1
IV NS 1000ML BAXH (IV SOLUTION) ×1 IMPLANT
KIT TURNOVER KIT A (KITS) ×2 IMPLANT
LIGASURE LAP MARYLAND 5MM 37CM (ELECTROSURGICAL) ×2 IMPLANT
NEEDLE HYPO 22GX1.5 SAFETY (NEEDLE) ×2 IMPLANT
NEEDLE VERESS 14GA 120MM (NEEDLE) ×2 IMPLANT
NS IRRIG 500ML POUR BTL (IV SOLUTION) ×2 IMPLANT
PACK LAP CHOLECYSTECTOMY (MISCELLANEOUS) ×2 IMPLANT
POUCH ENDO CATCH 10MM SPEC (MISCELLANEOUS) ×2 IMPLANT
RELOAD 45 VASCULAR/THIN (ENDOMECHANICALS) IMPLANT
RELOAD STAPLE 45 2.5 WHT GRN (ENDOMECHANICALS) IMPLANT
RELOAD STAPLE 45 3.5 BLU ETS (ENDOMECHANICALS) ×1 IMPLANT
RELOAD STAPLE TA45 3.5 REG BLU (ENDOMECHANICALS) ×2 IMPLANT
SCISSORS METZENBAUM CVD 33 (INSTRUMENTS) ×1 IMPLANT
SET TUBE SMOKE EVAC HIGH FLOW (TUBING) ×2 IMPLANT
SLEEVE ENDOPATH XCEL 5M (ENDOMECHANICALS) ×2 IMPLANT
SPONGE GAUZE 2X2 8PLY STRL LF (GAUZE/BANDAGES/DRESSINGS) ×2 IMPLANT
SUT MNCRL AB 4-0 PS2 18 (SUTURE) ×2 IMPLANT
SUT VICRYL PLUS ABS 0 54 (SUTURE) ×2 IMPLANT
TRAY FOLEY MTR SLVR 16FR STAT (SET/KITS/TRAYS/PACK) ×2 IMPLANT
TROCAR XCEL 12X100 BLDLESS (ENDOMECHANICALS) ×2 IMPLANT
TROCAR XCEL NON-BLD 5MMX100MML (ENDOMECHANICALS) ×2 IMPLANT

## 2019-05-17 NOTE — Anesthesia Procedure Notes (Signed)
Procedure Name: Intubation Performed by: Rolla Plate, CRNA Pre-anesthesia Checklist: Patient identified, Patient being monitored, Timeout performed, Emergency Drugs available and Suction available Patient Re-evaluated:Patient Re-evaluated prior to induction Oxygen Delivery Method: Circle system utilized Preoxygenation: Pre-oxygenation with 100% oxygen Induction Type: IV induction and Rapid sequence Laryngoscope Size: Charlyne Petrin and 2 Grade View: Grade II Tube type: Oral Tube size: 7.0 mm Number of attempts: 1 Airway Equipment and Method: Stylet Placement Confirmation: ETT inserted through vocal cords under direct vision,  positive ETCO2 and breath sounds checked- equal and bilateral Secured at: 21 cm Tube secured with: Tape Dental Injury: Teeth and Oropharynx as per pre-operative assessment

## 2019-05-17 NOTE — Op Note (Signed)
Preoperative diagnosis: Acute appendicitis.  Postoperative diagnosis: Acute appendicitis  Procedure: Laparoscopic appendectomy.  Anesthesia: GETA  Surgeon: Dr. Windell Moment, MD  Wound Classification: Contaminated  Indications: Patient is a 64 y.o. female  presented with right lower quadrant pain of 1 day of duration, elevated WBC. Computed tomography scan and physical examination were consistent with acute appendicitis.   Findings: 1. Acutely inflamed appendix 2. No peri-appendiceal abscess or phlegmon 3. Normal anatomy 4. Adequate hemostasis.   Description of procedure: The patient was placed on the operating table in the supine position. General anesthesia was induced. A time-out was completed verifying correct patient, procedure, site, positioning, and implant(s) and/or special equipment prior to beginning this procedure. A Foley catheter and orogastric tubes were placed. The abdomen was prepped and draped in the usual sterile fashion.  An incision was made in a natural skin line above the umbilicus.   The fascia was elevated and the Veress needle inserted. Proper position was confirmed by aspiration and saline meniscus test. The abdomen was insufflated with carbon dioxide to a pressure of 15 mmHg. The patient tolerated insufflation well. A 5-mm optiview trocar was then inserted supraumbilically. The laparoscope was inserted and the abdomen inspected. No injuries from initial trocar placement were noted. Turbid fluid was noted in the right lower quadrant. Under direct visualization, an 12-mm trocar was inserted in the left lower quadrant lateral to the rectus muscle. A 5-mm port was then placed above the symphysis pubis on midline.  Care was taken to avoid injury to the bladder or inferior epigastric vessels. The table was placed in the Trendelenburg position with the right side elevated.  The cecum was gently grasped with an endoscopic graspers and pulled toward (the left upper quadrant).  An atraumatic grasper was then passed through the suprapubic port and omentum was dissected away until the appendix was identified. The appendix was then grasped and elevated. It was noted to be inflamed with a cystic like appearance.  An endoscopic linear cutting stapler was then used to divide and staple the base of the appendix. Mesoappendix divided with LigaSure device.   The appendix was placed in an endoscopic retrieval bag and removed.  The appendiceal stump was then irrigated and hemostasis was assured. Fluid was suctioned and no other pathology was identified.  Secondary trocars were removed under direct vision. No bleeding was noted. The laparoscope was withdrawn and the umbilical trocar removed. The abdomen was allowed to collapse. All trocar sites greater than 5 mm were closed with Vicryl 0. The skin was closed with subcuticular sutures Monocryl 3-0 of and steristrips.  The patient tolerated the procedure well and was taken to the postanesthesia care unit in satisfactory condition.   Specimen: Appendix  Complications: None  Estimated Blood Loss: 5 mL

## 2019-05-17 NOTE — ED Triage Notes (Signed)
Pt is here with right sided abdominal pain which feels like her "right side is on fire".  This began yesterday afternoon and progressed during the night.  No fever or chills.  Pt had some nausea and vomited x2.  No diarrhea.  No pain or burning with urination.

## 2019-05-17 NOTE — Anesthesia Postprocedure Evaluation (Signed)
Anesthesia Post Note  Patient: Melissa [name]  Procedure(s) Performed: APPENDECTOMY LAPAROSCOPIC (N/A )  Patient location during evaluation: PACU Anesthesia Type: General Level of consciousness: awake and alert and oriented Pain management: pain level controlled Vital Signs Assessment: post-procedure vital signs reviewed and stable Respiratory status: spontaneous breathing Cardiovascular status: blood pressure returned to baseline Anesthetic complications: no     Last Vitals:  Vitals:   05/17/19 1847 05/17/19 1907  BP: 103/71 (!) 109/59  Pulse: 63 (!) 56  Resp: 20 15  Temp: (!) 36.4 C 36.7 C  SpO2: 100% 98%    Last Pain:  Vitals:   05/17/19 1907  TempSrc: Oral  PainSc:                  Hassaan Crite

## 2019-05-17 NOTE — Transfer of Care (Signed)
Immediate Anesthesia Transfer of Care Note  Patient: Melissa [name]  Procedure(s) Performed: APPENDECTOMY LAPAROSCOPIC (N/A )  Patient Location: PACU  Anesthesia Type:General  Level of Consciousness: awake  Airway & Oxygen Therapy: Patient Spontanous Breathing and Patient connected to face mask oxygen  Post-op Assessment: Report given to RN and Post -op Vital signs reviewed and stable  Post vital signs: Reviewed  Last Vitals:  Vitals Value Taken Time  BP 100/44 05/17/19 1732  Temp    Pulse 77 05/17/19 1733  Resp 25 05/17/19 1733  SpO2 100 % 05/17/19 1733  Vitals shown include unvalidated device data.  Last Pain:  Vitals:   05/17/19 1602  TempSrc: Oral  PainSc: 5          Complications: No apparent anesthesia complications

## 2019-05-17 NOTE — Anesthesia Preprocedure Evaluation (Signed)
Anesthesia Evaluation  Patient identified by MRN, date of birth, ID band Patient awake    Reviewed: Allergy & Precautions, NPO status , Patient's Chart, lab work & pertinent test results  Airway Mallampati: II  TM Distance: >3 FB     Dental  (+) Caps   Pulmonary neg pulmonary ROS,    Pulmonary exam normal        Cardiovascular hypertension, Pt. on medications Normal cardiovascular exam     Neuro/Psych negative neurological ROS  negative psych ROS   GI/Hepatic Neg liver ROS,   Endo/Other  negative endocrine ROS  Renal/GU negative Renal ROS  Female GU complaint     Musculoskeletal  (+) Arthritis , Osteoarthritis,    Abdominal Normal abdominal exam  (+)   Peds negative pediatric ROS (+)  Hematology negative hematology ROS (+)   Anesthesia Other Findings   Reproductive/Obstetrics                             Anesthesia Physical  Anesthesia Plan  ASA: II  Anesthesia Plan: General   Post-op Pain Management:    Induction: Intravenous  PONV Risk Score and Plan:   Airway Management Planned: Oral ETT  Additional Equipment:   Intra-op Plan:   Post-operative Plan: Extubation in OR  Informed Consent: I have reviewed the patients History and Physical, chart, labs and discussed the procedure including the risks, benefits and alternatives for the proposed anesthesia with the patient or authorized representative who has indicated his/her understanding and acceptance.     Dental advisory given  Plan Discussed with: CRNA and Surgeon  Anesthesia Plan Comments:         Anesthesia Quick Evaluation

## 2019-05-17 NOTE — H&P (Signed)
SURGICAL CONSULTATION NOTE   HISTORY OF PRESENT ILLNESS (HPI):  64 y.o. female presented to [facility] ED for evaluation of abdominal pain since last night. Patient reports has abdominal pain in the right lower quadrant.  It feels like a burning pain.  There is no pain radiation.  Alleviating factor has been the morphine given in the ED.  Aggravating factor is applying pressure.  She endorses some nausea.  She denies fever or chills.  She denies any rectal bleeding.  Upon complete evaluation in the ED she was found with mild leukocytosis.  CT scan of the abdominal pelvis was done showing dilation of the appendix with fluid around the appendix with fat stranding.  There is no organized abscess.  There is no free air.  I personally evaluated the images  Surgery is consulted by Dr. Jimmye Norman in this context for evaluation and management of acute appendicitis.  PAST MEDICAL HISTORY (PMH):  Past Medical History:  Diagnosis Date  . Cancer of uterine body ([place]) dx'd 04/14/10   radical hysterectomy, chemotherapy and radiation therapy  . Chronic cholecystitis   . Hypertension   . Osteoarthritis    HX OF SOME  OSTEOARTHRITIS     PAST SURGICAL HISTORY ([place]):  Past Surgical History:  Procedure Laterality Date  . ABDOMINAL HYSTERECTOMY  05-08-10   LAPAROSCOPIC ROBOTIC ASSISSTED TOTAL HYSTERECTOMY WITH BSO , BILAT. PELVIC AND PERIAORTIC LYMPH NODE EVAL. BY DR. Alycia Rossetti AT [facility]  . CESAREAN SECTION  01/08/80 11/21/82   x2  . CHOLECYSTECTOMY N/A 11/16/2017   Procedure: LAPAROSCOPIC CHOLECYSTECTOMY;  Surgeon: Vickie Epley, MD;  Location: [facility] ORS;  Service: General;  Laterality: N/A;  . COLONOSCOPY  [id number]     MEDICATIONS:  Prior to Admission medications   Medication Sig Start Date End Date Taking? Authorizing Provider  alendronate (FOSAMAX) 70 MG tablet Take 1 tablet (70 mg total) by mouth every 7 (seven) days. Take with a full glass of water on an empty stomach. 09/05/18   Bedsole, Amy E, MD  calcium  carbonate (TUMS - DOSED IN MG ELEMENTAL CALCIUM) 500 MG chewable tablet Chew 1 tablet by mouth 2 (two) times daily.     [provider]  cholecalciferol (VITAMIN D) 1000 units tablet Take 1,000 Units by mouth daily.    [provider]  glucosamine-chondroitin 500-400 MG tablet Take 2 tablets by mouth daily.    [provider]  meloxicam (MOBIC) 15 MG tablet TAKE 1 TABLET BY MOUTH EVERY DAY AS NEEDED FOR PAIN 05/14/19   Bedsole, Amy E, MD  ramipril (ALTACE) 5 MG capsule TAKE 1 CAPSULE BY MOUTH EVERY MORNING 02/19/19   Bedsole, Amy E, MD     ALLERGIES:  No Known Allergies   SOCIAL HISTORY:  Social History   Socioeconomic History  . Marital status: Married    Spouse name: Not on file  . Number of children: Not on file  . Years of education: Not on file  . Highest education level: Not on file  Occupational History  . Not on file  Tobacco Use  . Smoking status: Never Smoker  . Smokeless tobacco: Never Used  Substance and Sexual Activity  . Alcohol use: Yes    Comment: Occas-holidays  . Drug use: No  . Sexual activity: Never  Other Topics Concern  . Not on file  Social History Narrative   exercsie at [facility] 4-6 times a week.    Diet: good   Social Determinants of Radio broadcast assistant Strain:   .  Difficulty of Paying Living Expenses:   Food Insecurity:   . Worried About Charity fundraiser in the Last Year:   . Arboriculturist in the Last Year:   Transportation Needs:   . Film/video editor (Medical):   Marland Kitchen Lack of Transportation (Non-Medical):   Physical Activity:   . Days of Exercise per Week:   . Minutes of Exercise per Session:   Stress:   . Feeling of Stress :   Social Connections:   . Frequency of Communication with Friends and Family:   . Frequency of Social Gatherings with Friends and Family:   . Attends Religious Services:   . Active Member of Clubs or Organizations:   . Attends Archivist Meetings:   Marland Kitchen Marital  Status:   Intimate Partner Violence:   . Fear of Current or Ex-Partner:   . Emotionally Abused:   Marland Kitchen Physically Abused:   . Sexually Abused:       FAMILY HISTORY:  Family History  Problem Relation Age of Onset  . Colon cancer Maternal Grandfather        70's  . Pancreatic cancer Father   . Diabetes Brother   . Diabetes Brother      REVIEW OF SYSTEMS:  Constitutional: denies weight loss, fever, chills, or sweats  Eyes: denies any other vision changes, history of eye injury  ENT: denies sore throat, hearing problems  Respiratory: denies shortness of breath, wheezing  Cardiovascular: denies chest pain, palpitations  Gastrointestinal: positive abdominal pain, nausea Genitourinary: denies burning with urination or urinary frequency Musculoskeletal: denies any other joint pains or cramps  Skin: denies any other rashes or skin discolorations  Neurological: denies any other headache, dizziness, weakness  Psychiatric: denies any other depression, anxiety   All other review of systems were negative   VITAL SIGNS:  Temp:  [98.8 F (37.1 C)] 98.8 F (37.1 C) (03/25 1132) Pulse Rate:  [105] 105 (03/25 1132) Resp:  [18] 18 (03/25 1132) BP: (119)/(75) 119/75 (03/25 1132) SpO2:  [95 %] 95 % (03/25 1132) Weight:  [83.5 kg] 83.5 kg (03/25 1134)       Weight: 83.5 kg     INTAKE/OUTPUT:  This shift: No intake/output data recorded.  Last 2 shifts: @IOLAST2SHIFTS @   PHYSICAL EXAM:  Constitutional:  -- Normal body habitus  -- Awake, alert, and oriented x3  Eyes:  -- Pupils equally round and reactive to light  -- No scleral icterus  Ear, nose, and throat:  -- No jugular venous distension  Pulmonary:  -- No crackles  -- Equal breath sounds bilaterally -- Breathing non-labored at rest Cardiovascular:  -- S1, S2 present  -- No pericardial rubs Gastrointestinal:  -- Abdomen soft, tender to palpation in the right lower quadrant, non-distended, no guarding or rebound tenderness --  No abdominal masses appreciated, pulsatile or otherwise  Musculoskeletal and Integumentary:  -- Wounds or skin discoloration: None appreciated -- Extremities: B/L UE and LE FROM, hands and feet warm, no edema  Neurologic:  -- Motor function: intact and symmetric -- Sensation: intact and symmetric   Labs:  CBC Latest Ref Rng & Units 05/17/2019 11/14/2017 05/20/2017  WBC 4.0 - 10.5 K/uL 11.3(H) 6.5 7.6  Hemoglobin 12.0 - 15.0 g/dL 15.0 12.9 13.3  Hematocrit 36.0 - 46.0 % 45.5 37.6 39.9  Platelets 150 - 400 K/uL 327 225 253.0   CMP Latest Ref Rng & Units 05/17/2019 11/30/2018 11/25/2017  Glucose 70 - 99 mg/dL 144(H) 84 90  BUN  8 - 23 mg/dL 19 16 17   Creatinine 0.44 - 1.00 mg/dL 0.59 0.65 0.65  Sodium 135 - 145 mmol/L 138 140 138  Potassium 3.5 - 5.1 mmol/L 4.0 4.1 4.4  Chloride 98 - 111 mmol/L 103 105 102  CO2 22 - 32 mmol/L 23 27 30   Calcium 8.9 - 10.3 mg/dL 9.1 9.2 9.5  Total Protein 6.5 - 8.1 g/dL 7.7 7.1 7.2  Total Bilirubin 0.3 - 1.2 mg/dL 0.9 0.5 0.5  Alkaline Phos 38 - 126 U/L 55 63 68  AST 15 - 41 U/L 19 20 14   ALT 0 - 44 U/L 19 21 12     Imaging studies:  EXAM: CT ABDOMEN AND PELVIS WITH CONTRAST  TECHNIQUE: Multidetector CT imaging of the abdomen and pelvis was performed using the standard protocol following bolus administration of intravenous contrast.  CONTRAST:  133mL OMNIPAQUE IOHEXOL 300 MG/ML  SOLN  COMPARISON:  November 24, 2015  FINDINGS: Lower chest: There is atelectatic change in the anterior left base. Lung bases otherwise are clear.  Hepatobiliary: There is a 1.3 x 1.0 hemangioma in the posterior segment right lobe of the liver. There is a second apparent small hemangioma in the right lobe of the liver near the dome posteriorly measuring 0.9 x 0.8 cm. These findings were present previously. There is a 3 mm probable cyst in the left lobe of the liver. No new liver lesions are demonstrable. Gallbladder is absent. There is no appreciable biliary duct  dilatation.  Pancreas: There is no pancreatic mass or inflammatory focus.  Spleen: No splenic lesions are evident.  Adrenals/Urinary Tract: Adrenals bilaterally appear normal. Kidneys bilaterally show no evident mass or hydronephrosis on either side. There is no evident renal or ureteral calculus on either side. Urinary bladder is midline. Urinary bladder wall is mildly thickened.  Stomach/Bowel: There is moderate stool in the colon. There is fluid throughout most small bowel loops, a finding indicative of either ileus or enteritis. There is no appreciable bowel obstruction. The terminal ileum appears within normal limits. There is no demonstrable free air or portal venous air.  Vascular/Lymphatic: No abdominal aortic aneurysm. There is aortic atherosclerosis in the distal aorta. No adenopathy is evident in the abdomen or pelvis.  Reproductive: Uterus absent.  No pelvic mass.  Other: There is localized wall thickening at the base of the appendix, measuring 11 mm in diameter. There is tapering the appendix more distally. There is localized fluid at the distal aspect of the appendix. No well-defined abscess or perforation evident.  There is fluid in the dependent portion of the pelvis tracking into the lower left pelvis. No abscess seen in the abdomen or pelvis. There is mild mesenteric thickening in this area which could indicate early peritonitis.  Musculoskeletal: No blastic or lytic bone lesions are evident. There is degenerative change at L2-3 with vacuum phenomenon. There is no intramuscular or abdominal wall lesions.  IMPRESSION: 1.  Findings felt to be indicative of acute appendicitis.  Appendix: Location: Appendix arises from the inferior aspect of the cecum extending inferomedially in the right mid pelvis.  Diameter: 11 mm proximally with tapering distally.  Appendicolith: None  Mucosal hyper-enhancement: None  Extraluminal gas:  None  Periappendiceal collection: There is fluid which appears mildly loculated adjacent to the distal end of the appendix.  2. Ascites in the dependent portion the pelvis extending into the left pelvis. Question whether this fluid represent sympathetic response to the appendicitis. The fluid in the left pelvis potentially may represent developing  peritonitis. Mild adjacent mesenteric thickening in this area may be supportive of potential early peritonitis.  3. Fluid throughout most small bowel loops may represent ileus secondary to appendiceal inflammation or enteritis. No bowel obstruction.  4. Mild thickening of the urinary bladder wall could indicate cystitis or could have sympathetic response to nearby ascites and mild mesenteric thickening.  Critical Value/emergent results were called by telephone at the time of interpretation on 05/17/2019 at 2:34 pm to provider Lenise Arena , who verbally acknowledged these results.   Electronically Signed   By: Lowella Grip III M.D.   On: 05/17/2019 14:34  Assessment/Plan:  64 y.o. female with acute appendicitis, complicated by pertinent comorbidities including hypertension.  Patient with history, physical exam and images consistent with acute appendicitis. Patient oriented about diagnosis and surgical management as treatment. Patient oriented about goals of surgery and its risk including: bowel injury, infection, abscess, bleeding, leak from cecum, intestinal adhesions, bowel obstruction, fistula, injury to the ureter among others.  Due to the amount of fluid on the appendix I discussed with the patient the alternative of having lavage drainage versus ileocecectomy.  I discussed with the patient that I will make that decision intraoperatively. Patient understood and agreed to proceed with surgery. Will admit patient, already started on antibiotic therapy, will give IV hydration since patient is NPO and schedule to OR.    Arnold Long, MD

## 2019-05-17 NOTE — ED Provider Notes (Signed)
[facility] Emergency Department Provider Note       Time seen: ----------------------------------------- 1:31 PM on 05/17/2019 -----------------------------------------  I have reviewed the triage vital signs and the nursing notes.  HISTORY   Chief Complaint Abdominal Pain    HPI Melissa [name] is a 64 y.o. female with a history of uterine cancer, chronic cholecystitis, hypertension, osteoarthritis who presents to the ED for right lower quadrant abdominal pain.  Patient is also had some vomiting, denies any diarrhea.  She states her right side feels like it is on fire.  This began yesterday and progressed throughout the night.  She describes 5 out of 10 discomfort at this time.  Past Medical History:  Diagnosis Date  . Cancer of uterine body ([place]) dx'd 04/14/10   radical hysterectomy, chemotherapy and radiation therapy  . Chronic cholecystitis   . Hypertension   . Osteoarthritis    HX OF SOME  OSTEOARTHRITIS    Patient Active Problem List   Diagnosis Date Noted  . Osteoporosis 12/06/2014  . History of endometrial cancer 03/25/2011  . High cholesterol 03/23/2010  . 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 OSTEOARTHRITIS, HANDS, BILATERAL 11/22/2007  . FIBROIDS, UTERUS 09/30/2006  . Obesity due to excess calories with serious comorbidity 09/30/2006  . Essential hypertension, benign 09/30/2006    Past Surgical History:  Procedure Laterality Date  . ABDOMINAL HYSTERECTOMY  05-08-10   LAPAROSCOPIC ROBOTIC ASSISSTED TOTAL HYSTERECTOMY WITH BSO , BILAT. PELVIC AND PERIAORTIC LYMPH NODE EVAL. BY DR. Alycia Rossetti AT [facility]  . CESAREAN SECTION  01/08/80 11/21/82   x2  . CHOLECYSTECTOMY N/A 11/16/2017   Procedure: LAPAROSCOPIC CHOLECYSTECTOMY;  Surgeon: Vickie Epley, MD;  Location: [facility] ORS;  Service: General;  Laterality: N/A;  . COLONOSCOPY  [id number]    Allergies Patient has no known allergies.  Social History Social History   Tobacco Use   . Smoking status: Never Smoker  . Smokeless tobacco: Never Used  Substance Use Topics  . Alcohol use: Yes    Comment: Occas-holidays  . Drug use: No    Review of Systems Constitutional: Negative for fever. Cardiovascular: Negative for chest pain. Respiratory: Negative for shortness of breath. Gastrointestinal: Positive for abdominal pain, vomiting Musculoskeletal: Negative for back pain. Skin: Negative for rash. Neurological: Negative for headaches, focal weakness or numbness.  All systems negative/normal/unremarkable except as stated in the HPI  ____________________________________________   PHYSICAL EXAM:  VITAL SIGNS: ED Triage Vitals  Enc Vitals Group     BP 05/17/19 1132 119/75     Pulse Rate 05/17/19 1132 (!) 105     Resp 05/17/19 1132 18     Temp 05/17/19 1132 98.8 F (37.1 C)     Temp Source 05/17/19 1132 Oral     SpO2 05/17/19 1132 95 %     Weight 05/17/19 1134 184 lb (83.5 kg)     Height --      Head Circumference --      Peak Flow --      Pain Score 05/17/19 1134 5     Pain Loc --      Pain Edu? --      Excl. in [place]? --     Constitutional: Alert and oriented. Well appearing and in no distress. Eyes: Conjunctivae are normal. Normal extraocular movements. Cardiovascular: Normal rate, regular rhythm. No murmurs, rubs, or gallops. Respiratory: Normal respiratory effort without tachypnea nor retractions. Breath sounds are clear and equal bilaterally. No wheezes/rales/rhonchi. Gastrointestinal: Soft and nontender. Normal bowel sounds Musculoskeletal: Nontender  with normal range of motion in extremities. No lower extremity tenderness nor edema. Neurologic:  Normal speech and language. No gross focal neurologic deficits are appreciated.  Skin:  Skin is warm, dry and intact. No rash noted. Psychiatric: Mood and affect are normal. Speech and behavior are normal.  ____________________________________________  EKG: Interpreted by me.  Sinus rhythm with rate of  100 bpm, normal PR interval, normal QRS, normal QT  ____________________________________________  ED COURSE:  As part of my medical decision making, I reviewed the following data within the [place] History obtained from family if available, nursing notes, old chart and ekg, as well as notes from prior ED visits. Patient presented for abdominal pain, we will assess with labs and imaging as indicated at this time.   Procedures  Melissa [name] was evaluated in Emergency Department on 05/17/2019 for the symptoms described in the history of present illness. She was evaluated in the context of the global COVID-19 pandemic, which necessitated consideration that the patient might be at risk for infection with the SARS-CoV-2 virus that causes COVID-19. Institutional protocols and algorithms that pertain to the evaluation of patients at risk for COVID-19 are in a state of rapid change based on information released by regulatory bodies including the CDC and federal and state organizations. These policies and algorithms were followed during the patient's care in the ED.  ____________________________________________   LABS (pertinent positives/negatives)  Labs Reviewed  COMPREHENSIVE METABOLIC PANEL - Abnormal; Notable for the following components:      Result Value   Glucose, Bld 144 (*)    All other components within normal limits  CBC - Abnormal; Notable for the following components:   WBC 11.3 (*)    RBC 5.19 (*)    All other components within normal limits  LIPASE, BLOOD  URINALYSIS, COMPLETE (UACMP) WITH MICROSCOPIC    RADIOLOGY Images were viewed by me  CT of the abdomen pelvis with contrast  IMPRESSION: 1.  Findings felt to be indicative of acute appendicitis.  Appendix: Location: Appendix arises from the inferior aspect of the cecum extending inferomedially in the right mid pelvis.  Diameter: 11 mm proximally with tapering distally.  Appendicolith:  None  Mucosal hyper-enhancement: None  Extraluminal gas: None  Periappendiceal collection: There is fluid which appears mildly loculated adjacent to the distal end of the appendix.  2. Ascites in the dependent portion the pelvis extending into the left pelvis. Question whether this fluid represent sympathetic response to the appendicitis. The fluid in the left pelvis potentially may represent developing peritonitis. Mild adjacent mesenteric thickening in this area may be supportive of potential early peritonitis.  3. Fluid throughout most small bowel loops may represent ileus secondary to appendiceal inflammation or enteritis. No bowel obstruction.  4. Mild thickening of the urinary bladder wall could indicate cystitis or could have sympathetic response to nearby ascites and mild mesenteric thickening.  Critical Value/emergent results were called by telephone at the time of interpretation on 05/17/2019 at 2:34 pm to provider Lenise Arena , who verbally acknowledged these results. ____________________________________________   DIFFERENTIAL DIAGNOSIS   Renal colic, UTI, pyelonephritis, appendicitis, scar tissue, gas pain, constipation  FINAL ASSESSMENT AND PLAN  Acute appendicitis  Plan: The patient had presented for right-sided abdominal pain. Patient's labs revealed mild leukocytosis, patient's imaging revealed a possible appendicitis.  I will order Rocephin for her and consult general surgery for evaluation.   Laurence Aly, MD    Note: This note was generated in part or  whole with voice recognition software. Voice recognition is usually quite accurate but there are transcription errors that can and very often do occur. I apologize for any typographical errors that were not detected and corrected.     Earleen Newport, MD 05/17/19 1438

## 2019-05-18 LAB — HIV ANTIBODY (ROUTINE TESTING W REFLEX): HIV Screen 4th Generation wRfx: NONREACTIVE

## 2019-05-18 MED ORDER — HYDROCODONE-ACETAMINOPHEN 5-325 MG PO TABS: 1.0000 | ORAL_TABLET | ORAL | 0 refills | Status: AC | PRN

## 2019-05-18 NOTE — Discharge Summary (Signed)
  Patient ID: Melissa [name] MRN: [id number] DOB/AGE: 08-25-1955 64 y.o.  Admit date: 05/17/2019 Discharge date: 05/18/2019   Discharge Diagnoses:  Active Problems:   Acute appendicitis with localized peritonitis   Procedures: Laparoscopic appendectomy  Hospital Course: Patient with acute appendicitis. Underwent lap appendectomy. Tolerated procedure well. Today tolerating diet, ambulating, pain control. Wounds are dry and clean.   Physical Exam  Constitutional: She is oriented to person, place, and time and well-developed, well-nourished, and in no distress.  Pulmonary/Chest: Effort normal.  Abdominal: Soft. She exhibits no distension. There is no abdominal tenderness.  Neurological: She is alert and oriented to person, place, and time.  Wounds are dry and clean.    Consults: None  Disposition: Discharge disposition: 01-Home or Self Care       Discharge Instructions    Diet - low sodium heart healthy   Complete by: As directed    Increase activity slowly   Complete by: As directed      Allergies as of 05/18/2019   No Known Allergies     Medication List    TAKE these medications   alendronate 70 MG tablet Commonly known as: FOSAMAX Take 1 tablet (70 mg total) by mouth every 7 (seven) days. Take with a full glass of water on an empty stomach. What changed: when to take this   calcium carbonate 500 MG chewable tablet Commonly known as: TUMS - dosed in mg elemental calcium Chew 1 tablet by mouth 2 (two) times daily.   cholecalciferol 1000 units tablet Commonly known as: VITAMIN D Take 1,000 Units by mouth daily.   docusate sodium 100 MG capsule Commonly known as: COLACE Take 100 mg by mouth daily as needed for mild constipation.   glucosamine-chondroitin 500-400 MG tablet Take 2 tablets by mouth daily.   HYDROcodone-acetaminophen 5-325 MG tablet Commonly known as: Norco Take 1 tablet by mouth every 4 (four) hours as needed for up to 3 days for moderate  pain.   meloxicam 15 MG tablet Commonly known as: MOBIC TAKE 1 TABLET BY MOUTH EVERY DAY AS NEEDED FOR PAIN What changed: See the new instructions.   ramipril 5 MG capsule Commonly known as: ALTACE TAKE 1 CAPSULE BY MOUTH EVERY MORNING What changed: when to take this      Follow-up Information    Herbert Pun, MD Follow up in 2 week(s).   Specialty: General Surgery Contact information: [address] [place] [address] [phone]

## 2019-05-18 NOTE — Plan of Care (Signed)
Discharge order received. Patient mental status is at baseline. Vital signs stable . No signs of acute distress. Discharge instructions given. Patient verbalized understanding. No other issues noted at this time.   

## 2019-05-18 NOTE — Discharge Instructions (Signed)
  Diet: Resume home heart healthy regular diet.   Activity: Increase activity as tolerated. Light activity and walking are encouraged. Do not drive or drink alcohol if taking narcotic pain medications.  Wound care: May shower with soapy water and pat dry (do not rub incisions), but no baths or submerging incision underwater until follow-up. (no swimming)   Medications: Resume all home medications. For mild to moderate pain: acetaminophen (Tylenol) or ibuprofen (if no kidney disease). Combining Tylenol with alcohol can substantially increase your risk of causing liver disease. Narcotic pain medications, if prescribed, can be used for severe pain, though may cause nausea, constipation, and drowsiness. Do not combine Tylenol and Norco within a 6 hour period as Norco contains Tylenol. If you do not need the narcotic pain medication, you do not need to fill the prescription.  Call office ([phone]) at any time if any questions, worsening pain, fevers/chills, bleeding, drainage from incision site, or other concerns.  

## 2019-05-22 LAB — SURGICAL PATHOLOGY

## 2019-06-05 ENCOUNTER — Inpatient Hospital Stay: Payer: Medicare HMO

## 2019-06-05 ENCOUNTER — Other Ambulatory Visit: Payer: Self-pay

## 2019-06-05 ENCOUNTER — Inpatient Hospital Stay: Payer: Medicare HMO | Attending: Internal Medicine | Admitting: Internal Medicine

## 2019-06-05 ENCOUNTER — Encounter: Payer: Self-pay | Admitting: Internal Medicine

## 2019-06-05 DIAGNOSIS — Z923 Personal history of irradiation: Secondary | ICD-10-CM | POA: Insufficient documentation

## 2019-06-05 DIAGNOSIS — Z9049 Acquired absence of other specified parts of digestive tract: Secondary | ICD-10-CM | POA: Diagnosis not present

## 2019-06-05 DIAGNOSIS — Z800 Family history of malignant neoplasm of digestive organs: Secondary | ICD-10-CM | POA: Diagnosis not present

## 2019-06-05 DIAGNOSIS — Z85038 Personal history of other malignant neoplasm of large intestine: Secondary | ICD-10-CM

## 2019-06-05 DIAGNOSIS — Z8542 Personal history of malignant neoplasm of other parts of uterus: Secondary | ICD-10-CM | POA: Insufficient documentation

## 2019-06-05 DIAGNOSIS — C181 Malignant neoplasm of appendix: Secondary | ICD-10-CM

## 2019-06-05 DIAGNOSIS — Z9221 Personal history of antineoplastic chemotherapy: Secondary | ICD-10-CM | POA: Diagnosis not present

## 2019-06-05 DIAGNOSIS — Z90710 Acquired absence of both cervix and uterus: Secondary | ICD-10-CM | POA: Diagnosis not present

## 2019-06-05 NOTE — Progress Notes (Signed)
[place] NOTE  Patient Care Team: Jinny Sanders, MD as PCP - General (Family Medicine) Nancy Marus, MD as Consulting Physician (Gynecologic Oncology) Vickie Epley, MD (General Surgery) Ladene Artist, MD as Consulting Physician (Gastroenterology) Clent Jacks, RN as Oncology Nurse Navigator  CHIEF COMPLAINTS/PURPOSE OF CONSULTATION: Appendix cancer  #  Oncology History Overview Note  # 2012-UTERINE CANCER- sp TAH [[facility]];chemo-RT- [facility]; Dr.Gehrog]  # LAMN- appendeixa Procedure: Appendectomy  Tumor site: Proximal half       Base of appendix uninvolved by tumor  Tumor size: 1.7 cm in greatest dimension  Histologic type: Low-grade appendiceal mucinous neoplasm  Histologic grade: Well-differentiated (G1)  Tumor Extension: Acellular mucin invades subserosa or mesoappendix but  does not extend to  the serosal surface  Proximal margin: Uninvolved by low-grade appendiceal mucinous neoplasm  Mesenteric margin: Uninvolved by low-grade appendiceal mucinous neoplasm  Lymphovascular invasion: Not identified  Lymph nodes: No lymph nodes submitted or found  Pathologic Stage Classification (pTNM, AJCC 8th ed): pT3 pNX    History of endometrial cancer  05/08/2010 Initial Diagnosis   Cancer of uterus, IIIC1    - 12/01/2010 Chemotherapy   completed chemo therapy with 6 cycles of paclitaxel and carboplatin and vaginal cuff brachytherapy on GOG 258   Primary cancer of appendix ([place])  06/05/2019 Initial Diagnosis   Primary cancer of appendix ([place])      HISTORY OF PRESENTING ILLNESS:  Melissa [name] 64 y.o.  female prior history of uterine cancer in 2012-is here to discuss her recent diagnosis of appendix cancer.  Patient was admitted to hospital in end of March for worsening abdominal pain-CT scan abdomen pelvis suggestive of "appendicitis"-with no other concerning findings suggestive of malignancy.  Patient underwent laparoscopic  appendectomy.  Surprisingly-found to have appendiceal malignancy.  Patient is here for further discussion treatment options.   Review of Systems  Constitutional: Negative for chills, diaphoresis, fever, malaise/fatigue and weight loss.  HENT: Negative for nosebleeds and sore throat.   Eyes: Negative for double vision.  Respiratory: Negative for cough, hemoptysis, sputum production, shortness of breath and wheezing.   Cardiovascular: Negative for chest pain, palpitations, orthopnea and leg swelling.  Gastrointestinal: Negative for abdominal pain, blood in stool, constipation, diarrhea, heartburn, melena, nausea and vomiting.  Genitourinary: Negative for dysuria, frequency and urgency.  Musculoskeletal: Negative for back pain and joint pain.  Skin: Negative.  Negative for itching and rash.  Neurological: Negative for dizziness, tingling, focal weakness, weakness and headaches.  Endo/Heme/Allergies: Does not bruise/bleed easily.  Psychiatric/Behavioral: Negative for depression. The patient is not nervous/anxious and does not have insomnia.      MEDICAL HISTORY:  Past Medical History:  Diagnosis Date  . Cancer of uterine body ([place]) dx'd 04/14/10   radical hysterectomy, chemotherapy and radiation therapy  . Chronic cholecystitis   . Hypertension   . Osteoarthritis    HX OF SOME  OSTEOARTHRITIS    SURGICAL HISTORY: Past Surgical History:  Procedure Laterality Date  . ABDOMINAL HYSTERECTOMY  05-08-10   LAPAROSCOPIC ROBOTIC ASSISSTED TOTAL HYSTERECTOMY WITH BSO , BILAT. PELVIC AND PERIAORTIC LYMPH NODE EVAL. BY DR. Alycia Rossetti AT [facility]  . CESAREAN SECTION  01/08/80 11/21/82   x2  . CHOLECYSTECTOMY N/A 11/16/2017   Procedure: LAPAROSCOPIC CHOLECYSTECTOMY;  Surgeon: Vickie Epley, MD;  Location: [facility] ORS;  Service: General;  Laterality: N/A;  . COLONOSCOPY  [id number]  . LAPAROSCOPIC APPENDECTOMY N/A 05/17/2019   Procedure: APPENDECTOMY LAPAROSCOPIC;  Surgeon: Herbert Pun, MD;   Location: [facility]  ORS;  Service: General;  Laterality: N/A;    SOCIAL HISTORY: Social History   Socioeconomic History  . Marital status: Married    Spouse name: Not on file  . Number of children: Not on file  . Years of education: Not on file  . Highest education level: Not on file  Occupational History  . Not on file  Tobacco Use  . Smoking status: Never Smoker  . Smokeless tobacco: Never Used  Substance and Sexual Activity  . Alcohol use: Yes    Comment: Occas-holidays  . Drug use: No  . Sexual activity: Never  Other Topics Concern  . Not on file  Social History Narrative   exercsie at [facility] 4-6 times a week.;  Diet: good;      In [place]; with husband; 2 sons; never smoked; seldom alcohol.    Social Determinants of Health   Financial Resource Strain:   . Difficulty of Paying Living Expenses:   Food Insecurity:   . Worried About Charity fundraiser in the Last Year:   . Arboriculturist in the Last Year:   Transportation Needs:   . Film/video editor (Medical):   Marland Kitchen Lack of Transportation (Non-Medical):   Physical Activity:   . Days of Exercise per Week:   . Minutes of Exercise per Session:   Stress:   . Feeling of Stress :   Social Connections:   . Frequency of Communication with Friends and Family:   . Frequency of Social Gatherings with Friends and Family:   . Attends Religious Services:   . Active Member of Clubs or Organizations:   . Attends Archivist Meetings:   Marland Kitchen Marital Status:   Intimate Partner Violence:   . Fear of Current or Ex-Partner:   . Emotionally Abused:   Marland Kitchen Physically Abused:   . Sexually Abused:     FAMILY HISTORY: Family History  Problem Relation Age of Onset  . Colon cancer Maternal Grandfather        70's  . Pancreatic cancer Father        21s.   . Diabetes Brother   . Diabetes Brother     ALLERGIES:  has No Known Allergies.  MEDICATIONS:  Current Outpatient Medications  Medication Sig Dispense Refill  .  alendronate (FOSAMAX) 70 MG tablet Take 1 tablet (70 mg total) by mouth every 7 (seven) days. Take with a full glass of water on an empty stomach. (Patient taking differently: Take 70 mg by mouth every Saturday. Take with a full glass of water on an empty stomach.) 4 tablet 11  . calcium carbonate (TUMS - DOSED IN MG ELEMENTAL CALCIUM) 500 MG chewable tablet Chew 1 tablet by mouth 2 (two) times daily.     . cholecalciferol (VITAMIN D) 1000 units tablet Take 1,000 Units by mouth daily.    Marland Kitchen docusate sodium (COLACE) 100 MG capsule Take 100 mg by mouth daily as needed for mild constipation.    Marland Kitchen glucosamine-chondroitin 500-400 MG tablet Take 2 tablets by mouth daily.    . meloxicam (MOBIC) 15 MG tablet TAKE 1 TABLET BY MOUTH EVERY DAY AS NEEDED FOR PAIN (Patient taking differently: Take 15 mg by mouth daily as needed for pain. ) 90 tablet 0  . ramipril (ALTACE) 5 MG capsule TAKE 1 CAPSULE BY MOUTH EVERY MORNING (Patient taking differently: Take 5 mg by mouth daily. ) 90 capsule 2   No current facility-administered medications for this visit.      Marland Kitchen  PHYSICAL EXAMINATION: ECOG PERFORMANCE STATUS: 0 - Asymptomatic  Vitals:   06/05/19 1124  BP: (!) 145/86  Pulse: 71  Temp: (!) 96.2 F (35.7 C)   Filed Weights   06/05/19 1124  Weight: 190 lb (86.2 kg)    Physical Exam  Constitutional: She is oriented to person, place, and time and well-developed, well-nourished, and in no distress.  HENT:  Head: Normocephalic and atraumatic.  Mouth/Throat: Oropharynx is clear and moist. No oropharyngeal exudate.  Eyes: Pupils are equal, round, and reactive to light.  Cardiovascular: Normal rate and regular rhythm.  Pulmonary/Chest: Effort normal and breath sounds normal. No respiratory distress. She has no wheezes.  Abdominal: Soft. Bowel sounds are normal. She exhibits no distension and no mass. There is no abdominal tenderness. There is no rebound and no guarding.  Musculoskeletal:        General:  No tenderness or edema. Normal range of motion.     Cervical back: Normal range of motion and neck supple.  Neurological: She is alert and oriented to person, place, and time.  Skin: Skin is warm.  Psychiatric: Affect normal.     LABORATORY DATA:  I have reviewed the data as listed Lab Results  Component Value Date   WBC 11.3 (H) 05/17/2019   HGB 15.0 05/17/2019   HCT 45.5 05/17/2019   MCV 87.7 05/17/2019   PLT 327 05/17/2019   Recent Labs    11/30/18 0744 05/17/19 1142  NA 140 138  K 4.1 4.0  CL 105 103  CO2 27 23  GLUCOSE 84 144*  BUN 16 19  CREATININE 0.65 0.59  CALCIUM 9.2 9.1  GFRNONAA  --  >60  GFRAA  --  >60  PROT 7.1 7.7  ALBUMIN 4.2 4.3  AST 20 19  ALT 21 19  ALKPHOS 63 55  BILITOT 0.5 0.9    RADIOGRAPHIC STUDIES: I have personally reviewed the radiological images as listed and agreed with the findings in the report. CT ABDOMEN PELVIS W CONTRAST  Result Date: 05/17/2019 CLINICAL DATA:  Right-sided pain EXAM: CT ABDOMEN AND PELVIS WITH CONTRAST TECHNIQUE: Multidetector CT imaging of the abdomen and pelvis was performed using the standard protocol following bolus administration of intravenous contrast. CONTRAST:  149m OMNIPAQUE IOHEXOL 300 MG/ML  SOLN COMPARISON:  November 24, 2015 FINDINGS: Lower chest: There is atelectatic change in the anterior left base. Lung bases otherwise are clear. Hepatobiliary: There is a 1.3 x 1.0 hemangioma in the posterior segment right lobe of the liver. There is a second apparent small hemangioma in the right lobe of the liver near the dome posteriorly measuring 0.9 x 0.8 cm. These findings were present previously. There is a 3 mm probable cyst in the left lobe of the liver. No new liver lesions are demonstrable. Gallbladder is absent. There is no appreciable biliary duct dilatation. Pancreas: There is no pancreatic mass or inflammatory focus. Spleen: No splenic lesions are evident. Adrenals/Urinary Tract: Adrenals bilaterally appear  normal. Kidneys bilaterally show no evident mass or hydronephrosis on either side. There is no evident renal or ureteral calculus on either side. Urinary bladder is midline. Urinary bladder wall is mildly thickened. Stomach/Bowel: There is moderate stool in the colon. There is fluid throughout most small bowel loops, a finding indicative of either ileus or enteritis. There is no appreciable bowel obstruction. The terminal ileum appears within normal limits. There is no demonstrable free air or portal venous air. Vascular/Lymphatic: No abdominal aortic aneurysm. There is aortic atherosclerosis in the distal  aorta. No adenopathy is evident in the abdomen or pelvis. Reproductive: Uterus absent.  No pelvic mass. Other: There is localized wall thickening at the base of the appendix, measuring 11 mm in diameter. There is tapering the appendix more distally. There is localized fluid at the distal aspect of the appendix. No well-defined abscess or perforation evident. There is fluid in the dependent portion of the pelvis tracking into the lower left pelvis. No abscess seen in the abdomen or pelvis. There is mild mesenteric thickening in this area which could indicate early peritonitis. Musculoskeletal: No blastic or lytic bone lesions are evident. There is degenerative change at L2-3 with vacuum phenomenon. There is no intramuscular or abdominal wall lesions. IMPRESSION: 1.  Findings felt to be indicative of acute appendicitis. Appendix: Location: Appendix arises from the inferior aspect of the cecum extending inferomedially in the right mid pelvis. Diameter: 11 mm proximally with tapering distally. Appendicolith: None Mucosal hyper-enhancement: None Extraluminal gas: None Periappendiceal collection: There is fluid which appears mildly loculated adjacent to the distal end of the appendix. 2. Ascites in the dependent portion the pelvis extending into the left pelvis. Question whether this fluid represent sympathetic response  to the appendicitis. The fluid in the left pelvis potentially may represent developing peritonitis. Mild adjacent mesenteric thickening in this area may be supportive of potential early peritonitis. 3. Fluid throughout most small bowel loops may represent ileus secondary to appendiceal inflammation or enteritis. No bowel obstruction. 4. Mild thickening of the urinary bladder wall could indicate cystitis or could have sympathetic response to nearby ascites and mild mesenteric thickening. Critical Value/emergent results were called by telephone at the time of interpretation on 05/17/2019 at 2:34 pm to provider Lenise Arena , who verbally acknowledged these results. Electronically Signed   By: Lowella Grip III M.D.   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   #Based upon histology no concerning features noted for recurrence.  Would not recommend any further therapeutic interventions.  Risk of recurrence is quite small.  Would not recommend surgery or any adjuvant chemotherapy.  With regards to surveillance given the low risk of recurrence-I would not recommend any imaging surveillance at this time.  #History of uterine cancer-stage III s/p chemoradiation 2012.  Most likely cured.  #Genetic susceptibility-question.  We will look into if MSI testing could be ordered on the uterine histology.  Otherwise clinically-less suspicious for any genetic susceptibility.  #Screening colonoscopy-recommend follow-up with GI; Dr. Marliss Czar for screening.  Thank you Dr.Cintron for allowing me to participate in the care of your pleasant patient. Please do not hesitate to contact me with questions or concerns in the interim.  We will also discuss at tumor conference.  Discussed with Roderic Ovens.  # DISPOSITION: # follow up as needed-Dr.B  All  questions were answered. The patient knows to call the clinic with any problems, questions or concerns.    Cammie Sickle, MD 06/05/2019 12:50 PM

## 2019-06-05 NOTE — Progress Notes (Signed)
Request for MMR sent to [facility] pathology. Specimen [id number] collected 05/08/2010.

## 2019-06-05 NOTE — Assessment & Plan Note (Addendum)
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   #Based upon histology no concerning features noted for recurrence.  Would not recommend any further therapeutic interventions.  Risk of recurrence is quite small.  Would not recommend surgery or any adjuvant chemotherapy.  With regards to surveillance given the low risk of recurrence-I would not recommend any imaging surveillance at this time.  #History of uterine cancer-stage III s/p chemoradiation 2012.  Most likely cured.  #Genetic susceptibility-question.  We will look into if MSI testing could be ordered on the uterine histology.  Otherwise clinically-less suspicious for any genetic susceptibility.  #Screening colonoscopy-recommend follow-up with GI; Dr. Marliss Czar for screening.  Thank you Dr.Cintron for allowing me to participate in the care of your pleasant patient. Please do not hesitate to contact me with questions or concerns in the interim.  We will also discuss at tumor conference.  Discussed with Roderic Ovens.  # DISPOSITION: # follow up as needed-Dr.B

## 2019-06-06 ENCOUNTER — Telehealth: Payer: Self-pay | Admitting: Gastroenterology

## 2019-06-06 NOTE — Telephone Encounter (Signed)
Agree with need to have colonoscopy in next couple months.  Reviewing last colonoscopy she needs an extended bowel prep.

## 2019-06-07 ENCOUNTER — Encounter: Payer: Self-pay | Admitting: Gastroenterology

## 2019-06-07 ENCOUNTER — Other Ambulatory Visit: Payer: Medicare HMO

## 2019-06-07 ENCOUNTER — Other Ambulatory Visit: Payer: Self-pay

## 2019-06-07 DIAGNOSIS — C181 Malignant neoplasm of appendix: Secondary | ICD-10-CM

## 2019-06-07 DIAGNOSIS — C541 Malignant neoplasm of endometrium: Secondary | ICD-10-CM | POA: Diagnosis not present

## 2019-06-07 DIAGNOSIS — Z8542 Personal history of malignant neoplasm of other parts of uterus: Secondary | ICD-10-CM

## 2019-06-07 DIAGNOSIS — C55 Malignant neoplasm of uterus, part unspecified: Secondary | ICD-10-CM | POA: Diagnosis not present

## 2019-06-07 NOTE — Progress Notes (Signed)
Called and spoke with Ms Jannifer Rodney regarding tumor board. Recommendation remains no further surgery warranted. With family history of pancreatic and colon cancer she was offered a Dietitian referral. She is interested in speaking with counselor. Referral placed.

## 2019-06-07 NOTE — Progress Notes (Signed)
Tumor Board Documentation  Melissa [name] was presented by Dr Rogue Bussing at our Tumor Board on 06/07/2019, which included representatives from medical oncology, radiation oncology, navigation, pathology, radiology, surgical, surgical oncology, internal medicine, genetics, palliative care, research.  Melissa [name] currently presents as a new patient, for [place], for new positive pathology with history of the following treatments: active survellience, surgical intervention(s).  Additionally, we reviewed previous medical and familial history, history of present illness, and recent lab results along with all available histopathologic and imaging studies. The tumor board considered available treatment options and made the following recommendations: Additional screening(Screening Colonoscopy) Further treatment to be determined  The following procedures/referrals were also placed: No orders of the defined types were placed in this encounter.   Clinical Trial Status: not discussed   Staging used: Pathologic Stage  AJCC Staging: T: p3 N: X   Group: Grade 1 Appendix Cancer  National site-specific guidelines   were discussed with respect to the case.  Tumor board is a meeting of clinicians from various specialty areas who evaluate and discuss patients for whom a multidisciplinary approach is being considered. Final determinations in the plan of care are those of the provider(s). The responsibility for follow up of recommendations given during tumor board is that of the provider.   Today's extended care, comprehensive team conference, Melissa [name] was not present for the discussion and was not examined.   Multidisciplinary Tumor Board is a multidisciplinary case peer review process.  Decisions discussed in the Multidisciplinary Tumor Board reflect the opinions of the specialists present at the conference without having examined the patient.  Ultimately, treatment and diagnostic decisions rest with the primary  provider(s) and the patient.

## 2019-06-11 ENCOUNTER — Other Ambulatory Visit: Payer: Self-pay | Admitting: Family Medicine

## 2019-06-11 DIAGNOSIS — Z1231 Encounter for screening mammogram for malignant neoplasm of breast: Secondary | ICD-10-CM

## 2019-06-13 ENCOUNTER — Telehealth: Payer: Self-pay

## 2019-06-13 ENCOUNTER — Encounter: Payer: Self-pay | Admitting: Genetic Counselor

## 2019-06-13 ENCOUNTER — Inpatient Hospital Stay (HOSPITAL_BASED_OUTPATIENT_CLINIC_OR_DEPARTMENT_OTHER): Payer: Medicare HMO | Admitting: Genetic Counselor

## 2019-06-13 DIAGNOSIS — Z8041 Family history of malignant neoplasm of ovary: Secondary | ICD-10-CM | POA: Diagnosis not present

## 2019-06-13 DIAGNOSIS — Z8542 Personal history of malignant neoplasm of other parts of uterus: Secondary | ICD-10-CM

## 2019-06-13 DIAGNOSIS — Z800 Family history of malignant neoplasm of digestive organs: Secondary | ICD-10-CM

## 2019-06-13 DIAGNOSIS — R69 Illness, unspecified: Secondary | ICD-10-CM | POA: Diagnosis not present

## 2019-06-13 DIAGNOSIS — C181 Malignant neoplasm of appendix: Secondary | ICD-10-CM

## 2019-06-13 NOTE — Telephone Encounter (Signed)
MMR for case [id number] collected 05/08/2010 has resulted 06/12/2019. Copy given to Dr. Rogue Bussing and she has genetic counseling appointment today at 60.   + Immunohistochemistry (IHC) Testing for Mismatch Repair (MMR) Proteins (Block A6) + ___ MLH1:  Intact nuclear expression (focal)   + ___ MSH2:  Intact nuclear expression   + ___ MSH6:  Intact nuclear expression   + ___ PMS2:  Loss of nuclear expression __________________ + ___ Background nonneoplastic tissue/internal control with intact nuclear expression   + IHC Interpretation + ___ Loss of nuclear expression of PMS2 only: high probability of Lynch syndrome (sequencing and/or large deletion/duplication testing of germline PMS2 is indicated)# # There are exceptions to the above IHC interpretations. These results should not be considered in isolation, and clinical correlation with genetic counseling is recommended to assess the need for germline testing.

## 2019-06-13 NOTE — Progress Notes (Signed)
REFERRING PROVIDER: Cammie Sickle, MD [place],  [place] [address]  PRIMARY PROVIDER:  Jinny Sanders, MD  PRIMARY REASON FOR VISIT:  1. Primary cancer of appendix ([place])   2. Family history of pancreatic cancer   3. Family history of ovarian cancer   4. Family history of colon cancer   5. History of endometrial cancer      HISTORY OF PRESENT ILLNESS:  I connected with  [name] on 06/13/2019 at 1:07 PM EDT by [place] video conference and verified that I am speaking with the correct person using two identifiers.   Patient location: Home Provider location: [facility]   [name], a 64 y.o. female, was seen for a [facility] cancer genetics consultation at the request of Dr. Rogue Bussing due to a personal and family history of cancer.  [name] presents to clinic today to discuss the possibility of a hereditary predisposition to cancer, genetic testing, and to further clarify her future cancer risks, as well as potential cancer risks for family members.   In 2014, at the age of 21, [name] was diagnosed with Stage IIIC endometrial cancer.  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 [[facility]];chemo-RT- Brachy [[facility]; Dr.Gehrog]  # [facility]- appendeixa Procedure: Appendectomy  Tumor site: Proximal half       Base of appendix uninvolved by tumor  Tumor size: 1.7 cm in greatest dimension  Histologic type: Low-grade appendiceal mucinous neoplasm  Histologic grade: Well-differentiated (G1)  Tumor Extension: Acellular mucin invades subserosa or mesoappendix but  does not extend to  the serosal surface  Proximal margin: Uninvolved by low-grade appendiceal mucinous neoplasm  Mesenteric margin: Uninvolved by  low-grade appendiceal mucinous neoplasm  Lymphovascular invasion: Not identified  Lymph nodes: No lymph nodes submitted or found  Pathologic Stage Classification (pTNM, AJCC 8th ed): pT3 pNX    History of endometrial cancer  05/08/2010 Initial Diagnosis   Cancer of uterus, IIIC1    - 12/01/2010 Chemotherapy   completed chemo therapy with 6 cycles of paclitaxel and carboplatin and vaginal cuff brachytherapy on GOG 258   Primary cancer of appendix ([place])  06/05/2019 Initial Diagnosis   Primary cancer of appendix ([place])      RISK FACTORS:  Menarche was at age 83.  First live birth at age 70.  Ovaries intact: no.  Hysterectomy: yes.  Menopausal status: postmenopausal.  HRT use: 0 years. Colonoscopy: yes; 3 polyps. Mammogram within the last year: yes. Number of breast biopsies: 0. Up to date with pelvic exams: yes. Any excessive radiation exposure in the past: no  Past Medical History:  Diagnosis Date  . Cancer of uterine body ([place]) dx'd 04/14/10   radical hysterectomy, chemotherapy and radiation therapy  . Chronic cholecystitis   . Family history of colon cancer   . Family history of ovarian cancer   . Family history of pancreatic cancer   . Hypertension   . Osteoarthritis    HX OF SOME  OSTEOARTHRITIS    Past Surgical History:  Procedure Laterality Date  . ABDOMINAL HYSTERECTOMY  05-08-10   LAPAROSCOPIC ROBOTIC ASSISSTED TOTAL HYSTERECTOMY WITH BSO , BILAT. PELVIC AND PERIAORTIC LYMPH NODE EVAL. BY DR. Alycia Rossetti AT [facility]  .  CESAREAN SECTION  01/08/80 11/21/82   x2  . CHOLECYSTECTOMY N/A 11/16/2017   Procedure: LAPAROSCOPIC CHOLECYSTECTOMY;  Surgeon: Vickie Epley, MD;  Location: [facility] ORS;  Service: General;  Laterality: N/A;  . COLONOSCOPY  [phone]  . LAPAROSCOPIC APPENDECTOMY N/A 05/17/2019   Procedure: APPENDECTOMY LAPAROSCOPIC;  Surgeon: Herbert Pun, MD;  Location: [facility] ORS;  Service: General;  Laterality: N/A;    Social History   Socioeconomic History   . Marital status: Married    Spouse name: Not on file  . Number of children: Not on file  . Years of education: Not on file  . Highest education level: Not on file  Occupational History  . Not on file  Tobacco Use  . Smoking status: Never Smoker  . Smokeless tobacco: Never Used  Substance and Sexual Activity  . Alcohol use: Yes    Comment: Occas-holidays  . Drug use: No  . Sexual activity: Never  Other Topics Concern  . Not on file  Social History Narrative   exercsie at [facility] 4-6 times a week.;  Diet: good;      In [place]; with husband; 2 sons; never smoked; seldom alcohol.    Social Determinants of Health   Financial Resource Strain:   . Difficulty of Paying Living Expenses:   Food Insecurity:   . Worried About Charity fundraiser in the Last Year:   . Arboriculturist in the Last Year:   Transportation Needs:   . Film/video editor (Medical):   Marland Kitchen Lack of Transportation (Non-Medical):   Physical Activity:   . Days of Exercise per Week:   . Minutes of Exercise per Session:   Stress:   . Feeling of Stress :   Social Connections:   . Frequency of Communication with Friends and Family:   . Frequency of Social Gatherings with Friends and Family:   . Attends Religious Services:   . Active Member of Clubs or Organizations:   . Attends Archivist Meetings:   Marland Kitchen Marital Status:      FAMILY HISTORY:  We obtained a detailed, 4-generation family history.  Significant diagnoses are listed below: Family History  Problem Relation Age of Onset  . Colon cancer Maternal Grandfather        70's  . Pancreatic cancer Father        87s.   . Diabetes Brother   . Diabetes Brother   . Thyroid cancer Maternal Aunt 72  . Ovarian cancer Maternal Aunt 82  . Other Maternal Grandmother        complications of childbirth  . Parkinson's disease Maternal Aunt     The patient has two sons who are cancer free.  She has three brothers who are all cancer free.  Her father  died of cancer and her mother died of old age.  The patient's father had pancreatic cancer at 52 and died shortly afterwards.  He had two sisters who were no known to have cancer.  His parents died of reportedly non-cancer related issues.  The patient's mother died at 42.  She had a twin sister who reportedly did not have cancer.  There were two paternal half sisters and a paternal half brother.  One sister had thyroid cancer at 69 and ovarian cancer at 65.  The maternal grandfather died of colon cancer at 72 and the grandmother died of 'milk fever' a couple days after the patient's mother was born.  Ms. Insalaco is unaware of previous family  history of genetic testing for hereditary cancer risks. Patient's maternal ancestors are of [place] descent, and paternal ancestors are of [place] descent. There is no reported Ashkenazi Jewish ancestry. There is no known consanguinity.  GENETIC COUNSELING ASSESSMENT: Ms. Forrey is a 64 y.o. female with a personal and family history of cancer which is somewhat suggestive of a Lynch syndrome and predisposition to cancer given the combination of cancer and the IHC results of the patient's uterine tumor. We, therefore, discussed and recommended the following at today's visit.   DISCUSSION: We discussed that 3% of uterine cancer is hereditary, with most cases associated with Lynch syndrome.  The patient was also identified to have appendiceal cancer, which can be associated with GI cancers and Lynch syndrome, but are also seen in BRCA families.  The patient had uterine cancer at 61 in 2013, prior to universal testing for MSI and IHC.  This was performed on her tumor recently and was found to have PMS2 loss on IHC.  The MSI is pending.  We discussed that tumor testing is a screening test, and is not confirmatory for Lynch syndrome.  However, we do recommend further genetic testing as this would tell us if we need to follow her more closely and if we need to test other  family members.  We discussed that testing is beneficial for several reasons including knowing how to follow individuals after completing their treatment, and understand if other family members could be at risk for cancer and allow them to undergo genetic testing.   We reviewed the characteristics, features and inheritance patterns of hereditary cancer syndromes. We also discussed genetic testing, including the appropriate family members to test, the process of testing, insurance coverage and turn-around-time for results. We discussed the implications of a negative, positive, carrier and/or variant of uncertain significant result. We recommended Ms. Lyles pursue genetic testing for the common hereditary cancer panel gene panel. The Common Hereditary Gene Panel offered by [facility] includes sequencing and/or deletion duplication testing of the following 48 genes: APC, ATM, AXIN2, BARD1, BMPR1A, BRCA1, BRCA2, BRIP1, CDH1, CDK4, CDKN2A (p14ARF), CDKN2A (p16INK4a), CHEK2, CTNNA1, DICER1, EPCAM (Deletion/duplication testing only), GREM1 (promoter region deletion/duplication testing only), KIT, MEN1, MLH1, MSH2, MSH3, MSH6, MUTYH, NBN, NF1, NHTL1, PALB2, PDGFRA, PMS2, POLD1, POLE, PTEN, RAD50, RAD51C, RAD51D, RNF43, SDHB, SDHC, SDHD, SMAD4, SMARCA4. STK11, TP53, TSC1, TSC2, and VHL.  The following genes were evaluated for sequence changes only: SDHA and HOXB13 c.251G>A variant only.   Based on Ms. Dunsmore's personal and family history of cancer, she meets medical criteria for genetic testing. Despite that she meets criteria, she may still have an out of pocket cost. We discussed that if her out of pocket cost for testing is over $100, the laboratory will call and confirm whether she wants to proceed with testing.  If the out of pocket cost of testing is less than $100 she will be billed by the genetic testing laboratory.   PLAN: After considering the risks, benefits, and limitations, Ms. Wisner provided  informed consent to pursue genetic testing.  Blood will be [facility] at [facility] and will be sent to [facility] for analysis of the common hereditary cancer panel. Results should be available within approximately 2-3 weeks' time, at which point they will be disclosed by telephone to Ms. Swiss, as will any additional recommendations warranted by these results. Ms. Rosenau will receive a summary of her genetic counseling visit and a copy of her results once available. This information will also be  available in [facility].   Lastly, we encouraged Ms. Saric to remain in contact with cancer genetics annually so that we can continuously update the family history and inform her of any changes in cancer genetics and testing that may be of benefit for this family.   Ms. Scheidegger questions were answered to her satisfaction today. Our contact information was provided should additional questions or concerns arise. Thank you for the referral and allowing [place] to share in the care of your patient.   Foy Vanduyne P. Florene Glen, [place], [facility] Licensed, Insurance risk surveyor [email or web address] phone: [phone]  The patient was seen for a total of 30 minutes in face-to-face genetic counseling.  This patient was discussed with Drs. Magrinat, Lindi Adie and/or Burr Medico who agrees with the above.    _______________________________________________________________________ For Office Staff:  Number of people involved in session: 1 Was an Intern/ student involved with case: no

## 2019-06-14 ENCOUNTER — Inpatient Hospital Stay: Payer: Medicare HMO

## 2019-06-15 ENCOUNTER — Other Ambulatory Visit: Payer: Self-pay

## 2019-06-15 ENCOUNTER — Inpatient Hospital Stay: Payer: Medicare HMO

## 2019-06-15 DIAGNOSIS — Z8542 Personal history of malignant neoplasm of other parts of uterus: Secondary | ICD-10-CM | POA: Diagnosis not present

## 2019-06-15 DIAGNOSIS — Z8041 Family history of malignant neoplasm of ovary: Secondary | ICD-10-CM | POA: Diagnosis not present

## 2019-06-15 DIAGNOSIS — C181 Malignant neoplasm of appendix: Secondary | ICD-10-CM | POA: Diagnosis not present

## 2019-06-15 DIAGNOSIS — Z800 Family history of malignant neoplasm of digestive organs: Secondary | ICD-10-CM | POA: Diagnosis not present

## 2019-06-28 ENCOUNTER — Other Ambulatory Visit: Payer: Self-pay

## 2019-06-28 ENCOUNTER — Ambulatory Visit (AMBULATORY_SURGERY_CENTER): Payer: Self-pay

## 2019-06-28 VITALS — Temp 98.0°F | Ht 65.5 in | Wt 189.4 lb

## 2019-06-28 DIAGNOSIS — Z800 Family history of malignant neoplasm of digestive organs: Secondary | ICD-10-CM

## 2019-06-28 DIAGNOSIS — C181 Malignant neoplasm of appendix: Secondary | ICD-10-CM

## 2019-06-28 DIAGNOSIS — Z86010 Personal history of colonic polyps: Secondary | ICD-10-CM

## 2019-06-28 MED ORDER — SUTAB 1479-225-188 MG PO TABS: 12.0000 | ORAL_TABLET | ORAL | 0 refills | Status: DC

## 2019-06-28 NOTE — Progress Notes (Signed)
No allergies to soy or egg Pt is not on blood thinners or diet pills Denies issues with sedation/intubation Denies atrial flutter/fib Denies constipation   Emmi instructions given to pt  Pt is aware of Covid safety and care partner requirements.  Recent surgery for appendicitis which was also cancerous.  Dr. Fuller Plan is aware.

## 2019-07-03 ENCOUNTER — Other Ambulatory Visit: Payer: Self-pay | Admitting: Family Medicine

## 2019-07-03 ENCOUNTER — Telehealth: Payer: Self-pay | Admitting: Genetic Counselor

## 2019-07-03 ENCOUNTER — Encounter: Payer: Self-pay | Admitting: Genetic Counselor

## 2019-07-03 DIAGNOSIS — Z1379 Encounter for other screening for genetic and chromosomal anomalies: Secondary | ICD-10-CM | POA: Insufficient documentation

## 2019-07-03 NOTE — Telephone Encounter (Signed)
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  This does not explain her personal history of cancer, but places her at increased risk for breast cancer.  We will discuss further on 5/12/20201.

## 2019-07-04 ENCOUNTER — Ambulatory Visit (HOSPITAL_BASED_OUTPATIENT_CLINIC_OR_DEPARTMENT_OTHER): Payer: Medicare HMO | Admitting: Genetic Counselor

## 2019-07-04 ENCOUNTER — Encounter: Payer: Self-pay | Admitting: Genetic Counselor

## 2019-07-04 DIAGNOSIS — Z8041 Family history of malignant neoplasm of ovary: Secondary | ICD-10-CM

## 2019-07-04 DIAGNOSIS — Z800 Family history of malignant neoplasm of digestive organs: Secondary | ICD-10-CM | POA: Diagnosis not present

## 2019-07-04 DIAGNOSIS — Z808 Family history of malignant neoplasm of other organs or systems: Secondary | ICD-10-CM | POA: Diagnosis not present

## 2019-07-04 DIAGNOSIS — Z1379 Encounter for other screening for genetic and chromosomal anomalies: Secondary | ICD-10-CM

## 2019-07-04 NOTE — Progress Notes (Signed)
GENETIC TEST RESULTS   Patient Name: Melissa [name] Patient Age: 64 y.o. Encounter Date: 07/04/2019  Referring Provider: Dr. Rogue Bussing, MD    Melissa [name] was seen in the [facility] on Jul 04, 2019 due to a personal and family history of cancer and concern regarding a hereditary predisposition to cancer in the family. Please refer to the prior Genetics clinic note for more information regarding Melissa [name]'s medical and family histories and our assessment at the time.   FAMILY HISTORY:  We obtained a detailed, 4-generation family history.  Significant diagnoses are listed below: Family History  Problem Relation Age of Onset  . Colon cancer Maternal Grandfather        70's  . Pancreatic cancer Father        49s.   . Colon polyps Father   . Diabetes Brother   . Diabetes Brother   . Thyroid cancer Maternal Aunt 72  . Ovarian cancer Maternal Aunt 82  . Other Maternal Grandmother        complications of childbirth  . Parkinson's disease Maternal Aunt   . Esophageal cancer Neg Hx   . Rectal cancer Neg Hx   . Stomach cancer Neg Hx     The patient has two sons who are cancer free.  She has three brothers who are all cancer free.  Her father died of cancer and her mother died of old age.  The patient's father had pancreatic cancer at 44 and died shortly afterwards.  He had two sisters who were no known to have cancer.  His parents died of reportedly non-cancer related issues.  The patient's mother died at 74.  She had a twin sister who reportedly did not have cancer.  There were two paternal half sisters and a paternal half brother.  One sister had thyroid cancer at 33 and ovarian cancer at 61.  The maternal grandfather died of colon cancer at 39 and the grandmother died of 'milk fever' a couple days after the patient's mother was born.  Melissa [name] is unaware of previous family history of genetic testing for hereditary cancer risks. Patient's maternal  ancestors are of [place] descent, and paternal ancestors are of [place] descent. There is no reported Ashkenazi Jewish ancestry. There is no known consanguinity.   GENETIC TESTING:  At the time of Melissa [name]'s visit, we recommended she pursue genetic testing of the common hereditary cancer panel and preliminary evidence colorectal cancer panel. 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     DISCUSSION: Background Regarding the NBN gene:  The normal function of the NBN gene is to help protect our bodies from cancer; the NBN works with other genes in a complex and is important in the repair of DNA that becomes damaged during normal DNA replication. We all inherit two copies of the NBN gene, one from each parent. If there is a specific change (mutation) in the NBN gene that stops the gene from working properly, this may inhibit the cell's ability to correct mistakes in the DNA, thereby permitting more mutations to accumulate, which can increase individual's risk to develop cancer in their lifetime.   Interpretation of Results:  Peer-reviewed research suggests that women who have inherited a mutation in the NBN gene may have an increased lifetime risk of developing breast cancer over that of a woman in the general population (general population lifetime risk for breast  cancer is 12.5%), however data is still limited regarding the specific lifetime risks for cancer and the average age of onset of cancer. Clinical genetic testing for mutations in the NBN gene have only been available in the last several years in the context of hereditary cancer risk assessment and more research is needed to clarify the lifetime risks for cancers associated with inherited mutations in this gene.   Different mutations in the NBN gene may confer different lifetime risks for cancer. The available  evidence is based on a limited number of studies and some studies have involved a small number of patients or patients from a specific ethnic group. For example, there is one particular mutation in the NBN gene which is more common to individuals of [place] ancestry (this is the mutation that was identified in Melissa [name]) and some studies suggest this mutation is associated with a 2-3 fold risk for breast cancer (up to 30% lifetime risk) as compared to the general population (12.5%).  However, it is possible that the lifetime risk for breast cancer for women who carry this mutation in the NBN gene who also have a strong family history of early-onset breast cancers is greater than that of a carrier of the same mutation who has little or no family history of such cancers. Thus, other genetic and/or environmental risk factors may modify the NBN gene. Men who have inherited an NBN mutation may be at a slightly increased risk for prostate cancer, but data is not conclusive and there are no current recommendations for increased prostate cancer screening over that of the general population. Mutations in the NBN gene are not known to be associated with colon cancer risk. Further research about the NBN gene is expected to be available in the coming years, so recommendations may evolve.   Recommendations for Cancer Screening:  At this writing, the current national guidelines [employer] (NCCN) guidelines) do not suggest increased screening for breast cancer unless needed based on family history.  However, guidelines in prior years did suggest the following screening for breast surveillance:   Annual mammogram and consider breast MRI with contrast at age 78  Currently, the evidence is insufficient to recommend a risk reducing mastectomy, unless the risk for breast cancer based on family history warrants discussion.   Additionally, there is no evidence to suggest an increased risk for  ovarian cancer.  Men with NBN mutations may be at an increased risk for prostate cancer.  Unfortunately, not all insurance programs will recognize the NCCN guidelines recommendations for breast MRI based on the information known about NBN and that it is a more newly described hereditary breast cancer candidate gene.  However, if a person is calculated to have a greater than 20% lifetime risk to develop breast cancer based on family history information alone or other personal factors (such as certain types of benign breast disease, such as atypical duct hyperplasia (ADH), for example), breast MRI screening may be considered.  in the form of annual breast MRI screening (in addition to annual mammography) for individuals who have a NBN mutation. However, if a person is calculated to have a greater than 20% lifetime risk to develop breast cancer based on family history information alone or other personal factors (such as certain types of benign breast disease, such as atypical duct hyperplasia (ADH), for example), breast MRI screening may be considered. It may be helpful to send Ms. Kohlman to a high risk breast clinic.  These are available at  [facility] or [facility] by making a referral to one of the breast oncologists for high risk counseling.  Based on the Ms. Dimon's personal and family history, statistical models Harriett Rush)  and literature data were used to estimate her risk of developing breast cancer. These estimate her lifetime risk of developing breast cancer to be approximately 5.2%%. The patient's lifetime breast cancer risk is a preliminary estimate based on available information using one of several models endorsed by the [place] (ACS). The ACS recommends consideration of breast MRI screening as an adjunct to mammography for patients at high risk (defined as 20% or greater lifetime risk).  Ms. Oesterreich is not at increased risk for  breast cancer based on the Tyrer Cuzick risk model.     A note on Nijmegan Breakage Syndrome:  The following may not be specifically relevant at this time to Ms. Grantham family but I would be remiss in not including this information in this note. If an individual inherits two mutations in the NBN gene (one from each parent) this can lead to a condition called Nijmegan Breakage Syndrome (NBS), which is a very rare autosomal recessive condition involving a variety of developmental abnormalities including microcephaly (small head size), immune deficiency predisposition to leukemia or lymphoma. When an individual and her/his partner both carry an NBN mutation, their chance to have a child affected with NBS is 25%. For family planning purposes, genetic counseling regarding NBS may be considered for individuals of reproductive age in at-risk families. Of note, Ms. Bastien reported no family history of the conditions associated with NBS.   FAMILY MEMBERS: It is important that all of Ms. Slaugh's relatives (both men and women) know of the presence of this gene mutation, if not for cancer risk assessment, but also for reproductive risks. Site-specific genetic testing can sort out who in the family is at risk and who is not.   Ms. Gregory children and siblings have a 50% chance to have inherited this mutation. We recommend they have genetic testing for this same mutation, as identifying the presence of this mutation would allow them to also take advantage of risk-reducing measures.   Please Keep in Touch: As more individuals undergo genetic testing and more information is gathered on carriers of mutations in the NBN gene, screening and/or recommendations may change. For example, if screening guidelines change to recommend breast MRI screening for all NBN carriers, this may be helpful to Ms. Yeakel with regard to insurance coverage for the cost of this type of breast surveillance. I urged Ms.  Beever to keep in touch so that new management recommendations may be discussed as they become available. It is important to note that this genetic risk assessment is based on Ms. Dumont family and medical history information as provided by her at the time of her visit. With the rapid pace of medical research and improvements in technology, new discoveries may modify this assessment; therefore, I encouraged Ms. Wilbanks to re-contact me periodically for information on advances in the field or if there are any updates, new diagnoses or corrections to Ms. Brundage's family history information.  Our contact number was provided. Ms. Gamache questions were answered to her satisfaction today, and she knows she is welcome to call anytime with additional questions.   Naureen Benton P. Florene Glen, [place], [facility] Licensed, Insurance risk surveyor [email or web address] phone: [phone]  The patient was seen for a total of 30 minutes in face-to-face genetic counseling.

## 2019-07-11 ENCOUNTER — Encounter: Payer: Self-pay | Admitting: Gastroenterology

## 2019-07-19 ENCOUNTER — Encounter: Payer: Self-pay | Admitting: Gastroenterology

## 2019-07-19 ENCOUNTER — Ambulatory Visit (AMBULATORY_SURGERY_CENTER): Payer: Medicare HMO | Admitting: Gastroenterology

## 2019-07-19 ENCOUNTER — Other Ambulatory Visit: Payer: Self-pay

## 2019-07-19 VITALS — BP 112/70 | HR 62 | Temp 97.5°F | Resp 18 | Ht 65.0 in | Wt 189.0 lb

## 2019-07-19 DIAGNOSIS — Z86010 Personal history of colonic polyps: Secondary | ICD-10-CM

## 2019-07-19 DIAGNOSIS — D123 Benign neoplasm of transverse colon: Secondary | ICD-10-CM

## 2019-07-19 MED ORDER — SODIUM CHLORIDE 0.9 % IV SOLN: 500.0000 mL | Freq: Once | INTRAVENOUS | Status: DC

## 2019-07-19 NOTE — Op Note (Addendum)
[facility] Patient Name: Melissa [name] Procedure Date: 07/19/2019 7:58 AM MRN: [id number] Endoscopist: Ladene Artist , MD Age: 64 Referring MD:  Date of Birth: [birth year] Gender: Female Account #: [id number] Procedure:                Colonoscopy Indications:              Surveillance: Personal history of adenomatous                            polyps on last colonoscopy 3 years ago. Personal                            history of LAMN. Medicines:                Monitored Anesthesia Care Procedure:                Pre-Anesthesia Assessment:                           - Prior to the procedure, a History and Physical                            was performed, and patient medications and                            allergies were reviewed. The patient's tolerance of                            previous anesthesia was also reviewed. The risks                            and benefits of the procedure and the sedation                            options and risks were discussed with the patient.                            All questions were answered, and informed consent                            was obtained. Prior Anticoagulants: The patient has                            taken no previous anticoagulant or antiplatelet                            agents. ASA Grade Assessment: III - A patient with                            severe systemic disease. After reviewing the risks                            and benefits, the patient was deemed in  satisfactory condition to undergo the procedure.                           After obtaining informed consent, the colonoscope                            was passed under direct vision. Throughout the                            procedure, the patient's blood pressure, pulse, and                            oxygen saturations were monitored continuously. The                            Colonoscope was introduced through the  anus and                            advanced to the the cecum, identified by                            appendiceal orifice and ileocecal valve. The                            ileocecal valve, appendiceal orifice, and rectum                            were photographed. The quality of the bowel                            preparation was good. The colonoscopy was performed                            without difficulty. The patient tolerated the                            procedure well. Scope In: 8:11:52 AM Scope Out: 8:30:23 AM Scope Withdrawal Time: 0 hours 12 minutes 31 seconds  Total Procedure Duration: 0 hours 18 minutes 31 seconds  Findings:                 The perianal and digital rectal examinations were                            normal.                           A 7 mm polyp was found in the transverse colon. The                            polyp was sessile. The polyp was removed with a                            cold snare. Resection and retrieval were complete.  External and internal hemorrhoids were found during                            retroflexion. The hemorrhoids were small and Grade                            I (internal hemorrhoids that do not prolapse).                           The exam was otherwise without abnormality on                            direct and retroflexion views. Complications:            No immediate complications. Estimated blood loss:                            None. Estimated Blood Loss:     Estimated blood loss: none. Impression:               - One 7 mm polyp in the transverse colon, removed                            with a cold snare. Resected and retrieved.                           - External and internal hemorrhoids.                           - The examination was otherwise normal on direct                            and retroflexion views. Recommendation:           - Repeat colonoscopy, likely in 3 years, after                             studies are complete for surveillance based on                            pathology results with an extended bowel prep.                           - Patient has a contact number available for                            emergencies. The signs and symptoms of potential                            delayed complications were discussed with the                            patient. Return to normal activities tomorrow.                            Written  discharge instructions were provided to the                            patient.                           - Resume previous diet.                           - Continue present medications.                           - Await pathology results. Ladene Artist, MD 07/19/2019 8:34:14 AM This report has been signed electronically.

## 2019-07-19 NOTE — Progress Notes (Signed)
Pt's states no medical or surgical changes since previsit or office visit. 

## 2019-07-19 NOTE — Patient Instructions (Signed)
YOU HAD AN ENDOSCOPIC PROCEDURE TODAY AT THE [facility]:   Refer to the procedure report that was given to you for any specific questions about what was found during the examination.  If the procedure report does not answer your questions, please call your gastroenterologist to clarify.  If you requested that your care partner not be given the details of your procedure findings, then the procedure report has been included in a sealed envelope for you to review at your convenience later.  YOU SHOULD EXPECT: Some feelings of bloating in the abdomen. Passage of more gas than usual.  Walking can help get rid of the air that was put into your GI tract during the procedure and reduce the bloating. If you had a lower endoscopy (such as a colonoscopy or flexible sigmoidoscopy) you may notice spotting of blood in your stool or on the toilet paper. If you underwent a bowel prep for your procedure, you may not have a normal bowel movement for a few days.  Please Note:  You might notice some irritation and congestion in your nose or some drainage.  This is from the oxygen used during your procedure.  There is no need for concern and it should clear up in a day or so.  SYMPTOMS TO REPORT IMMEDIATELY:   Following lower endoscopy (colonoscopy or flexible sigmoidoscopy):  Excessive amounts of blood in the stool  Significant tenderness or worsening of abdominal pains  Swelling of the abdomen that is new, acute  Fever of 100F or higher  For urgent or emergent issues, a gastroenterologist can be reached at any hour by calling [phone]. Do not use MyChart messaging for urgent concerns.    DIET:  We do recommend a small meal at first, but then you may proceed to your regular diet.  Drink plenty of fluids but you should avoid alcoholic beverages for 24 hours.  ACTIVITY:  You should plan to take it easy for the rest of today and you should NOT DRIVE or use heavy machinery until tomorrow (because  of the sedation medicines used during the test).    FOLLOW UP: Our staff will call the number listed on your records 48-72 hours following your procedure to check on you and address any questions or concerns that you may have regarding the information given to you following your procedure. If we do not reach you, we will leave a message.  We will attempt to reach you two times.  During this call, we will ask if you have developed any symptoms of COVID 19. If you develop any symptoms (ie: fever, flu-like symptoms, shortness of breath, cough etc.) before then, please call [phone].  If you test positive for Covid 19 in the 2 weeks post procedure, please call and report this information to us.    If any biopsies were taken you will be contacted by phone or by letter within the next 1-3 weeks.  Please call us at [phone] if you have not heard about the biopsies in 3 weeks.    SIGNATURES/CONFIDENTIALITY: You and/or your care partner have signed paperwork which will be entered into your electronic medical record.  These signatures attest to the fact that that the information above on your After Visit Summary has been reviewed and is understood.  Full responsibility of the confidentiality of this discharge information lies with you and/or your care-partner. 

## 2019-07-19 NOTE — Progress Notes (Signed)
pt tolerated well. VSS. awake and to recovery. Report given to RN.  

## 2019-07-20 ENCOUNTER — Ambulatory Visit
Admission: RE | Admit: 2019-07-20 | Discharge: 2019-07-20 | Disposition: A | Discharge status: Home / Self Care | Payer: Medicare HMO | Source: Ambulatory Visit | Attending: Family Medicine | Admitting: Family Medicine

## 2019-07-20 DIAGNOSIS — Z1231 Encounter for screening mammogram for malignant neoplasm of breast: Secondary | ICD-10-CM | POA: Diagnosis not present

## 2019-07-24 ENCOUNTER — Telehealth: Payer: Self-pay

## 2019-07-24 NOTE — Progress Notes (Signed)
No critical labs need to be addressed urgently. We will discuss labs in detail at upcoming office visit.   

## 2019-07-24 NOTE — Telephone Encounter (Signed)
  Follow up Call-  Call back number 07/19/2019 02/08/2017  Post procedure Call Back phone  # [phone]  Permission to leave phone message Yes Yes  Some recent data might be hidden     Patient questions:  Do you have a fever, pain , or abdominal swelling? No. Pain Score  0 *  Have you tolerated food without any problems? Yes.    Have you been able to return to your normal activities? Yes.    Do you have any questions about your discharge instructions: Diet   No. Medications  No. Follow up visit  No.  Do you have questions or concerns about your Care? No.  Actions: * If pain score is 4 or above: No action needed, pain <4.  1. Have you developed a fever since your procedure? no  2.   Have you had an respiratory symptoms (SOB or cough) since your procedure? no  3.   Have you tested positive for COVID 19 since your procedure no  4.   Have you had any family members/close contacts diagnosed with the COVID 19 since your procedure?  no   If yes to any of these questions please route to Joylene John, RN and Erenest Rasher, RN

## 2019-07-25 DIAGNOSIS — C55 Malignant neoplasm of uterus, part unspecified: Secondary | ICD-10-CM | POA: Diagnosis not present

## 2019-08-01 DIAGNOSIS — H52213 Irregular astigmatism, bilateral: Secondary | ICD-10-CM | POA: Diagnosis not present

## 2019-08-13 ENCOUNTER — Encounter: Payer: Self-pay | Admitting: Gastroenterology

## 2019-09-18 DIAGNOSIS — R69 Illness, unspecified: Secondary | ICD-10-CM | POA: Diagnosis not present

## 2019-10-17 ENCOUNTER — Other Ambulatory Visit: Payer: Medicare HMO

## 2019-10-17 ENCOUNTER — Other Ambulatory Visit: Payer: Self-pay | Admitting: Radiology

## 2019-10-17 DIAGNOSIS — Z20822 Contact with and (suspected) exposure to covid-19: Secondary | ICD-10-CM | POA: Diagnosis not present

## 2019-10-19 LAB — SARS-COV-2, NAA 2 DAY TAT

## 2019-10-19 LAB — NOVEL CORONAVIRUS, NAA: SARS-CoV-2, NAA: NOT DETECTED

## 2019-11-08 ENCOUNTER — Ambulatory Visit (INDEPENDENT_AMBULATORY_CARE_PROVIDER_SITE_OTHER): Payer: Medicare HMO

## 2019-11-08 ENCOUNTER — Other Ambulatory Visit: Payer: Self-pay

## 2019-11-08 DIAGNOSIS — Z23 Encounter for immunization: Secondary | ICD-10-CM | POA: Diagnosis not present

## 2019-11-23 ENCOUNTER — Other Ambulatory Visit: Payer: Self-pay | Admitting: Family Medicine

## 2019-12-03 ENCOUNTER — Telehealth: Payer: Self-pay | Admitting: Family Medicine

## 2019-12-03 DIAGNOSIS — E7800 Pure hypercholesterolemia, unspecified: Secondary | ICD-10-CM

## 2019-12-03 DIAGNOSIS — M816 Localized osteoporosis [Lequesne]: Secondary | ICD-10-CM

## 2019-12-03 NOTE — Telephone Encounter (Signed)
-----   Message from Ellamae Sia sent at 11/20/2019  3:02 PM EDT ----- Regarding: Lab orders for Tuesday, 10.11.21 Patient is scheduled for CPX labs, please order future labs, Thanks , Karna Christmas

## 2019-12-04 ENCOUNTER — Other Ambulatory Visit (INDEPENDENT_AMBULATORY_CARE_PROVIDER_SITE_OTHER): Payer: Medicare HMO

## 2019-12-04 ENCOUNTER — Other Ambulatory Visit: Payer: Self-pay

## 2019-12-04 DIAGNOSIS — M816 Localized osteoporosis [Lequesne]: Secondary | ICD-10-CM | POA: Diagnosis not present

## 2019-12-04 DIAGNOSIS — E7800 Pure hypercholesterolemia, unspecified: Secondary | ICD-10-CM

## 2019-12-04 LAB — COMPREHENSIVE METABOLIC PANEL
ALT: 13 U/L (ref 0–35)
AST: 16 U/L (ref 0–37)
Albumin: 4.4 g/dL (ref 3.5–5.2)
Alkaline Phosphatase: 61 U/L (ref 39–117)
BUN: 12 mg/dL (ref 6–23)
CO2: 29 mEq/L (ref 19–32)
Calcium: 9.3 mg/dL (ref 8.4–10.5)
Chloride: 100 mEq/L (ref 96–112)
Creatinine, Ser: 0.7 mg/dL (ref 0.40–1.20)
GFR: 90.22 mL/min (ref >60.00)
Glucose, Bld: 85 mg/dL (ref 70–99)
Potassium: 4.5 mEq/L (ref 3.5–5.1)
Sodium: 136 mEq/L (ref 135–145)
Total Bilirubin: 0.5 mg/dL (ref 0.2–1.2)
Total Protein: 7 g/dL (ref 6.0–8.3)

## 2019-12-04 LAB — LIPID PANEL
Cholesterol: 166 mg/dL (ref 0–200)
HDL: 43 mg/dL (ref >39.00)
LDL Cholesterol: 107 mg/dL — ABNORMAL HIGH (ref 0–99)
NonHDL: 123.46
Total CHOL/HDL Ratio: 4
Triglycerides: 81 mg/dL (ref 0.0–149.0)
VLDL: 16.2 mg/dL (ref 0.0–40.0)

## 2019-12-04 LAB — VITAMIN D 25 HYDROXY (VIT D DEFICIENCY, FRACTURES): VITD: 38.49 ng/mL (ref 30.00–100.00)

## 2019-12-04 NOTE — Progress Notes (Signed)
No critical labs need to be addressed urgently. We will discuss labs in detail at upcoming office visit.   

## 2019-12-06 ENCOUNTER — Ambulatory Visit: Payer: Self-pay | Admitting: Genetic Counselor

## 2019-12-06 ENCOUNTER — Telehealth: Payer: Self-pay | Admitting: Genetic Counselor

## 2019-12-06 DIAGNOSIS — Z1379 Encounter for other screening for genetic and chromosomal anomalies: Secondary | ICD-10-CM

## 2019-12-06 NOTE — Progress Notes (Addendum)
HPI:  Melissa [name] was previously seen in the [facility] due to a personal and family history of cancer and concerns regarding a hereditary predisposition to cancer. Please refer to our prior cancer genetics clinic note for more information regarding our discussion, assessment and recommendations, at the time. Melissa [name] recent genetic test results were disclosed to her, as were recommendations warranted by these results. These results and recommendations are discussed in more detail below.  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 [[facility]];chemo-RT- Brachy [[facility]; Dr.Gehrog]  # LAMN- appendeixa Procedure: Appendectomy  Tumor site: Proximal half       Base of appendix uninvolved by tumor  Tumor size: 1.7 cm in greatest dimension  Histologic type: Low-grade appendiceal mucinous neoplasm  Histologic grade: Well-differentiated (G1)  Tumor Extension: Acellular mucin invades subserosa or mesoappendix but  does not extend to  the serosal surface  Proximal margin: Uninvolved by low-grade appendiceal mucinous neoplasm  Mesenteric margin: Uninvolved by low-grade appendiceal mucinous neoplasm  Lymphovascular invasion: Not identified  Lymph nodes: No lymph nodes submitted or found  Pathologic Stage Classification (pTNM, AJCC 8th ed): pT3 pNX    History of endometrial cancer  05/08/2010 Initial Diagnosis   Cancer of uterus, IIIC1    - 12/01/2010 Chemotherapy   completed chemo therapy with 6 cycles of paclitaxel and carboplatin and vaginal cuff brachytherapy on GOG 258   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  The Common and Colorectal cancer panel offered by [facility] includes sequencing and/or deletion duplication testing of the  following 56 genes: APC*, ATM*, AXIN2, BARD1, BLM, BMPR1A, BRCA1, BRCA2, BRIP1, BUB1B, CDH1, CDK4, CDKN2A (p14ARF), CDKN2A (p16INK4a), CEP57*, CHEK2, CTNNA1, DICER1*, ENG*, EPCAM*, FLCN, GALNT12, GREM1*, HOXB13, KIT, MEN1*, MLH1*, MLH3*, MSH2*, MSH3*, MSH6*, MUTYH, NBN, NF1*, NTHL1, PALB2, PDGFRA, PMS2*, POLD1*, POLE, PTEN*, RAD50, RAD51C, RAD51D, RNF43, RPS20, SDHA*, SDHB, SDHC*, SDHD, SMAD4, SMARCA4, STK11, TP53, TSC1*, TSC2, and VHL. The report date is Jun 29, 2019. 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     FAMILY HISTORY:  We obtained a detailed, 4-generation family history.  Significant diagnoses are listed below: Family History  Problem Relation Age of Onset  . Colon cancer Maternal Grandfather        70's  . Pancreatic cancer Father        89s.   . Colon polyps Father   . Diabetes Brother   . Diabetes Brother   . Thyroid cancer Maternal Aunt 72  . Ovarian cancer Maternal Aunt 82  . Other Maternal Grandmother        complications of childbirth  . Parkinson's disease Maternal Aunt   . Esophageal cancer Neg Hx   . Rectal cancer Neg Hx   . Stomach cancer Neg Hx     The patient has two sons who are cancer free. She has three brothers who are all cancer free. Her father died of cancer and her mother died of old age.  The patient's father had pancreatic cancer at 30 and died shortly afterwards. He had two sisters who were no  known to have cancer. His parents died of reportedly non-cancer related issues.  The patient's mother died at 34. She had a twin sister who reportedly did not have cancer. There were two paternal half sisters and a paternal half brother. One sister had thyroid cancer at 4 and ovarian  cancer at 49. The maternal grandfather died of colon cancer at 57 and the grandmother died of 'milk fever' a couple days after the patient's mother was born.  Melissa [name] unawareof previous family history of genetic testing for hereditary cancer risks. Patient's maternal ancestors are of [place], and paternal ancestors are of [place]. There is noreported Ashkenazi Jewish ancestry. There is noknown consanguinity.   GENETIC TEST RESULTS: Amended genetic testing reported out on November 21, 2019 through the custom cancer panel found one pathogenic mutations associated with autosomal recessive NBN-related conditions, but NO pathogenic mutations associated with autosomal dominant cancer syndromes.. The Common and Colorectal cancer panel offered by [facility] includes sequencing and/or deletion duplication testing of the following 56 genes: APC*, ATM*, AXIN2, BARD1, BLM, BMPR1A, BRCA1, BRCA2, BRIP1, BUB1B, CDH1, CDK4, CDKN2A (p14ARF), CDKN2A (p16INK4a), CEP57*, CHEK2, CTNNA1, DICER1*, ENG*, EPCAM*, FLCN, GALNT12, GREM1*, HOXB13, KIT, MEN1*, MLH1*, MLH3*, MSH2*, MSH3*, MSH6*, MUTYH, NBN, NF1*, NTHL1, PALB2, PDGFRA, PMS2*, POLD1*, POLE, PTEN*, RAD50, RAD51C, RAD51D, RNF43, RPS20, SDHA*, SDHB, SDHC*, SDHD, SMAD4, SMARCA4, STK11, TP53, TSC1*, TSC2, and VHL. The report date is Jun 29, 2019.. The test report has been scanned into EPIC and is located under the Molecular Pathology section of the Results Review tab.  A portion of the result report is included below for reference.     We discussed with Melissa [name] that because current genetic testing is not perfect, it is possible there may be a gene mutation in one of these genes that current testing cannot detect, but that chance is small.  We also discussed, that there could be another gene that has not yet been discovered, or that we have not yet tested, that is responsible for the cancer diagnoses in the family. It is also possible there is a  hereditary cause for the cancer in the family that Melissa [name] did not inherit and therefore was not identified in her testing.  Therefore, it is important to remain in touch with cancer genetics in the future so that we can continue to offer Melissa [name] the most up to date genetic testing.   INHERITANCE: Individuals with a pathogenic variant in NBN are also carriers of Nijmegen breakage syndrome (NBS). NBS is an autosomal recessive condition that results when an individual inherits a pathogenic NBN variant from each parent. It is a progressive multisystemic disorder that characterized by physical abnormalities, immunodeficiency, increased cancer risk, and cognitive impairment (PMID: 12458099). Physical abnormalities may include progressive microcephaly, early growth retardation leading to short stature, and distinctive facial features. Immunodeficiency often results in recurrent pulmonary infections (PMID: 83382505). B-cell lymphomas are the most common malignancy, although T-cell lymphomas, medulloblastomas, gliomas, and rhabdomyosarcomas may also occur (PMID: 39767341). The offspring can only develop NBS if both parents have a pathogenic variant in NBN; in this case, the risk of having an affected child is 25%.  ADDITIONAL GENETIC TESTING: We discussed with Melissa [name] that her genetic testing was fairly extensive.  If there are genes identified to increase cancer risk that can be analyzed in the future, we would be happy to discuss and coordinate this testing at that time.    CANCER SCREENING RECOMMENDATIONS: Melissa [name] test result is considered negative (normal).  This means  that we have not identified a hereditary cause for her personal and family history of cancer at this time. Most cancers happen by chance and this negative test suggests that her cancer may fall into this category.    While reassuring, this does not definitively rule out a hereditary predisposition to cancer. It is  still possible that there could be genetic mutations that are undetectable by current technology. There could be genetic mutations in genes that have not been tested or identified to increase cancer risk.  Therefore, it is recommended she continue to follow the cancer management and screening guidelines provided by her oncology and primary healthcare provider.   An individual's cancer risk and medical management are not determined by genetic test results alone. Overall cancer risk assessment incorporates additional factors, including personal medical history, family history, and any available genetic information that may result in a personalized plan for cancer prevention and surveillance  RECOMMENDATIONS FOR FAMILY MEMBERS: Knowing if an NBN pathogenic variant is present is advantageous. Melissa [name] children and siblings have a 50% chance to have inherited this mutation. We recommend they have genetic testing for this same mutation, as identifying the presence of this mutation would allow them to understand their risks for subsequent generations to have an NBN-related condition.    Individuals in this family might be at some increased risk of developing cancer, over the general population risk, simply due to the family history of cancer.  We recommended women in this family have a yearly mammogram beginning at age 74, or 35 years younger than the earliest onset of cancer, an annual clinical breast exam, and perform monthly breast self-exams. Women in this family should also have a gynecological exam as recommended by their primary provider. All family members should be referred for colonoscopy starting at age 40.  FOLLOW-UP: Lastly, we discussed with Melissa [name] that cancer genetics is a rapidly advancing field and it is possible that new genetic tests will be appropriate for her and/or her family members in the future. We encouraged her to remain in contact with cancer genetics on an annual basis  so we can update her personal and family histories and let her know of advances in cancer genetics that may benefit this family.   Our contact number was provided. Melissa [name] questions were answered to her satisfaction, and she knows she is welcome to call us at anytime with additional questions or concerns.   Melissa [name], Melissa [name], Melissa [name] Licensed, Certified Genetic Counselor [email or web address]

## 2019-12-06 NOTE — Telephone Encounter (Signed)
Updated patient on the amended NBN report from [facility]. Re-review of the available evidence indicates that the NBN variant is associated with AR NBN-related conditions, but is not definitely known to cause AD NBN-related conditions, such as breast cancer.

## 2019-12-11 ENCOUNTER — Ambulatory Visit (INDEPENDENT_AMBULATORY_CARE_PROVIDER_SITE_OTHER): Payer: Medicare HMO | Admitting: Family Medicine

## 2019-12-11 ENCOUNTER — Encounter: Payer: Self-pay | Admitting: Family Medicine

## 2019-12-11 ENCOUNTER — Other Ambulatory Visit: Payer: Self-pay

## 2019-12-11 VITALS — BP 140/88 | HR 63 | Temp 97.7°F | Ht 65.0 in | Wt 187.5 lb

## 2019-12-11 DIAGNOSIS — Z0000 Encounter for general adult medical examination without abnormal findings: Secondary | ICD-10-CM | POA: Diagnosis not present

## 2019-12-11 DIAGNOSIS — C181 Malignant neoplasm of appendix: Secondary | ICD-10-CM | POA: Diagnosis not present

## 2019-12-11 DIAGNOSIS — I10 Essential (primary) hypertension: Secondary | ICD-10-CM

## 2019-12-11 DIAGNOSIS — E7800 Pure hypercholesterolemia, unspecified: Secondary | ICD-10-CM | POA: Diagnosis not present

## 2019-12-11 DIAGNOSIS — Z23 Encounter for immunization: Secondary | ICD-10-CM

## 2019-12-11 NOTE — Progress Notes (Signed)
Chief Complaint  Patient presents with  . Annual Exam    History of Present Illness: HPI   The patient presents for annual medicare wellness, complete physical and review of chronic health problems. He/She also has the following acute concerns today:  She has sore throat, scratchy, post nasal drip. No fever, no cough.  Has tried [facility] Claritin D.  Ongoing  1 month. No sick contact, S/P 3 doses of COVID vaccine.  I have personally reviewed the Medicare Annual Wellness questionnaire and have noted 1. The patient's medical and social history 2. Their use of alcohol, tobacco or illicit drugs 3. Their current medications and supplements 4. The patient's functional ability including ADL's, fall risks, home safety risks and hearing or visual             impairment. 5. Diet and physical activities 6. Evidence for depression or mood disorders 7.         Updated provider list Cognitive evaluation was performed and recorded on pt medicare questionnaire form. The patients weight, height, BMI and visual acuity have been recorded in the chart  I have made referrals, counseling and provided education to the patient based review of the above and I have provided the pt with a written personalized care plan for preventive services.   Documentation of this information was scanned into the electronic record under the media tab.  Advance directives and end of life planning reviewed in detail with patient and documented in EMR. Patient given handout on advance care directives if needed. HCPOA and living will updated if needed.    Office Visit from 12/11/2019 in [place] [facility]  PHQ-2 Total Score 2      Fall Risk  12/11/2019 12/08/2018  Falls in the past year? 0 0  Number falls in past yr: 0 -  Injury with Fall? 0 -  Follow up Falls evaluation completed -    Hypertension:   Usually well controlled on ramipril BP Readings from Last 3 Encounters:  12/11/19 140/88  07/19/19  112/70  06/05/19 (!) 145/86  Using medication without problems or lightheadedness:  none Chest pain with exertion: none Edema:none Short of breath: none Average home BPs: not checking Other issues:   Elevated Cholesterol:  Not at goal but better than last year.. now in range for statin to be recommended. She is not currently interested in statin. Lab Results  Component Value Date   CHOL 166 12/04/2019   HDL 43.00 12/04/2019   LDLCALC 107 (H) 12/04/2019   TRIG 81.0 12/04/2019   CHOLHDL 4 12/04/2019  Using medications without problems: Muscle aches:  Diet compliance: improving Exercise: 4-5 times a week.Marland Kitchen going to [facility] Other complaints: The 10-year ASCVD risk score Mikey Bussing DC Brooke Bonito., et al., 2013) is: 9.9%   Values used to calculate the score:     Age: 64 years     Sex: Female     Is Non-Hispanic African American: No     Diabetic: No     Tobacco smoker: No     Systolic Blood Pressure: 364 mmHg     Is BP treated: Yes     HDL Cholesterol: 43 mg/dL     Total Cholesterol: 166 mg/dL   Vit D: resolved.   Hx of endometrial cancer  Wt Readings from Last 3 Encounters:  12/11/19 187 lb 8 oz (85 kg)  07/19/19 189 lb (85.7 kg)  06/28/19 189 lb 6.4 oz (85.9 kg)  Body mass index is 31.2 kg/m.  Patient Care Team: Jinny Sanders, MD as PCP - General (Family Medicine) Nancy Marus, MD as Consulting Physician (Gynecologic Oncology) Vickie Epley, MD (Inactive) (General Surgery) Ladene Artist, MD as Consulting Physician (Gastroenterology) Clent Jacks, RN as Oncology Nurse Navigator  This visit occurred during the SARS-CoV-2 public health emergency.  Safety protocols were in place, including screening questions prior to the visit, additional usage of staff PPE, and extensive cleaning of exam room while observing appropriate contact time as indicated for disinfecting solutions.   COVID 19 screen:  No recent travel or known exposure to COVID19 The patient denies  respiratory symptoms of COVID 19 at this time. The importance of social distancing was discussed today.     Review of Systems  Constitutional: Negative for chills and fever.  HENT: Negative for congestion and ear pain.   Eyes: Negative for pain and redness.  Respiratory: Negative for cough and shortness of breath.   Cardiovascular: Negative for chest pain, palpitations and leg swelling.  Gastrointestinal: Negative for abdominal pain, blood in stool, constipation, diarrhea, nausea and vomiting.  Genitourinary: Negative for dysuria.  Musculoskeletal: Negative for falls and myalgias.  Skin: Negative for rash.  Neurological: Negative for dizziness.  Psychiatric/Behavioral: Negative for depression. The patient is not nervous/anxious.       Past Medical History:  Diagnosis Date  . Allergy    seasonal  . 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 Chronic cholecystitis   . Family history of colon cancer   . Family history of ovarian cancer   . Family history of pancreatic cancer   . Hypertension   . Osteoarthritis    HX OF SOME  OSTEOARTHRITIS    reports that she has never smoked. She has never used smokeless tobacco. She reports current alcohol use. She reports that she does not use drugs.   Current Outpatient Medications:  .  alendronate (FOSAMAX) 70 MG tablet, TAKE 1 TABLET BY MOUTH EVERY 7 (SEVEN) DAYS. TAKE WITH A FULL GLASS OF WATER ON AN EMPTY STOMACH., Disp: 12 tablet, Rfl: 1 .  bisacodyl (DULCOLAX) 5 MG EC tablet, Take 5 mg by mouth daily as needed for moderate constipation., Disp: , Rfl:  .  calcium carbonate (TUMS - DOSED IN MG ELEMENTAL CALCIUM) 500 MG chewable tablet, Chew 1 tablet by mouth 2 (two) times daily. , Disp: , Rfl:  .  cholecalciferol (VITAMIN D) 1000 units tablet, Take 1,000 Units by mouth daily., Disp: , Rfl:  .  diphenhydrAMINE HCl (BENADRYL PO), Take by mouth., Disp: , Rfl:  .  docusate sodium  (COLACE) 100 MG capsule, Take 100 mg by mouth daily as needed for mild constipation., Disp: , Rfl:  .  glucosamine-chondroitin 500-400 MG tablet, Take 2 tablets by mouth daily., Disp: , Rfl:  .  meloxicam (MOBIC) 15 MG tablet, TAKE 1 TABLET BY MOUTH EVERY DAY AS NEEDED FOR PAIN (Patient taking differently: Take 15 mg by mouth daily as needed for pain. ), Disp: 90 tablet, Rfl: 0 .  ramipril (ALTACE) 5 MG capsule, TAKE 1 CAPSULE BY MOUTH EVERY MORNING, Disp: 90 capsule, Rfl: 0   Observations/Objective: Blood pressure 140/88, pulse 63, temperature 97.7 F (36.5 C), height 5\' 5"  (1.651 m), weight 187 lb 8 oz (85 kg), SpO2 99 %.  Physical Exam Constitutional:      General: She is not in acute distress.Vital signs are normal.     Appearance: Normal appearance. She is well-developed  and well-nourished. She is not ill-appearing or toxic-appearing.  HENT:     Head: Normocephalic.     Right Ear: Hearing, tympanic membrane, ear canal and external ear normal.     Left Ear: Hearing, tympanic membrane, ear canal and external ear normal.     Nose: Nose normal.  Eyes:     General: Lids are normal. Lids are everted, no foreign bodies appreciated.     Extraocular Movements: EOM normal.     Conjunctiva/sclera: Conjunctivae normal.     Pupils: Pupils are equal, round, and reactive to light.  Neck:     Thyroid: No thyroid mass or thyromegaly.     Vascular: No carotid bruit.     Trachea: Trachea normal.  Cardiovascular:     Rate and Rhythm: Normal rate and regular rhythm.     Pulses: Intact distal pulses.     Heart sounds: Normal heart sounds, S1 normal and S2 normal. No murmur heard. No gallop.   Pulmonary:     Effort: Pulmonary effort is normal. No respiratory distress.     Breath sounds: Normal breath sounds. No wheezing, rhonchi or rales.  Abdominal:     General: Bowel sounds are normal. There is no distension or abdominal bruit.     Palpations: Abdomen is soft. There is no fluid wave,  hepatosplenomegaly or mass.     Tenderness: There is no abdominal tenderness. There is no CVA tenderness, guarding or rebound.     Hernia: No hernia is present.  Musculoskeletal:     Cervical back: Normal range of motion and neck supple.  Lymphadenopathy:     Cervical: No cervical adenopathy.     Upper Body:  No axillary adenopathy present. Skin:    General: Skin is warm, dry and intact.     Findings: No rash.  Neurological:     Mental Status: She is alert.     Cranial Nerves: No cranial nerve deficit.     Sensory: No sensory deficit.     Deep Tendon Reflexes: Strength normal.  Psychiatric:        Mood and Affect: Mood is not anxious or depressed.        Speech: Speech normal.        Behavior: Behavior normal. Behavior is cooperative.        Cognition and Memory: Cognition and memory normal.        Judgment: Judgment normal.      Assessment and Plan   The patient's preventative maintenance and recommended screening tests for an annual wellness exam were reviewed in full today. Brought up to date unless services declined.  Counselled on the importance of diet, exercise, and its role in overall health and mortality. The patient's FH and SH was reviewed, including their home life, tobacco status, and drug and alcohol status.   PAP/GYN: hx of endometrial cancer, s/s total abdominal hysterectomy. Need DVE yearly per pt.. No pap recommended. No vaginal issues, no lower abdominal pain. Vaccines: due for Td, uptodate  flu... given  PNA23 today.Colon: Dr. Fuller Plan. Nml 2013,Repeat2018.... recomemend repeat 3 years Mammogram: 06/2019 3D mammo nml... heterogenously dense breasts.Marland Kitchen  DEXA: mother with osteoporosis, no other risk factors.. 2016 osteopenia  2020 osteoporosis started on fosamax. Repeat in 2022 Nonsmoker Hep C screen:done SFK:CLEX  Colon:  06/2019  Plan repeat in 3 years Dr. Fuller Plan.  Problem List Items Addressed This Visit    Essential hypertension, benign     Stable, chronic.  Continue current medication.  High cholesterol     Not at goal but better than last year.. now in range for statin to be recommended. She is not currently interested in statin. Encouraged exercise, weight loss, healthy eating habits.       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 per ONc and surgery.  Normal colonoscopy 2021.. repeat q 3years.       Other Visit Diagnoses    Medicare annual wellness visit, subsequent    -  Primary   Need for 23-polyvalent pneumococcal polysaccharide vaccine       Relevant Orders   Pneumococcal polysaccharide vaccine 23-valent greater than or equal to 2yo subcutaneous/IM (Completed)      Eliezer Lofts, MD

## 2019-12-11 NOTE — Assessment & Plan Note (Signed)
Treatment appendectomy.. per ONc and surgery.  Normal colonoscopy 2021.. repeat q 3years.

## 2019-12-11 NOTE — Patient Instructions (Addendum)
Consider re-check cholesterol in 3-6 months on improving lifestyle cahnges, low cholesterol diet.  Consider statin medication if LDL is not improving.  Can try flonase 2 sprays per nostril daily. If not helping consider Xyzal or zyrtec for allergies.

## 2019-12-12 ENCOUNTER — Other Ambulatory Visit: Payer: Self-pay | Admitting: Family Medicine

## 2019-12-25 DIAGNOSIS — R69 Illness, unspecified: Secondary | ICD-10-CM | POA: Diagnosis not present

## 2020-01-30 ENCOUNTER — Encounter: Payer: Self-pay | Admitting: Emergency Medicine

## 2020-01-30 ENCOUNTER — Ambulatory Visit
Admission: EM | Admit: 2020-01-30 | Discharge: 2020-01-30 | Disposition: A | Discharge status: Home / Self Care | Payer: Medicare HMO | Attending: Family Medicine | Admitting: Family Medicine

## 2020-01-30 DIAGNOSIS — J0100 Acute maxillary sinusitis, unspecified: Secondary | ICD-10-CM | POA: Diagnosis not present

## 2020-01-30 DIAGNOSIS — R059 Cough, unspecified: Secondary | ICD-10-CM

## 2020-01-30 MED ORDER — AMOXICILLIN-POT CLAVULANATE 875-125 MG PO TABS: 1.0000 | ORAL_TABLET | Freq: Two times a day (BID) | ORAL | 0 refills | Status: DC

## 2020-01-30 NOTE — ED Triage Notes (Signed)
Since Friday pt has been having sore throat and cough with no fevers to note. Drainage is yellow/gren color.

## 2020-01-30 NOTE — Discharge Instructions (Signed)
Antibiotics as prescribed.  Recommend Mucinex cough, congestion and sinus. Drink plenty of water to stay hydrated. Follow up as needed for continued or worsening symptoms

## 2020-01-30 NOTE — ED Provider Notes (Signed)
Melissa [name]    CSN: [id number] Arrival date & time: 01/30/20  1035      History   Chief Complaint Chief Complaint  Patient presents with  . URI    HPI Melissa [name] is a 64 y.o. female.   Patient is a 64 year old female who presents today with worsening sore throat, cough, nasal congestion, sinus pressure over the past 6 days.  Reporting the drainage is yellow/green in color.  Has been using over-the-counter sinus medication without much relief.  No fevers, chills, body aches.  No chest pain or shortness of breath.  No concern for Covid at this time. She plans to travel out of town this weekend.      Past Medical History:  Diagnosis Date  . Allergy    seasonal  . 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 Chronic cholecystitis   . Family history of colon cancer   . Family history of ovarian cancer   . Family history of pancreatic cancer   . Hypertension   . Osteoarthritis    HX OF SOME  OSTEOARTHRITIS    Patient Active Problem List   Diagnosis Date Noted  . Genetic testing 07/03/2019  . Family history of pancreatic cancer   . Family history of ovarian cancer   . Family history of colon cancer   . Primary cancer of appendix ([place]) 06/05/2019  . Osteoporosis 12/06/2014  . History of endometrial cancer 03/25/2011  . High cholesterol 03/23/2010  . 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 OSTEOARTHRITIS, HANDS, BILATERAL 11/22/2007  . FIBROIDS, UTERUS 09/30/2006  . Obesity due to excess calories with serious comorbidity 09/30/2006  . Essential hypertension, benign 09/30/2006    Past Surgical History:  Procedure Laterality Date  . ABDOMINAL HYSTERECTOMY  05-08-10   LAPAROSCOPIC ROBOTIC ASSISSTED TOTAL HYSTERECTOMY WITH BSO , BILAT. PELVIC AND PERIAORTIC LYMPH NODE EVAL. BY DR. Alycia Rossetti AT [facility]  . APPENDECTOMY     March 2021  . CESAREAN SECTION  01/08/80 11/21/82   x2  .  CHOLECYSTECTOMY N/A 11/16/2017   Procedure: LAPAROSCOPIC CHOLECYSTECTOMY;  Surgeon: Vickie Epley, MD;  Location: [facility] ORS;  Service: General;  Laterality: N/A;  . COLONOSCOPY  [id number]  . LAPAROSCOPIC APPENDECTOMY N/A 05/17/2019   Procedure: APPENDECTOMY LAPAROSCOPIC;  Surgeon: Herbert Pun, MD;  Location: [facility] ORS;  Service: General;  Laterality: N/A;    OB History   No obstetric history on file.      Home Medications    Prior to Admission medications   Medication Sig Start Date End Date Taking? Authorizing Provider  alendronate (FOSAMAX) 70 MG tablet TAKE 1 TABLET BY MOUTH EVERY 7 (SEVEN) DAYS. TAKE WITH A FULL GLASS OF WATER ON AN EMPTY STOMACH. 12/12/19   Bedsole, Amy E, MD  amoxicillin-clavulanate (AUGMENTIN) 875-125 MG tablet Take 1 tablet by mouth every 12 (twelve) hours. 01/30/20   Loura Halt A, NP  bisacodyl (DULCOLAX) 5 MG EC tablet Take 5 mg by mouth daily as needed for moderate constipation.    [provider]  calcium carbonate (TUMS - DOSED IN MG ELEMENTAL CALCIUM) 500 MG chewable tablet Chew 1 tablet by mouth 2 (two) times daily.     [provider]  cholecalciferol (VITAMIN D) 1000 units tablet Take 1,000 Units by mouth daily.    [provider]  diphenhydrAMINE HCl (BENADRYL PO) Take by mouth.    [provider]  docusate sodium (COLACE) 100 MG capsule Take 100 mg  by mouth daily as needed for mild constipation.    [provider]  glucosamine-chondroitin 500-400 MG tablet Take 2 tablets by mouth daily.    [provider]  meloxicam (MOBIC) 15 MG tablet TAKE 1 TABLET BY MOUTH EVERY DAY AS NEEDED FOR PAIN Patient taking differently: Take 15 mg by mouth daily as needed for pain.  05/14/19   Bedsole, Amy E, MD  ramipril (ALTACE) 5 MG capsule TAKE 1 CAPSULE BY MOUTH EVERY MORNING 11/23/19   Jinny Sanders, MD    Family History Family History  Problem Relation Age of Onset  . Colon cancer Maternal Grandfather         70's  . Pancreatic cancer Father        72s.   . Colon polyps Father   . Diabetes Brother   . Diabetes Brother   . Thyroid cancer Maternal Aunt 72  . Ovarian cancer Maternal Aunt 82  . Other Maternal Grandmother        complications of childbirth  . Parkinson's disease Maternal Aunt   . Esophageal cancer Neg Hx   . Rectal cancer Neg Hx   . Stomach cancer Neg Hx     Social History Social History   Tobacco Use  . Smoking status: Never Smoker  . Smokeless tobacco: Never Used  Vaping Use  . Vaping Use: Never used  Substance Use Topics  . Alcohol use: Yes    Comment: Occas-holidays  . Drug use: No     Allergies   Patient has no known allergies.   Review of Systems Review of Systems   Physical Exam Triage Vital Signs ED Triage Vitals  Enc Vitals Group     BP 01/30/20 1056 133/90     Pulse Rate 01/30/20 1056 69     Resp 01/30/20 1056 16     Temp 01/30/20 1056 98.9 F (37.2 C)     Temp Source 01/30/20 1056 Oral     SpO2 01/30/20 1056 93 %     Weight 01/30/20 1057 185 lb (83.9 kg)     Height --      Head Circumference --      Peak Flow --      Pain Score 01/30/20 1057 0     Pain Loc --      Pain Edu? --      Excl. in [place]? --    No data found.  Updated Vital Signs BP 133/90 (BP Location: Left Arm)   Pulse 69   Temp 98.9 F (37.2 C) (Oral)   Resp 16   Wt 185 lb (83.9 kg)   SpO2 93%   BMI 30.79 kg/m   Visual Acuity Right Eye Distance:   Left Eye Distance:   Bilateral Distance:    Right Eye Near:   Left Eye Near:    Bilateral Near:     Physical Exam Vitals and nursing note reviewed.  Constitutional:      General: She is not in acute distress.    Appearance: Normal appearance. She is not ill-appearing, toxic-appearing or diaphoretic.  HENT:     Head: Normocephalic.     Left Ear: A middle ear effusion is present.     Nose: Congestion and rhinorrhea present.     Mouth/Throat:     Pharynx: Oropharynx is clear.  Eyes:      Conjunctiva/sclera: Conjunctivae normal.  Cardiovascular:     Rate and Rhythm: Normal rate and regular rhythm.     Heart sounds:  Normal heart sounds.  Pulmonary:     Effort: Pulmonary effort is normal.     Breath sounds: Rales present.     Comments: Rales heard in left lower lung.  Abdominal:     Palpations: Abdomen is soft.     Tenderness: There is no abdominal tenderness.  Musculoskeletal:        General: Normal range of motion.     Cervical back: Normal range of motion.  Skin:    General: Skin is warm and dry.     Findings: No rash.  Neurological:     Mental Status: She is alert.  Psychiatric:        Mood and Affect: Mood normal.      UC Treatments / Results  Labs (all labs ordered are listed, but only abnormal results are displayed) Labs Reviewed - No data to display  EKG   Radiology No results found.  Procedures Procedures (including critical care time)  Medications Ordered in UC Medications - No data to display  Initial Impression / Assessment and Plan / UC Course  I have reviewed the triage vital signs and the nursing notes.  Pertinent labs & imaging results that were available during my care of the patient were reviewed by me and considered in my medical decision making (see chart for details).     Acute sinusitis.  There is some concern for possible developing pneumonia left lower lobe.  We will go ahead and cover with antibiotics at this time.  Amoxicillin as prescribed. Recommended Mucinex for cough, congestion and to thin mucus. Push fluids, rest Follow up as needed for continued or worsening symptoms  Final Clinical Impressions(s) / UC Diagnoses   Final diagnoses:  Acute non-recurrent maxillary sinusitis  Cough     Discharge Instructions     Antibiotics as prescribed.  Recommend Mucinex cough, congestion and sinus. Drink plenty of water to stay hydrated. Follow up as needed for continued or worsening symptoms     ED Prescriptions     Medication Sig Dispense Auth. Provider   amoxicillin-clavulanate (AUGMENTIN) 875-125 MG tablet Take 1 tablet by mouth every 12 (twelve) hours. 14 tablet Mako Pelfrey A, NP     PDMP not reviewed this encounter.   Orvan July, NP 01/30/20 1126

## 2020-02-18 NOTE — Assessment & Plan Note (Signed)
Not at goal but better than last year.. now in range for statin to be recommended. She is not currently interested in statin. Encouraged exercise, weight loss, healthy eating habits.

## 2020-02-18 NOTE — Assessment & Plan Note (Signed)
Stable, chronic.  Continue current medication.    

## 2020-03-04 ENCOUNTER — Other Ambulatory Visit: Payer: Self-pay | Admitting: Family Medicine

## 2020-06-09 ENCOUNTER — Other Ambulatory Visit: Payer: Self-pay | Admitting: Family Medicine

## 2020-06-09 DIAGNOSIS — Z1231 Encounter for screening mammogram for malignant neoplasm of breast: Secondary | ICD-10-CM

## 2020-07-29 ENCOUNTER — Other Ambulatory Visit: Payer: Self-pay

## 2020-07-29 ENCOUNTER — Ambulatory Visit
Admission: RE | Admit: 2020-07-29 | Discharge: 2020-07-29 | Disposition: A | Discharge status: Home / Self Care | Payer: Medicare HMO | Source: Ambulatory Visit | Attending: Family Medicine | Admitting: Family Medicine

## 2020-07-29 DIAGNOSIS — Z1231 Encounter for screening mammogram for malignant neoplasm of breast: Secondary | ICD-10-CM

## 2020-08-03 NOTE — Progress Notes (Signed)
Melissa Portocarrero T. Weldon Nouri, MD, [place] [facility] [place], [address]  Phone: [phone]  FAX: [phone]  Melissa [name] - 65 y.o. female  MRN [id number]  Date of Birth: May 29, 1955  Date: 08/04/2020  PCP: Jinny Sanders, MD  Referral: Jinny Sanders, MD  Chief Complaint  Patient presents with   Foot Pain    Top Upper Foot near ankle    This visit occurred during the SARS-CoV-2 public health emergency.  Safety protocols were in place, including screening questions prior to the visit, additional usage of staff PPE, and extensive cleaning of exam room while observing appropriate contact time as indicated for disinfecting solutions.   Subjective:   Melissa [name] is a 65 y.o. "very pleasant female patient" very pleasant female patient with Body mass index is 31.49 kg/m. who presents with the following:  She is a new patient to me, and she presents with R ankle pain.  12 d ago, anterior ankle pain, then last week got a little bit worse.  Since that time, she has been improving.  Did start to improve over the weekend.  She is having pain anteriorly without lateral medial pain.  She does not recall any kind of specific injury,.  She is not having any bruising.  She does have some mild fullness anteriorly.  She has been able to walk without any difficulty particular in the last few days.  Syndesmosis pain with _ Klieger  Review of Systems is noted in the HPI, as appropriate   Objective:   BP 120/80   Pulse 69   Temp 98 F (36.7 C) (Temporal)   Ht 5\' 5"  (1.651 m)   Wt 189 lb 4 oz (85.8 kg)   SpO2 98%   BMI 31.49 kg/m    ANKLE: R Echymosis: no Edema: Some mild fullness around the anterior ankle just caudal to the talus. ROM: Full dorsi and plantar flexion, inversion, eversion Gait: heel toe, non-antalgic Lateral Mall: NT Medial Mall: NT Talus: NT Navicular: NT Cuboid: NT Calcaneous: NT Metatarsals: NT 5th MT: NT Phalanges:  NT Achilles: NT Plantar Fascia: NT Fat Pad: NT Peroneals: NT Post Tib: NT Great Toe: Nml motion Ant Drawer: neg Talar Tilt: neg ATFL: NT CFL: NT Deltoid: NT Str: 5/5 Other Special tests: none Sensation: intact   Kleiger testing is minimally positive  Radiology:  Assessment and Plan:     ICD-10-CM   1. Acute right ankle pain  M25.571      She does have some pain at the syndesmosis, this is the only area that I can elicit pain.  It is resolving, so I do not think she really needs to do much other than returning to activity relatively slowly.   Signed,  Maud Deed. Darroll Bredeson, MD   Outpatient Encounter Medications as of 08/04/2020  Medication Sig   alendronate (FOSAMAX) 70 MG tablet TAKE 1 TABLET BY MOUTH EVERY 7 (SEVEN) DAYS. TAKE WITH A FULL GLASS OF WATER ON AN EMPTY STOMACH.   bisacodyl (DULCOLAX) 5 MG EC tablet Take 5 mg by mouth daily as needed for moderate constipation.   calcium carbonate (TUMS - DOSED IN MG ELEMENTAL CALCIUM) 500 MG chewable tablet Chew 1 tablet by mouth 2 (two) times daily.    cholecalciferol (VITAMIN D) 1000 units tablet Take 1,000 Units by mouth daily.   diphenhydrAMINE HCl (BENADRYL PO) Take by mouth.   docusate sodium (COLACE) 100 MG capsule Take 100 mg by mouth daily as needed  for mild constipation.   glucosamine-chondroitin 500-400 MG tablet Take 2 tablets by mouth daily.   meloxicam (MOBIC) 15 MG tablet TAKE 1 TABLET BY MOUTH EVERY DAY AS NEEDED FOR PAIN (Patient taking differently: Take 15 mg by mouth daily as needed for pain.)   ramipril (ALTACE) 5 MG capsule TAKE 1 CAPSULE BY MOUTH EVERY MORNING   [DISCONTINUED] amoxicillin-clavulanate (AUGMENTIN) 875-125 MG tablet Take 1 tablet by mouth every 12 (twelve) hours.   No facility-administered encounter medications on file as of 08/04/2020.

## 2020-08-04 ENCOUNTER — Encounter: Payer: Self-pay | Admitting: Family Medicine

## 2020-08-04 ENCOUNTER — Ambulatory Visit (INDEPENDENT_AMBULATORY_CARE_PROVIDER_SITE_OTHER): Payer: Medicare HMO | Admitting: Family Medicine

## 2020-08-04 ENCOUNTER — Other Ambulatory Visit: Payer: Self-pay

## 2020-08-04 VITALS — BP 120/80 | HR 69 | Temp 98.0°F | Ht 65.0 in | Wt 189.2 lb

## 2020-08-04 DIAGNOSIS — M25571 Pain in right ankle and joints of right foot: Secondary | ICD-10-CM

## 2020-08-12 DIAGNOSIS — H52213 Irregular astigmatism, bilateral: Secondary | ICD-10-CM | POA: Diagnosis not present

## 2020-08-25 ENCOUNTER — Other Ambulatory Visit: Payer: Self-pay | Admitting: Family Medicine

## 2020-10-29 DIAGNOSIS — Z6830 Body mass index (BMI) 30.0-30.9, adult: Secondary | ICD-10-CM | POA: Diagnosis not present

## 2020-10-29 DIAGNOSIS — R32 Unspecified urinary incontinence: Secondary | ICD-10-CM | POA: Diagnosis not present

## 2020-10-29 DIAGNOSIS — Z8542 Personal history of malignant neoplasm of other parts of uterus: Secondary | ICD-10-CM | POA: Diagnosis not present

## 2020-10-29 DIAGNOSIS — Z8249 Family history of ischemic heart disease and other diseases of the circulatory system: Secondary | ICD-10-CM | POA: Diagnosis not present

## 2020-10-29 DIAGNOSIS — D84822 Immunodeficiency due to external causes: Secondary | ICD-10-CM | POA: Diagnosis not present

## 2020-10-29 DIAGNOSIS — Z809 Family history of malignant neoplasm, unspecified: Secondary | ICD-10-CM | POA: Diagnosis not present

## 2020-10-29 DIAGNOSIS — E669 Obesity, unspecified: Secondary | ICD-10-CM | POA: Diagnosis not present

## 2020-10-29 DIAGNOSIS — R69 Illness, unspecified: Secondary | ICD-10-CM | POA: Diagnosis not present

## 2020-10-29 DIAGNOSIS — M810 Age-related osteoporosis without current pathological fracture: Secondary | ICD-10-CM | POA: Diagnosis not present

## 2020-10-29 DIAGNOSIS — I10 Essential (primary) hypertension: Secondary | ICD-10-CM | POA: Diagnosis not present

## 2020-10-29 DIAGNOSIS — Z833 Family history of diabetes mellitus: Secondary | ICD-10-CM | POA: Diagnosis not present

## 2020-10-29 DIAGNOSIS — Z7983 Long term (current) use of bisphosphonates: Secondary | ICD-10-CM | POA: Diagnosis not present

## 2020-11-02 ENCOUNTER — Other Ambulatory Visit: Payer: Self-pay | Admitting: Family Medicine

## 2020-11-26 ENCOUNTER — Telehealth: Payer: Self-pay | Admitting: Family Medicine

## 2020-11-26 DIAGNOSIS — E559 Vitamin D deficiency, unspecified: Secondary | ICD-10-CM

## 2020-11-26 DIAGNOSIS — E7800 Pure hypercholesterolemia, unspecified: Secondary | ICD-10-CM

## 2020-11-26 NOTE — Telephone Encounter (Signed)
-----   Message from Ellamae Sia sent at 11/17/2020 11:05 AM EDT ----- Regarding: Lab orders for Tuesday, 10.18.22 Patient is scheduled for CPX labs, please order future labs, Thanks , Karna Christmas

## 2020-12-09 ENCOUNTER — Other Ambulatory Visit: Payer: Medicare HMO

## 2020-12-16 ENCOUNTER — Encounter: Payer: Medicare HMO | Admitting: Family Medicine

## 2020-12-23 ENCOUNTER — Encounter: Payer: Self-pay | Admitting: Family Medicine

## 2020-12-23 ENCOUNTER — Other Ambulatory Visit: Payer: Self-pay

## 2020-12-23 ENCOUNTER — Ambulatory Visit (INDEPENDENT_AMBULATORY_CARE_PROVIDER_SITE_OTHER): Payer: Medicare HMO | Admitting: Family Medicine

## 2020-12-23 VITALS — BP 138/82 | HR 70 | Temp 97.1°F | Ht 65.0 in | Wt 190.0 lb

## 2020-12-23 DIAGNOSIS — M810 Age-related osteoporosis without current pathological fracture: Secondary | ICD-10-CM | POA: Diagnosis not present

## 2020-12-23 DIAGNOSIS — Z0000 Encounter for general adult medical examination without abnormal findings: Secondary | ICD-10-CM | POA: Diagnosis not present

## 2020-12-23 DIAGNOSIS — E559 Vitamin D deficiency, unspecified: Secondary | ICD-10-CM | POA: Diagnosis not present

## 2020-12-23 DIAGNOSIS — I10 Essential (primary) hypertension: Secondary | ICD-10-CM | POA: Diagnosis not present

## 2020-12-23 DIAGNOSIS — C181 Malignant neoplasm of appendix: Secondary | ICD-10-CM

## 2020-12-23 DIAGNOSIS — E7800 Pure hypercholesterolemia, unspecified: Secondary | ICD-10-CM

## 2020-12-23 LAB — LIPID PANEL
Cholesterol: 179 mg/dL (ref 0–200)
HDL: 45.8 mg/dL (ref >39.00)
LDL Cholesterol: 115 mg/dL — ABNORMAL HIGH (ref 0–99)
NonHDL: 133.25
Total CHOL/HDL Ratio: 4
Triglycerides: 93 mg/dL (ref 0.0–149.0)
VLDL: 18.6 mg/dL (ref 0.0–40.0)

## 2020-12-23 LAB — COMPREHENSIVE METABOLIC PANEL
ALT: 17 U/L (ref 0–35)
AST: 19 U/L (ref 0–37)
Albumin: 4.6 g/dL (ref 3.5–5.2)
Alkaline Phosphatase: 56 U/L (ref 39–117)
BUN: 17 mg/dL (ref 6–23)
CO2: 30 mEq/L (ref 19–32)
Calcium: 9.4 mg/dL (ref 8.4–10.5)
Chloride: 102 mEq/L (ref 96–112)
Creatinine, Ser: 0.7 mg/dL (ref 0.40–1.20)
GFR: 89.65 mL/min (ref >60.00)
Glucose, Bld: 91 mg/dL (ref 70–99)
Potassium: 4.3 mEq/L (ref 3.5–5.1)
Sodium: 139 mEq/L (ref 135–145)
Total Bilirubin: 0.6 mg/dL (ref 0.2–1.2)
Total Protein: 7.5 g/dL (ref 6.0–8.3)

## 2020-12-23 LAB — VITAMIN D 25 HYDROXY (VIT D DEFICIENCY, FRACTURES): VITD: 33.05 ng/mL (ref 30.00–100.00)

## 2020-12-23 NOTE — Patient Instructions (Addendum)
Please stop at the lab to have labs drawn.  Have Shingrix vaccine at the pharmacy.  Please call the location of your choice from the menu below to schedule your Mammogram and/or Bone Density appointment.    [facility]                      Phone:  [phone], [place] [address]                                                             Services: Traditional and 3D Mammogram, [place] [facility]                 Phone: [phone] [address], [place] [address]    Service: Bone Density ONLY   *this site does NOT perform mammograms  [facility]                        Phone:  [phone] [address]                                  [place], [place] [id number]                                            Services:  3D Mammogram and Bone Density

## 2020-12-23 NOTE — Assessment & Plan Note (Signed)
Stable, chronic.  Continue current medication.   Ramipril 5 mg daily

## 2020-12-23 NOTE — Assessment & Plan Note (Signed)
Due for re-eval. 

## 2020-12-23 NOTE — Progress Notes (Signed)
Patient ID: Melissa [name], female    DOB: 1955-03-16, 65 y.o.   MRN: [id number]  This visit was conducted in person.  BP 138/82   Pulse 70   Temp (!) 97.1 F (36.2 C) (Temporal)   Ht 5\' 5"  (1.651 m)   Wt 190 lb (86.2 kg)   SpO2 98%   BMI 31.62 kg/m    CC: Chief Complaint  Patient presents with   Annual Exam    Subjective:   HPI: Melissa [name] is a 66 y.o. female presenting on 12/23/2020 for Annual Exam  The patient presents for annual medicare wellness, complete physical and review of chronic health problems. He/She also has the following acute concerns today: NONE  I have personally reviewed the Medicare Annual Wellness questionnaire and have noted 1. The patient's medical and social history 2. Their use of alcohol, tobacco or illicit drugs 3. Their current medications and supplements 4. The patient's functional ability including ADL's, fall risks, home safety risks and hearing or visual             impairment. 5. Diet and physical activities 6. Evidence for depression or mood disorders 7.         Updated provider list Cognitive evaluation was performed and recorded on pt medicare questionnaire form. The patients weight, height, BMI and visual acuity have been recorded in the chart   I have made referrals, counseling and provided education to the patient based review of the above and I have provided the pt with a written personalized care plan for preventive services.   Documentation of this information was scanned into the electronic record under the media tab.   Advance directives and end of life planning reviewed in detail with patient and documented in EMR. Patient given handout on advance care directives if needed. HCPOA and living will updated if needed.  Fall: once when tripped, no injury.  [facility] Visit from 12/23/2020 in [facility] [place]  PHQ-2 Total Score 0      Hypertension:    Good control on ramipril 5 mg  daily BP Readings from Last 3 Encounters:  12/23/20 138/82  08/04/20 120/80  01/30/20 133/90  Using medication without problems or lightheadedness:  none Chest pain with exertion:none Edema:none Short of breath:none Average home BPs: Other issues:  Elevated Cholesterol:  Due for re-eval. Using medications without problems: Muscle aches:  Diet compliance: good Exercise: 4-5 days a week, treadmill Other complaints:    Vit D due for re-eval   History of endometrial cancer Patient Care Team    Relationship Specialty Notifications Start End  Jinny Sanders, MD PCP - General Family Medicine  12/08/18   Nancy Marus, MD Consulting Physician Gynecologic Oncology  12/08/18   Vickie Epley, MD (Inactive)  General Surgery  12/08/18   Ladene Artist, MD Consulting Physician Gastroenterology  12/08/18   Clent Jacks, RN Oncology Nurse Navigator  All results, Admissions 06/05/19      Relevant past medical, surgical, family and social history reviewed and updated as indicated. Interim medical history since our last visit reviewed. Allergies and medications reviewed and updated. Outpatient Medications Prior to Visit  Medication Sig Dispense Refill   alendronate (FOSAMAX) 70 MG tablet TAKE 1 TABLET BY MOUTH EVERY 7 (SEVEN) DAYS. TAKE WITH A FULL GLASS OF WATER ON AN EMPTY STOMACH. 12 tablet 0   bisacodyl (DULCOLAX) 5 MG EC tablet Take 5 mg by mouth daily as needed for moderate constipation.  calcium carbonate (TUMS - DOSED IN MG ELEMENTAL CALCIUM) 500 MG chewable tablet Chew 1 tablet by mouth 2 (two) times daily.      cholecalciferol (VITAMIN D) 1000 units tablet Take 1,000 Units by mouth daily.     diphenhydrAMINE HCl (BENADRYL PO) Take by mouth.     docusate sodium (COLACE) 100 MG capsule Take 100 mg by mouth daily as needed for mild constipation.     glucosamine-chondroitin 500-400 MG tablet Take 2 tablets by mouth daily.     meloxicam (MOBIC) 15 MG tablet TAKE 1 TABLET BY  MOUTH EVERY DAY AS NEEDED FOR PAIN (Patient taking differently: Take 15 mg by mouth daily as needed for pain.) 90 tablet 0   ramipril (ALTACE) 5 MG capsule TAKE 1 CAPSULE BY MOUTH EVERY DAY IN THE MORNING 90 capsule 1   No facility-administered medications prior to visit.     Per HPI unless specifically indicated in ROS section below Review of Systems  Constitutional:  Negative for fatigue and fever.  HENT:  Negative for congestion.   Eyes:  Negative for pain.  Respiratory:  Negative for cough and shortness of breath.   Cardiovascular:  Negative for chest pain, palpitations and leg swelling.  Gastrointestinal:  Negative for abdominal pain.  Genitourinary:  Negative for dysuria and vaginal bleeding.  Musculoskeletal:  Negative for back pain.  Neurological:  Negative for syncope, light-headedness and headaches.  Psychiatric/Behavioral:  Negative for dysphoric mood.   Objective:  BP 138/82   Pulse 70   Temp (!) 97.1 F (36.2 C) (Temporal)   Ht 5\' 5"  (1.651 m)   Wt 190 lb (86.2 kg)   SpO2 98%   BMI 31.62 kg/m   Wt Readings from Last 3 Encounters:  12/23/20 190 lb (86.2 kg)  08/04/20 189 lb 4 oz (85.8 kg)  01/30/20 185 lb (83.9 kg)      Physical Exam Vitals and nursing note reviewed. Exam conducted with a chaperone present.  Constitutional:      General: She is not in acute distress.    Appearance: Normal appearance. She is well-developed. She is not ill-appearing or toxic-appearing.  HENT:     Head: Normocephalic.     Right Ear: Hearing, tympanic membrane, ear canal and external ear normal.     Left Ear: Hearing, tympanic membrane, ear canal and external ear normal.     Nose: Nose normal.  Eyes:     General: Lids are normal. Lids are everted, no foreign bodies appreciated.     Conjunctiva/sclera: Conjunctivae normal.     Pupils: Pupils are equal, round, and reactive to light.  Neck:     Thyroid: No thyroid mass or thyromegaly.     Vascular: No carotid bruit.      Trachea: Trachea normal.  Cardiovascular:     Rate and Rhythm: Normal rate and regular rhythm.     Heart sounds: Normal heart sounds, S1 normal and S2 normal. No murmur heard.   No gallop.  Pulmonary:     Effort: Pulmonary effort is normal. No respiratory distress.     Breath sounds: Normal breath sounds. No wheezing, rhonchi or rales.  Abdominal:     General: Bowel sounds are normal. There is no distension or abdominal bruit.     Palpations: Abdomen is soft. There is no fluid wave or mass.     Tenderness: There is no abdominal tenderness. There is no guarding or rebound.     Hernia: No hernia is present. There is no hernia  in the left inguinal area or right inguinal area.  Genitourinary:    Labia:        Right: No rash.        Left: No rash.      Vagina: Normal.     Uterus: Absent.      Adnexa: Right adnexa normal and left adnexa normal.     Comments:  Some scar tissue present n vaginal canal Musculoskeletal:     Cervical back: Normal range of motion and neck supple.  Lymphadenopathy:     Cervical: No cervical adenopathy.  Skin:    General: Skin is warm and dry.     Findings: No rash.  Neurological:     Mental Status: She is alert.     Cranial Nerves: No cranial nerve deficit.     Sensory: No sensory deficit.  Psychiatric:        Mood and Affect: Mood is not anxious or depressed.        Speech: Speech normal.        Behavior: Behavior normal. Behavior is cooperative.        Judgment: Judgment normal.      Results for orders placed or performed in visit on 12/04/19  VITAMIN D 25 Hydroxy (Vit-D Deficiency, Fractures)  Result Value Ref Range   VITD 38.49 30.00 - 100.00 ng/mL  Comprehensive metabolic panel  Result Value Ref Range   Sodium 136 135 - 145 mEq/L   Potassium 4.5 3.5 - 5.1 mEq/L   Chloride 100 96 - 112 mEq/L   CO2 29 19 - 32 mEq/L   Glucose, Bld 85 70 - 99 mg/dL   BUN 12 6 - 23 mg/dL   Creatinine, Ser 0.70 0.40 - 1.20 mg/dL   Total Bilirubin 0.5 0.2 - 1.2  mg/dL   Alkaline Phosphatase 61 39 - 117 U/L   AST 16 0 - 37 U/L   ALT 13 0 - 35 U/L   Total Protein 7.0 6.0 - 8.3 g/dL   Albumin 4.4 3.5 - 5.2 g/dL   GFR 90.22 >60.00 mL/min   Calcium 9.3 8.4 - 10.5 mg/dL  Lipid panel  Result Value Ref Range   Cholesterol 166 0 - 200 mg/dL   Triglycerides 81.0 0.0 - 149.0 mg/dL   HDL 43.00 >39.00 mg/dL   VLDL 16.2 0.0 - 40.0 mg/dL   LDL Cholesterol 107 (H) 0 - 99 mg/dL   Total CHOL/HDL Ratio 4    NonHDL 123.46     This visit occurred during the SARS-CoV-2 public health emergency.  Safety protocols were in place, including screening questions prior to the visit, additional usage of staff PPE, and extensive cleaning of exam room while observing appropriate contact time as indicated for disinfecting solutions.   COVID 19 screen:  No recent travel or known exposure to COVID19 The patient denies respiratory symptoms of COVID 19 at this time. The importance of social distancing was discussed today.   Assessment and Plan   The patient's preventative maintenance and recommended screening tests for an annual wellness exam were reviewed in full today. Brought up to date unless services declined.  Counselled on the importance of diet, exercise, and its role in overall health and mortality. The patient's FH and SH was reviewed, including their home life, tobacco status, and drug and alcohol status.   Need DVE yearly per pt.. No pap recommended. No vaginal issues, no lower abdominal pain. Vaccines: due for Td, uptodate  flu, PNA, consider shingrix Colon: Dr. Fuller Plan.  07/2019 repeat 3 years Mammogram: 07/2020 3D mammo  nml... heterogenously dense breasts.Marland Kitchen   DEXA: mother with osteoporosis, no other risk factors.. 2016 osteopenia  2020 osteoporosis started on fosamax. Repeat in 2022 Nonsmoker Hep C screen:  done HIV: done    Problem List Items Addressed This Visit     Essential hypertension, benign    Stable, chronic.  Continue current  medication.   Ramipril 5 mg daily      High cholesterol    Due for re-eval.      Osteoporosis   Relevant Orders   DG Bone Density   Primary cancer of appendix ([place])    Q3 year colonoscopy per Dr. Fuller Plan.      Other Visit Diagnoses     Medicare annual wellness visit, subsequent    -  Primary   Vitamin D deficiency           Eliezer Lofts, MD

## 2020-12-23 NOTE — Assessment & Plan Note (Signed)
Q3 year colonoscopy per Dr. Fuller Plan.

## 2020-12-25 ENCOUNTER — Other Ambulatory Visit: Payer: Self-pay | Admitting: Family Medicine

## 2020-12-25 MED ORDER — ATORVASTATIN CALCIUM 10 MG PO TABS: 10.0000 mg | ORAL_TABLET | Freq: Every day | ORAL | 11 refills | Status: DC

## 2021-01-24 ENCOUNTER — Other Ambulatory Visit: Payer: Self-pay | Admitting: Family Medicine

## 2021-04-13 ENCOUNTER — Other Ambulatory Visit: Payer: Self-pay | Admitting: Family Medicine

## 2021-05-28 ENCOUNTER — Ambulatory Visit
Admission: RE | Admit: 2021-05-28 | Discharge: 2021-05-28 | Disposition: A | Discharge status: Home / Self Care | Payer: Medicare HMO | Source: Ambulatory Visit | Attending: Family Medicine | Admitting: Family Medicine

## 2021-05-28 DIAGNOSIS — M8589 Other specified disorders of bone density and structure, multiple sites: Secondary | ICD-10-CM | POA: Diagnosis not present

## 2021-05-28 DIAGNOSIS — M810 Age-related osteoporosis without current pathological fracture: Secondary | ICD-10-CM

## 2021-05-28 DIAGNOSIS — Z780 Asymptomatic menopausal state: Secondary | ICD-10-CM | POA: Diagnosis not present

## 2021-06-16 ENCOUNTER — Other Ambulatory Visit: Payer: Self-pay | Admitting: Family Medicine

## 2021-06-16 DIAGNOSIS — Z1231 Encounter for screening mammogram for malignant neoplasm of breast: Secondary | ICD-10-CM

## 2021-07-04 ENCOUNTER — Other Ambulatory Visit: Payer: Self-pay | Admitting: Family Medicine

## 2021-07-22 ENCOUNTER — Telehealth: Payer: Self-pay | Admitting: Family Medicine

## 2021-07-22 NOTE — Telephone Encounter (Signed)
Err

## 2021-07-23 ENCOUNTER — Other Ambulatory Visit (INDEPENDENT_AMBULATORY_CARE_PROVIDER_SITE_OTHER): Payer: Medicare HMO

## 2021-07-23 DIAGNOSIS — E7800 Pure hypercholesterolemia, unspecified: Secondary | ICD-10-CM | POA: Diagnosis not present

## 2021-07-23 LAB — LIPID PANEL
Cholesterol: 129 mg/dL (ref 0–200)
HDL: 48.9 mg/dL (ref >39.00)
LDL Cholesterol: 59 mg/dL (ref 0–99)
NonHDL: 79.69
Total CHOL/HDL Ratio: 3
Triglycerides: 101 mg/dL (ref 0.0–149.0)
VLDL: 20.2 mg/dL (ref 0.0–40.0)

## 2021-07-23 LAB — HEPATIC FUNCTION PANEL
ALT: 17 U/L (ref 0–35)
AST: 19 U/L (ref 0–37)
Albumin: 4.2 g/dL (ref 3.5–5.2)
Alkaline Phosphatase: 56 U/L (ref 39–117)
Bilirubin, Direct: 0.1 mg/dL (ref 0.0–0.3)
Total Bilirubin: 0.7 mg/dL (ref 0.2–1.2)
Total Protein: 6.8 g/dL (ref 6.0–8.3)

## 2021-07-23 NOTE — Addendum Note (Signed)
Addended by: Ellamae Sia on: 07/23/2021 08:55 AM   Modules accepted: Orders

## 2021-07-30 ENCOUNTER — Ambulatory Visit
Admission: RE | Admit: 2021-07-30 | Discharge: 2021-07-30 | Disposition: A | Discharge status: Home / Self Care | Payer: Medicare HMO | Source: Ambulatory Visit | Attending: Family Medicine | Admitting: Family Medicine

## 2021-07-30 DIAGNOSIS — Z1231 Encounter for screening mammogram for malignant neoplasm of breast: Secondary | ICD-10-CM

## 2021-08-20 DIAGNOSIS — H5210 Myopia, unspecified eye: Secondary | ICD-10-CM | POA: Diagnosis not present

## 2021-09-30 DIAGNOSIS — H35363 Drusen (degenerative) of macula, bilateral: Secondary | ICD-10-CM | POA: Diagnosis not present

## 2021-09-30 DIAGNOSIS — H43813 Vitreous degeneration, bilateral: Secondary | ICD-10-CM | POA: Diagnosis not present

## 2021-09-30 DIAGNOSIS — H43392 Other vitreous opacities, left eye: Secondary | ICD-10-CM | POA: Diagnosis not present

## 2021-09-30 DIAGNOSIS — H35342 Macular cyst, hole, or pseudohole, left eye: Secondary | ICD-10-CM | POA: Diagnosis not present

## 2021-10-14 ENCOUNTER — Other Ambulatory Visit: Payer: Self-pay | Admitting: Family Medicine

## 2021-10-14 MED ORDER — MELOXICAM 15 MG PO TABS: 15.0000 mg | ORAL_TABLET | Freq: Every day | ORAL | 0 refills | Status: DC | PRN

## 2021-10-14 NOTE — Telephone Encounter (Signed)
  Encourage patient to contact the pharmacy for refills or they can request refills through [facility]  Did the patient contact the pharmacy: yes   LAST APPOINTMENT DATE:  Please schedule appointment if longer than 1 year  NEXT APPOINTMENT DATE:01/01/2022  MEDICATION:meloxicam (MOBIC) 15 MG tablet  Is the patient out of medication? yes  If not, how much is left?  Is this a 90 day supply: yes  [id number] [place]  [phone] Fax:  [phone]     Let patient know to contact pharmacy at the end of the day to make sure medication is ready.  Please notify patient to allow 48-72 hours to process  CLINICAL FILLS OUT ALL BELOW:

## 2021-10-14 NOTE — Telephone Encounter (Signed)
Last office visit 12/23/20 for [place].  Last refilled 05/14/2019 for #90 with no refills.  Next Appt: 01/08/22 for CPE.

## 2021-10-15 ENCOUNTER — Telehealth: Payer: Self-pay | Admitting: *Deleted

## 2021-10-15 NOTE — Patient Outreach (Signed)
  Care Coordination   Initial Visit Note   10/15/2021 Name: Melissa [name] MRN: [id number] DOB: 05-28-1955  Melissa [name] is a 66 y.o. year old female who sees Jinny Sanders, MD for primary care. I spoke with  [name] by phone today  RN discussed services [facility] services, RN, SW, and Pharmacist. Patient declined services.    Goals Addressed   None     SDOH assessments and interventions completed:  No     Care Coordination Interventions Activated:  No  Care Coordination Interventions:  No, not indicated   Follow up plan: No further intervention required.   Encounter Outcome:  Pt. Visit Completed  [facility] [phone]

## 2021-11-11 DIAGNOSIS — H43392 Other vitreous opacities, left eye: Secondary | ICD-10-CM | POA: Diagnosis not present

## 2021-11-11 DIAGNOSIS — H35342 Macular cyst, hole, or pseudohole, left eye: Secondary | ICD-10-CM | POA: Diagnosis not present

## 2021-11-11 DIAGNOSIS — H43813 Vitreous degeneration, bilateral: Secondary | ICD-10-CM | POA: Diagnosis not present

## 2021-11-11 DIAGNOSIS — H35363 Drusen (degenerative) of macula, bilateral: Secondary | ICD-10-CM | POA: Diagnosis not present

## 2021-12-07 ENCOUNTER — Other Ambulatory Visit: Payer: Self-pay | Admitting: Family Medicine

## 2021-12-07 DIAGNOSIS — Z809 Family history of malignant neoplasm, unspecified: Secondary | ICD-10-CM | POA: Diagnosis not present

## 2021-12-07 DIAGNOSIS — I739 Peripheral vascular disease, unspecified: Secondary | ICD-10-CM | POA: Diagnosis not present

## 2021-12-07 DIAGNOSIS — M8580 Other specified disorders of bone density and structure, unspecified site: Secondary | ICD-10-CM | POA: Diagnosis not present

## 2021-12-07 DIAGNOSIS — I10 Essential (primary) hypertension: Secondary | ICD-10-CM | POA: Diagnosis not present

## 2021-12-07 DIAGNOSIS — Z6831 Body mass index (BMI) 31.0-31.9, adult: Secondary | ICD-10-CM | POA: Diagnosis not present

## 2021-12-07 DIAGNOSIS — E785 Hyperlipidemia, unspecified: Secondary | ICD-10-CM | POA: Diagnosis not present

## 2021-12-07 DIAGNOSIS — Z008 Encounter for other general examination: Secondary | ICD-10-CM | POA: Diagnosis not present

## 2021-12-07 DIAGNOSIS — M810 Age-related osteoporosis without current pathological fracture: Secondary | ICD-10-CM | POA: Diagnosis not present

## 2021-12-07 DIAGNOSIS — M1990 Unspecified osteoarthritis, unspecified site: Secondary | ICD-10-CM | POA: Diagnosis not present

## 2021-12-07 DIAGNOSIS — E669 Obesity, unspecified: Secondary | ICD-10-CM | POA: Diagnosis not present

## 2021-12-07 DIAGNOSIS — Z7983 Long term (current) use of bisphosphonates: Secondary | ICD-10-CM | POA: Diagnosis not present

## 2021-12-07 DIAGNOSIS — Z791 Long term (current) use of non-steroidal anti-inflammatories (NSAID): Secondary | ICD-10-CM | POA: Diagnosis not present

## 2021-12-07 DIAGNOSIS — R32 Unspecified urinary incontinence: Secondary | ICD-10-CM | POA: Diagnosis not present

## 2021-12-10 ENCOUNTER — Telehealth: Payer: Self-pay | Admitting: Family Medicine

## 2021-12-10 DIAGNOSIS — E559 Vitamin D deficiency, unspecified: Secondary | ICD-10-CM

## 2021-12-10 DIAGNOSIS — E7800 Pure hypercholesterolemia, unspecified: Secondary | ICD-10-CM

## 2021-12-10 NOTE — Telephone Encounter (Signed)
-----   Message from Ellamae Sia sent at 12/07/2021 11:51 AM EDT ----- Regarding: Lab orders for Tuesday, 10.31.23 Patient is scheduled for CPX labs, please order future labs, Thanks , Karna Christmas

## 2021-12-23 ENCOUNTER — Other Ambulatory Visit (INDEPENDENT_AMBULATORY_CARE_PROVIDER_SITE_OTHER): Payer: Medicare HMO

## 2021-12-23 DIAGNOSIS — E559 Vitamin D deficiency, unspecified: Secondary | ICD-10-CM

## 2021-12-23 DIAGNOSIS — E7800 Pure hypercholesterolemia, unspecified: Secondary | ICD-10-CM | POA: Diagnosis not present

## 2021-12-23 LAB — LIPID PANEL
Cholesterol: 123 mg/dL (ref 0–200)
HDL: 43.1 mg/dL (ref >39.00)
LDL Cholesterol: 64 mg/dL (ref 0–99)
NonHDL: 80.18
Total CHOL/HDL Ratio: 3
Triglycerides: 80 mg/dL (ref 0.0–149.0)
VLDL: 16 mg/dL (ref 0.0–40.0)

## 2021-12-23 LAB — COMPREHENSIVE METABOLIC PANEL
ALT: 17 U/L (ref 0–35)
AST: 18 U/L (ref 0–37)
Albumin: 4.2 g/dL (ref 3.5–5.2)
Alkaline Phosphatase: 55 U/L (ref 39–117)
BUN: 23 mg/dL (ref 6–23)
CO2: 28 mEq/L (ref 19–32)
Calcium: 9 mg/dL (ref 8.4–10.5)
Chloride: 103 mEq/L (ref 96–112)
Creatinine, Ser: 0.66 mg/dL (ref 0.40–1.20)
GFR: 90.3 mL/min (ref >60.00)
Glucose, Bld: 87 mg/dL (ref 70–99)
Potassium: 4.4 mEq/L (ref 3.5–5.1)
Sodium: 139 mEq/L (ref 135–145)
Total Bilirubin: 0.6 mg/dL (ref 0.2–1.2)
Total Protein: 6.7 g/dL (ref 6.0–8.3)

## 2021-12-23 LAB — VITAMIN D 25 HYDROXY (VIT D DEFICIENCY, FRACTURES): VITD: 35.33 ng/mL (ref 30.00–100.00)

## 2021-12-23 NOTE — Progress Notes (Signed)
No critical labs need to be addressed urgently. We will discuss labs in detail at upcoming office visit.   

## 2021-12-25 ENCOUNTER — Ambulatory Visit (INDEPENDENT_AMBULATORY_CARE_PROVIDER_SITE_OTHER): Payer: Medicare HMO

## 2021-12-25 VITALS — Ht 65.5 in | Wt 187.0 lb

## 2021-12-25 DIAGNOSIS — Z0000 Encounter for general adult medical examination without abnormal findings: Secondary | ICD-10-CM | POA: Diagnosis not present

## 2021-12-25 NOTE — Progress Notes (Signed)
I connected with Melissa [name] today by telephone and verified that I am speaking with the correct person using two identifiers. Location patient: home Location provider: work Persons participating in the virtual visit: Melissa [name], Glenna Durand LPN.   I discussed the limitations, risks, security and privacy concerns of performing an evaluation and management service by telephone and the availability of in person appointments. I also discussed with the patient that there may be a patient responsible charge related to this service. The patient expressed understanding and verbally consented to this telephonic visit.    Interactive audio and video telecommunications were attempted between this provider and patient, however failed, due to patient having technical difficulties OR patient did not have access to video capability.  We continued and completed visit with audio only.     Vital signs may be patient reported or missing.  Subjective:   Melissa [name] is a 66 y.o. female who presents for Medicare Annual (Subsequent) preventive examination.  Review of Systems     Cardiac Risk Factors include: advanced age (>102mn, >>27women);hypertension;obesity (BMI >30kg/m2)     Objective:    Today's Vitals   12/25/21 1512  Weight: 187 lb (84.8 kg)  Height: 5' 5.5" (1.664 m)   Body mass index is 30.65 kg/m.     12/25/2021    3:16 PM 05/17/2019   11:36 AM 11/16/2017   11:28 AM 11/16/2017   11:04 AM 11/11/2017    1:09 PM 11/24/2016    9:43 AM 04/25/2014   12:10 PM  Advanced Directives  Does Patient Have a Medical Advance Directive? Yes No;Yes  Yes Yes Yes Yes  Type of AParamedicof ALancasterLiving will HRoyal KuniaLiving will  HWendoverLiving will  HPalo PintoLiving will   Copy of HB[place] Veintic[place]oin Chart? Yes - validated most recent copy scanned in chart (See row information)  Yes No -  copy requested  No - copy requested     Current Medications (verified) Outpatient Encounter Medications as of 12/25/2021  Medication Sig   alendronate (FOSAMAX) 70 MG tablet TAKE 1 TABLET BY MOUTH EVERY 7 (SEVEN) DAYS. TAKE WITH A FULL GLASS OF WATER ON AN EMPTY STOMACH.   atorvastatin (LIPITOR) 10 MG tablet TAKE 1 TABLET BY MOUTH EVERY DAY   bisacodyl (DULCOLAX) 5 MG EC tablet Take 5 mg by mouth daily as needed for moderate constipation.   calcium carbonate (TUMS - DOSED IN MG ELEMENTAL CALCIUM) 500 MG chewable tablet Chew 1 tablet by mouth 2 (two) times daily.    cholecalciferol (VITAMIN D) 1000 units tablet Take 1,000 Units by mouth daily.   diphenhydrAMINE HCl (BENADRYL PO) Take by mouth.   docusate sodium (COLACE) 100 MG capsule Take 100 mg by mouth daily as needed for mild constipation.   glucosamine-chondroitin 500-400 MG tablet Take 2 tablets by mouth daily.   meloxicam (MOBIC) 15 MG tablet Take 1 tablet (15 mg total) by mouth daily as needed for pain.   ramipril (ALTACE) 5 MG capsule TAKE 1 CAPSULE BY MOUTH EVERY DAY IN THE MORNING   No facility-administered encounter medications on file as of 12/25/2021.    Allergies (verified) Patient has no known allergies.   History: Past Medical History:  Diagnosis Date   Allergy    seasonal   [place]   radical hysterectomy, chemotherapy and radiation therapy   Chronic cholecystitis    Family history of colon cancer  Family history of ovarian cancer    Family history of pancreatic cancer    Hypertension    Osteoarthritis    HX OF SOME  OSTEOARTHRITIS   Past Surgical History:  Procedure Laterality Date   ABDOMINAL HYSTERECTOMY  05-08-10   LAPAROSCOPIC ROBOTIC ASSISSTED TOTAL HYSTERECTOMY WITH BSO , BILAT. PELVIC AND PERIAORTIC LYMPH NODE EVAL. BY DR. Alycia Rossetti AT [facility]   APPENDECTOMY     March 2021   CESAREAN SECTION  01/08/80 11/21/82   x2   CHOLECYSTECTOMY N/A 11/16/2017   Procedure:  LAPAROSCOPIC CHOLECYSTECTOMY;  Surgeon: Vickie Epley, MD;  Location: [facility] ORS;  Service: General;  Laterality: N/A;   COLONOSCOPY  [id number]   LAPAROSCOPIC APPENDECTOMY N/A 05/17/2019   Procedure: APPENDECTOMY LAPAROSCOPIC;  Surgeon: Herbert Pun, MD;  Location: [facility] ORS;  Service: General;  Laterality: N/A;   Family History  Problem Relation Age of Onset   Colon cancer Maternal Grandfather        70's   Pancreatic cancer Father        70s.    Colon polyps Father    Diabetes Brother    Diabetes Brother    Thyroid cancer Maternal Aunt 72   Ovarian cancer Maternal Aunt 82   Other Maternal Grandmother        complications of childbirth   Parkinson's disease Maternal Aunt    Esophageal cancer Neg Hx    Rectal cancer Neg Hx    Stomach cancer Neg Hx    Social History   Socioeconomic History   Marital status: Married    Spouse name: Not on file   Number of children: Not on file   Years of education: Not on file   Highest education level: Not on file  Occupational History   Not on file  Tobacco Use   Smoking status: Never   Smokeless tobacco: Never  Vaping Use   Vaping Use: Never used  Substance and Sexual Activity   Alcohol use: Yes    Comment: Occas-holidays   Drug use: No   Sexual activity: Never  Other Topics Concern   Not on file  Social History Narrative   exercsie at [facility] 4-6 times a week.;  Diet: good;      In [place]; with husband; 2 sons; never smoked; seldom alcohol.    Social Determinants of Health   Financial Resource Strain: Low Risk  (12/25/2021)   Overall Financial Resource Strain (CARDIA)    Difficulty of Paying Living Expenses: Not hard at all  Food Insecurity: No Food Insecurity (12/25/2021)   Hunger Vital Sign    Worried About Running Out of Food in the Last Year: Never true    Ran Out of Food in the Last Year: Never true  Transportation Needs: No Transportation Needs (12/25/2021)   PRAPARE - Radiographer, therapeutic (Medical): No    Lack of Transportation (Non-Medical): No  Physical Activity: Sufficiently Active (12/25/2021)   Exercise Vital Sign    Days of Exercise per Week: 3 days    Minutes of Exercise per Session: 60 min  Stress: Stress Concern Present (12/25/2021)   [employer]    Feeling of Stress : To some extent  Social Connections: Not on file    Tobacco Counseling Counseling given: Not Answered   Clinical Intake:  Pre-visit preparation completed: Yes  Pain : No/denies pain     Nutritional Status: BMI > 30  Obese Nutritional Risks: None Diabetes:  No  How often do you need to have someone help you when you read instructions, pamphlets, or other written materials from your doctor or pharmacy?: 1 - Never What is the last grade level you completed in school?: 12th grade  Diabetic? no  Interpreter Needed?: No  Information entered by :: NAllen LPN   Activities of Daily Living    12/25/2021    3:17 PM 12/23/2021   10:03 AM  In your present state of health, do you have any difficulty performing the following activities:  Hearing? 0 0  Vision? 0 0  Difficulty concentrating or making decisions? 0 0  Walking or climbing stairs? 0 0  Dressing or bathing? 0 0  Doing errands, shopping? 0 0  Preparing Food and eating ? N N  Using the Toilet? N N  In the past six months, have you accidently leaked urine? Y Y  Do you have problems with loss of bowel control? N N  Managing your Medications? N N  Managing your Finances? N N  Housekeeping or managing your Housekeeping? N N    Patient Care Team: Jinny Sanders, MD as PCP - General (Family Medicine) Nancy Marus, MD as Consulting Physician (Gynecologic Oncology) Vickie Epley, MD (Inactive) (General Surgery) Ladene Artist, MD as Consulting Physician (Gastroenterology) Clent Jacks, RN as Oncology Nurse Navigator  Indicate any recent  Medical Services you may have received from other than [facility] providers in the past year (date may be approximate).     Assessment:   This is a routine wellness examination for [place].  Hearing/Vision screen Vision Screening - Comments:: Regular eye exams, Dr. Marvel Plan  Dietary issues and exercise activities discussed: Current Exercise Habits: Home exercise routine, Type of exercise: treadmill, Time (Minutes): 60, Frequency (Times/Week): 3, Weekly Exercise (Minutes/Week): 180   Goals Addressed             This Visit's Progress    Patient Stated       12/25/2021, wants to lose weight       Depression Screen    12/25/2021    3:17 PM 12/23/2020   10:34 AM 12/11/2019    3:28 PM 12/08/2018   11:53 AM 12/02/2017    2:06 PM 11/02/2016   11:53 AM  PHQ 2/9 Scores  PHQ - 2 Score 0 0 2 2 0 2  PHQ- 9 Score   '6 6  7    '$ Fall Risk    12/25/2021    3:17 PM 12/23/2021   10:03 AM 12/23/2020   10:34 AM 12/11/2019    3:28 PM 12/08/2018   11:04 AM  Fall Risk   Falls in the past year? 0 0 1 0 0  Number falls in past yr: 0 0 0 0   Injury with Fall? 0 0 0 0   Risk for fall due to : Medication side effect      Follow up Falls evaluation completed;Education provided;Falls prevention discussed   Falls evaluation completed     FALL RISK PREVENTION PERTAINING TO THE HOME:  Any stairs in or around the home? Yes  If so, are there any without handrails? No  Home free of loose throw rugs in walkways, pet beds, electrical cords, etc? Yes  Adequate lighting in your home to reduce risk of falls? Yes   ASSISTIVE DEVICES UTILIZED TO PREVENT FALLS:  Life alert? No  Use of a cane, walker or w/c? No  Grab bars in the bathroom? No  Shower chair or  bench in shower? Yes  Elevated toilet seat or a handicapped toilet? Yes   TIMED UP AND GO:  Was the test performed? No .      Cognitive Function:        12/25/2021    3:18 PM  6CIT Screen  What Year? 0 points  What month? 0 points  What  time? 0 points  Count back from 20 0 points  Months in reverse 0 points  Repeat phrase 0 points  Total Score 0 points    Immunizations Immunization History  Administered Date(s) Administered   Fluad Quad(high Dose 65+) 11/08/2019, 12/19/2021   Influenza, High Dose Seasonal PF 12/01/2020   Influenza,inj,Quad PF,6+ Mos 11/02/2016, 10/27/2017, 10/19/2018   PFIZER Comirnaty(Gray Top)Covid-19 Tri-Sucrose Vaccine 08/07/2020   PFIZER(Purple Top)SARS-COV-2 Vaccination 03/14/2019, 04/14/2019, 11/27/2019   Pfizer Covid-19 Vaccine Bivalent Booster 90yr & up 12/09/2020   Pneumococcal Conjugate-13 12/08/2018   Pneumococcal Polysaccharide-23 12/11/2019   Td 02/22/1998, 11/26/2008   Unspecified SARS-COV-2 Vaccination 12/25/2021   Zoster Recombinat (Shingrix) 02/21/2021, 07/01/2021   Zoster, Live 10/29/2014    TDAP status: Due, Education has been provided regarding the importance of this vaccine. Advised may receive this vaccine at local pharmacy or Health Dept. Aware to provide a copy of the vaccination record if obtained from local pharmacy or Health Dept. Verbalized acceptance and understanding.  Flu Vaccine status: Up to date  Pneumococcal vaccine status: Up to date  Covid-19 vaccine status: Completed vaccines  Qualifies for Shingles Vaccine? Yes   Zostavax completed Yes   Shingrix Completed?: Yes  Screening Tests Health Maintenance  Topic Date Due   TETANUS/TDAP  11/27/2018   Medicare Annual Wellness (AWV)  12/23/2021   COVID-19 Vaccine (7 - Pfizer risk series) 02/19/2022   COLONOSCOPY (Pts 45-469yrInsurance coverage will need to be confirmed)  07/19/2022   DEXA SCAN  05/29/2023   MAMMOGRAM  07/31/2023   Pneumonia Vaccine 6567Years old  Completed   INFLUENZA VACCINE  Completed   Hepatitis C Screening  Completed   Zoster Vaccines- Shingrix  Completed   HPV VACCINES  Aged Out    Health Maintenance  Health Maintenance Due  Topic Date Due   TETANUS/TDAP  11/27/2018    Medicare Annual Wellness (AWV)  12/23/2021    Colorectal cancer screening: Type of screening: Colonoscopy. Completed 07/19/2019. Repeat every 3 years  Mammogram status: Completed 07/30/2021. Repeat every year  Bone Density status: Completed 05/28/2021.  Lung Cancer Screening: (Low Dose CT Chest recommended if Age 68-80ears, 30 pack-year currently smoking OR have quit w/in 15years.) does not qualify.   Lung Cancer Screening Referral: no  Additional Screening:  Hepatitis C Screening: does qualify; Completed 10/31/2015  Vision Screening: Recommended annual ophthalmology exams for early detection of glaucoma and other disorders of the eye. Is the patient up to date with their annual eye exam?  Yes  Who is the provider or what is the name of the office in which the patient attends annual eye exams? Dr. RiMarvel Planf pt is not established with a provider, would they like to be referred to a provider to establish care? No .   Dental Screening: Recommended annual dental exams for proper oral hygiene  Community Resource Referral / Chronic Care Management: CRR required this visit?  No   CCM required this visit?  No      Plan:     I have personally reviewed and noted the following in the patient's chart:   Medical and social history Use of alcohol,  tobacco or illicit drugs  Current medications and supplements including opioid prescriptions. Patient is not currently taking opioid prescriptions. Functional ability and status Nutritional status Physical activity Advanced directives List of other physicians Hospitalizations, surgeries, and ER visits in previous 12 months Vitals Screenings to include cognitive, depression, and falls Referrals and appointments  In addition, I have reviewed and discussed with patient certain preventive protocols, quality metrics, and best practice recommendations. A written personalized care plan for preventive services as well as general preventive health  recommendations were provided to patient.     Kellie Simmering, LPN   [id number]   Nurse Notes: none  Due to this being a virtual visit, the after visit summary with patients personalized plan was offered to patient via mail or my-chart.  Patient would like to access on my-chart

## 2021-12-25 NOTE — Patient Instructions (Signed)
[name] , Thank you for taking time to come for your Medicare Wellness Visit. I appreciate your ongoing commitment to your health goals. Please review the following plan we discussed and let me know if I can assist you in the future.   Screening recommendations/referrals: Colonoscopy: completed 07/19/2019, due 07/19/2022 Mammogram: completed 07/30/2021, due 08/01/2022 Bone Density: completed 05/28/2021 Recommended yearly ophthalmology/optometry visit for glaucoma screening and checkup Recommended yearly dental visit for hygiene and checkup  Vaccinations: Influenza vaccine: completed 12/19/2021 Pneumococcal vaccine: completed 12/11/2019 Tdap vaccine: due Shingles vaccine: completed   Covid-19: 12/25/2021, 12/09/2020, 08/07/2020, 11/27/2019, 04/14/2019, 03/14/2019  Advanced directives: copy in chart  Conditions/risks identified: none  Next appointment: Follow up in one year for your annual wellness visit    Preventive Care 3 Years and Older, Female Preventive care refers to lifestyle choices and visits with your health care provider that can promote health and wellness. What does preventive care include? A yearly physical exam. This is also called an annual well check. Dental exams once or twice a year. Routine eye exams. Ask your health care provider how often you should have your eyes checked. Personal lifestyle choices, including: Daily care of your teeth and gums. Regular physical activity. Eating a healthy diet. Avoiding tobacco and drug use. Limiting alcohol use. Practicing safe sex. Taking low-dose aspirin every day. Taking vitamin and mineral supplements as recommended by your health care provider. What happens during an annual well check? The services and screenings done by your health care provider during your annual well check will depend on your age, overall health, lifestyle risk factors, and family history of disease. Counseling  Your health care provider may ask you  questions about your: Alcohol use. Tobacco use. Drug use. Emotional well-being. Home and relationship well-being. Sexual activity. Eating habits. History of falls. Memory and ability to understand (cognition). Work and work Statistician. Reproductive health. Screening  You may have the following tests or measurements: Height, weight, and BMI. Blood pressure. Lipid and cholesterol levels. These may be checked every 5 years, or more frequently if you are over 26 years old. Skin check. Lung cancer screening. You may have this screening every year starting at age 77 if you have a 30-pack-year history of smoking and currently smoke or have quit within the past 15 years. Fecal occult blood test (FOBT) of the stool. You may have this test every year starting at age 67. Flexible sigmoidoscopy or colonoscopy. You may have a sigmoidoscopy every 5 years or a colonoscopy every 10 years starting at age 94. Hepatitis C blood test. Hepatitis B blood test. Sexually transmitted disease (STD) testing. Diabetes screening. This is done by checking your blood sugar (glucose) after you have not eaten for a while (fasting). You may have this done every 1-3 years. Bone density scan. This is done to screen for osteoporosis. You may have this done starting at age 42. Mammogram. This may be done every 1-2 years. Talk to your health care provider about how often you should have regular mammograms. Talk with your health care provider about your test results, treatment options, and if necessary, the need for more tests. Vaccines  Your health care provider may recommend certain vaccines, such as: Influenza vaccine. This is recommended every year. Tetanus, diphtheria, and acellular pertussis (Tdap, Td) vaccine. You may need a Td booster every 10 years. Zoster vaccine. You may need this after age 69. Pneumococcal 13-valent conjugate (PCV13) vaccine. One dose is recommended after age 53. Pneumococcal polysaccharide  (PPSV23) vaccine. One  dose is recommended after age 27. Talk to your health care provider about which screenings and vaccines you need and how often you need them. This information is not intended to replace advice given to you by your health care provider. Make sure you discuss any questions you have with your health care provider. Document Released: 03/07/2015 Document Revised: 10/29/2015 Document Reviewed: 12/10/2014 Elsevier Interactive Patient Education  2017 [place] Prevention in the Home Falls can cause injuries. They can happen to people of all ages. There are many things you can do to make your home safe and to help prevent falls. What can I do on the outside of my home? Regularly fix the edges of walkways and driveways and fix any cracks. Remove anything that might make you trip as you walk through a door, such as a raised step or threshold. Trim any bushes or trees on the path to your home. Use bright outdoor lighting. Clear any walking paths of anything that might make someone trip, such as rocks or tools. Regularly check to see if handrails are loose or broken. Make sure that both sides of any steps have handrails. Any raised decks and porches should have guardrails on the edges. Have any leaves, snow, or ice cleared regularly. Use sand or salt on walking paths during winter. Clean up any spills in your garage right away. This includes oil or grease spills. What can I do in the bathroom? Use night lights. Install grab bars by the toilet and in the tub and shower. Do not use towel bars as grab bars. Use non-skid mats or decals in the tub or shower. If you need to sit down in the shower, use a plastic, non-slip stool. Keep the floor dry. Clean up any water that spills on the floor as soon as it happens. Remove soap buildup in the tub or shower regularly. Attach bath mats securely with double-sided non-slip rug tape. Do not have throw rugs and other things on the  floor that can make you trip. What can I do in the bedroom? Use night lights. Make sure that you have a light by your bed that is easy to reach. Do not use any sheets or blankets that are too big for your bed. They should not hang down onto the floor. Have a firm chair that has side arms. You can use this for support while you get dressed. Do not have throw rugs and other things on the floor that can make you trip. What can I do in the kitchen? Clean up any spills right away. Avoid walking on wet floors. Keep items that you use a lot in easy-to-reach places. If you need to reach something above you, use a strong step stool that has a grab bar. Keep electrical cords out of the way. Do not use floor polish or wax that makes floors slippery. If you must use wax, use non-skid floor wax. Do not have throw rugs and other things on the floor that can make you trip. What can I do with my stairs? Do not leave any items on the stairs. Make sure that there are handrails on both sides of the stairs and use them. Fix handrails that are broken or loose. Make sure that handrails are as long as the stairways. Check any carpeting to make sure that it is firmly attached to the stairs. Fix any carpet that is loose or worn. Avoid having throw rugs at the top or bottom of the stairs.  If you do have throw rugs, attach them to the floor with carpet tape. Make sure that you have a light switch at the top of the stairs and the bottom of the stairs. If you do not have them, ask someone to add them for you. What else can I do to help prevent falls? Wear shoes that: Do not have high heels. Have rubber bottoms. Are comfortable and fit you well. Are closed at the toe. Do not wear sandals. If you use a stepladder: Make sure that it is fully opened. Do not climb a closed stepladder. Make sure that both sides of the stepladder are locked into place. Ask someone to hold it for you, if possible. Clearly mark and make  sure that you can see: Any grab bars or handrails. First and last steps. Where the edge of each step is. Use tools that help you move around (mobility aids) if they are needed. These include: Canes. Walkers. Scooters. Crutches. Turn on the lights when you go into a dark area. Replace any light bulbs as soon as they burn out. Set up your furniture so you have a clear path. Avoid moving your furniture around. If any of your floors are uneven, fix them. If there are any pets around you, be aware of where they are. Review your medicines with your doctor. Some medicines can make you feel dizzy. This can increase your chance of falling. Ask your doctor what other things that you can do to help prevent falls. This information is not intended to replace advice given to you by your health care provider. Make sure you discuss any questions you have with your health care provider. Document Released: 12/05/2008 Document Revised: 07/17/2015 Document Reviewed: 03/15/2014 Elsevier Interactive Patient Education  2017 [employer].

## 2021-12-30 ENCOUNTER — Ambulatory Visit (INDEPENDENT_AMBULATORY_CARE_PROVIDER_SITE_OTHER): Payer: Medicare HMO | Admitting: Family Medicine

## 2021-12-30 ENCOUNTER — Encounter: Payer: Self-pay | Admitting: Family Medicine

## 2021-12-30 VITALS — BP 136/82 | HR 70 | Temp 97.1°F | Ht 65.5 in | Wt 194.0 lb

## 2021-12-30 DIAGNOSIS — R209 Unspecified disturbances of skin sensation: Secondary | ICD-10-CM | POA: Diagnosis not present

## 2021-12-30 DIAGNOSIS — I10 Essential (primary) hypertension: Secondary | ICD-10-CM | POA: Diagnosis not present

## 2021-12-30 DIAGNOSIS — E7800 Pure hypercholesterolemia, unspecified: Secondary | ICD-10-CM

## 2021-12-30 DIAGNOSIS — Z0000 Encounter for general adult medical examination without abnormal findings: Secondary | ICD-10-CM | POA: Diagnosis not present

## 2021-12-30 MED ORDER — RAMIPRIL 5 MG PO CAPS: ORAL_CAPSULE | ORAL | 3 refills | Status: DC

## 2021-12-30 MED ORDER — MELOXICAM 15 MG PO TABS: 15.0000 mg | ORAL_TABLET | Freq: Every day | ORAL | 0 refills | Status: DC | PRN

## 2021-12-30 MED ORDER — ALENDRONATE SODIUM 70 MG PO TABS: ORAL_TABLET | ORAL | 3 refills | Status: DC

## 2021-12-30 MED ORDER — ATORVASTATIN CALCIUM 10 MG PO TABS: 10.0000 mg | ORAL_TABLET | Freq: Every day | ORAL | 3 refills | Status: DC

## 2021-12-30 NOTE — Assessment & Plan Note (Signed)
Acute, has noted left foot is colder than the right.  Home insurance evaluation performed ABI stating 70% blood flow in the left circulation on ABI. She is minimally symptomatic. We will start with repeating full ABI bilaterally.

## 2021-12-30 NOTE — Assessment & Plan Note (Signed)
Stable, chronic.  Continue current medication.  ramipril 5 mg p.o. daily

## 2021-12-30 NOTE — Progress Notes (Signed)
Patient ID: Melissa [name], female    DOB: 29-Jun-1955, 66 y.o.   MRN: [id number]  This visit was conducted in person.  BP 136/82   Pulse 70   Temp (!) 97.1 F (36.2 C) (Temporal)   Ht 5' 5.5" (1.664 m)   Wt 194 lb (88 kg)   SpO2 98%   BMI 31.79 kg/m    CC:  Chief Complaint  Patient presents with   Annual Exam    Circulation issues in left leg    Subjective:   HPI: Melissa [name] is a 66 y.o. female presenting on 12/30/2021 for  complete physical and review of chronic health problems. He/She also has the following acute concerns today:decreased circulation.   With [employer] had home health evaluation.The home BP readings have been in the ABI was decreased.  She has  noted left foot always colder than other.  Occ pain with rest, walking makes it better.No clear claudication. No personal or family history of PAD.    Hypertension:  Well-controlled on ramipril 5 mg p.o. daily  BP Readings from Last 3 Encounters:  12/30/21 136/82  12/23/20 138/82  08/04/20 120/80  Using medication without problems or lightheadedness:  none Chest pain with exertion: none Edema:none Short of breath:none Average home BPs: Other issues:  Elevated Cholesterol: LDL at goal on atorvastatin 10 mg p.o. daily Lab Results  Component Value Date   CHOL 123 12/23/2021   HDL 43.10 12/23/2021   LDLCALC 64 12/23/2021   TRIG 80.0 12/23/2021   CHOLHDL 3 12/23/2021  Using medications without problems: Muscle aches:  Diet compliance: heart health diet. Exercise: [employer] 3 days a week for treadmill and 2 more days of weights. Other complaints: Wt Readings from Last 3 Encounters:  12/30/21 194 lb (88 kg)  12/25/21 187 lb (84.8 kg)  12/23/20 190 lb (86.2 kg)    Flowsheet Row Clinical Support from 12/25/2021 in [facility] Total Score 0           Relevant past medical, surgical, family and social history reviewed and updated as indicated. Interim medical history  since our last visit reviewed. Allergies and medications reviewed and updated. Outpatient Medications Prior to Visit  Medication Sig Dispense Refill   alendronate (FOSAMAX) 70 MG tablet TAKE 1 TABLET BY MOUTH EVERY 7 (SEVEN) DAYS. TAKE WITH A FULL GLASS OF WATER ON AN EMPTY STOMACH. 12 tablet 3   atorvastatin (LIPITOR) 10 MG tablet TAKE 1 TABLET BY MOUTH EVERY DAY 30 tablet 0   bisacodyl (DULCOLAX) 5 MG EC tablet Take 5 mg by mouth daily as needed for moderate constipation.     calcium carbonate (TUMS - DOSED IN MG ELEMENTAL CALCIUM) 500 MG chewable tablet Chew 1 tablet by mouth 2 (two) times daily.      cholecalciferol (VITAMIN D) 1000 units tablet Take 1,000 Units by mouth daily.     diphenhydrAMINE HCl (BENADRYL PO) Take by mouth.     docusate sodium (COLACE) 100 MG capsule Take 100 mg by mouth daily as needed for mild constipation.     glucosamine-chondroitin 500-400 MG tablet Take 2 tablets by mouth daily.     meloxicam (MOBIC) 15 MG tablet Take 1 tablet (15 mg total) by mouth daily as needed for pain. 90 tablet 0   Multiple Vitamins-Minerals (PRESERVISION AREDS PO) Take by mouth.     ramipril (ALTACE) 5 MG capsule TAKE 1 CAPSULE BY MOUTH EVERY DAY IN THE MORNING 90 capsule 3   No  facility-administered medications prior to visit.     Per HPI unless specifically indicated in ROS section below Review of Systems  Constitutional:  Negative for fatigue and fever.  HENT:  Negative for congestion.   Eyes:  Negative for pain.  Respiratory:  Negative for cough and shortness of breath.   Cardiovascular:  Negative for chest pain, palpitations and leg swelling.  Gastrointestinal:  Negative for abdominal pain.  Genitourinary:  Negative for dysuria and vaginal bleeding.  Musculoskeletal:  Negative for back pain.  Neurological:  Negative for syncope, light-headedness and headaches.  Psychiatric/Behavioral:  Negative for dysphoric mood.    Objective:  BP 136/82   Pulse 70   Temp (!) 97.1 F  (36.2 C) (Temporal)   Ht 5' 5.5" (1.664 m)   Wt 194 lb (88 kg)   SpO2 98%   BMI 31.79 kg/m   Wt Readings from Last 3 Encounters:  12/30/21 194 lb (88 kg)  12/25/21 187 lb (84.8 kg)  12/23/20 190 lb (86.2 kg)      Physical Exam Vitals and nursing note reviewed.  Constitutional:      General: She is not in acute distress.    Appearance: Normal appearance. She is well-developed. She is not ill-appearing or toxic-appearing.  HENT:     Head: Normocephalic.     Right Ear: Hearing, tympanic membrane, ear canal and external ear normal.     Left Ear: Hearing, tympanic membrane, ear canal and external ear normal.     Nose: Nose normal.  Eyes:     General: Lids are normal. Lids are everted, no foreign bodies appreciated.     Conjunctiva/sclera: Conjunctivae normal.     Pupils: Pupils are equal, round, and reactive to light.  Neck:     Thyroid: No thyroid mass or thyromegaly.     Vascular: No carotid bruit.     Trachea: Trachea normal.  Cardiovascular:     Rate and Rhythm: Normal rate and regular rhythm.     Heart sounds: Normal heart sounds, S1 normal and S2 normal. No murmur heard.    No gallop.  Pulmonary:     Effort: Pulmonary effort is normal. No respiratory distress.     Breath sounds: Normal breath sounds. No wheezing, rhonchi or rales.  Abdominal:     General: Bowel sounds are normal. There is no distension or abdominal bruit.     Palpations: Abdomen is soft. There is no fluid wave or mass.     Tenderness: There is no abdominal tenderness. There is no guarding or rebound.     Hernia: No hernia is present.  Musculoskeletal:     Cervical back: Normal range of motion and neck supple.  Lymphadenopathy:     Cervical: No cervical adenopathy.  Skin:    General: Skin is warm and dry.     Findings: No rash.  Neurological:     Mental Status: She is alert.     Cranial Nerves: No cranial nerve deficit.     Sensory: No sensory deficit.  Psychiatric:        Mood and Affect: Mood  is not anxious or depressed.        Speech: Speech normal.        Behavior: Behavior normal. Behavior is cooperative.        Judgment: Judgment normal.       Results for orders placed or performed in visit on 12/23/21  VITAMIN D 25 Hydroxy (Vit-D Deficiency, Fractures)  Result Value Ref Range  VITD 35.33 30.00 - 100.00 ng/mL  Comprehensive metabolic panel  Result Value Ref Range   Sodium 139 135 - 145 mEq/L   Potassium 4.4 3.5 - 5.1 mEq/L   Chloride 103 96 - 112 mEq/L   CO2 28 19 - 32 mEq/L   Glucose, Bld 87 70 - 99 mg/dL   BUN 23 6 - 23 mg/dL   Creatinine, Ser 0.66 0.40 - 1.20 mg/dL   Total Bilirubin 0.6 0.2 - 1.2 mg/dL   Alkaline Phosphatase 55 39 - 117 U/L   AST 18 0 - 37 U/L   ALT 17 0 - 35 U/L   Total Protein 6.7 6.0 - 8.3 g/dL   Albumin 4.2 3.5 - 5.2 g/dL   GFR 90.30 >60.00 mL/min   Calcium 9.0 8.4 - 10.5 mg/dL  Lipid panel  Result Value Ref Range   Cholesterol 123 0 - 200 mg/dL   Triglycerides 80.0 0.0 - 149.0 mg/dL   HDL 43.10 >39.00 mg/dL   VLDL 16.0 0.0 - 40.0 mg/dL   LDL Cholesterol 64 0 - 99 mg/dL   Total CHOL/HDL Ratio 3    NonHDL 80.18      COVID 19 screen:  No recent travel or known exposure to COVID19 The patient denies respiratory symptoms of COVID 19 at this time. The importance of social distancing was discussed today.   Assessment and Plan The patient's preventative maintenance and recommended screening tests for an annual wellness exam were reviewed in full today. Brought up to date unless services declined.  Counselled on the importance of diet, exercise, and its role in overall health and mortality. The patient's FH and SH was reviewed, including their home life, tobacco status, and drug and alcohol status.    Need DVE yearly per pt.. No pap recommended. No vaginal issues, no lower abdominal pain. Vaccines: due for Td, uptodate  flu, PNA,  shingrix Colon: Dr. Fuller Plan. 07/2019 repeat 3 years Mammogram: 07/2021 3D mammo  nml... heterogenously  dense breasts.Marland Kitchen   DEXA: mother with osteoporosis, no other risk factors.. 2016 osteopenia  2020 osteoporosis started on fosamax.  05/2021 improved to osteopenia Nonsmoker Hep C screen:  done HIV: done   Problem List Items Addressed This Visit     Cold left foot    Acute, has noted left foot is colder than the right.  Home insurance evaluation performed ABI stating 70% blood flow in the left circulation on ABI. She is minimally symptomatic. We will start with repeating full ABI bilaterally.      Relevant Orders   VAS [place] ABI WITH/WO TBI   Essential hypertension, benign    Stable, chronic.  Continue current medication.  ramipril 5 mg p.o. daily      Relevant Medications   atorvastatin (LIPITOR) 10 MG tablet   ramipril (ALTACE) 5 MG capsule   High cholesterol    Stable, chronic.  Continue current medication.  Atorvastatin 10 mg p.o. daily      Relevant Medications   atorvastatin (LIPITOR) 10 MG tablet   ramipril (ALTACE) 5 MG capsule   Other Visit Diagnoses     Routine general medical examination at a health care facility    -  Primary       Meds ordered this encounter  Medications   alendronate (FOSAMAX) 70 MG tablet    Sig: TAKE 1 TABLET BY MOUTH EVERY 7 (SEVEN) DAYS. TAKE WITH A FULL GLASS OF WATER ON AN EMPTY STOMACH.    Dispense:  12 tablet  Refill:  3   atorvastatin (LIPITOR) 10 MG tablet    Sig: Take 1 tablet (10 mg total) by mouth daily.    Dispense:  90 tablet    Refill:  3   meloxicam (MOBIC) 15 MG tablet    Sig: Take 1 tablet (15 mg total) by mouth daily as needed for pain.    Dispense:  90 tablet    Refill:  0   ramipril (ALTACE) 5 MG capsule    Sig: TAKE 1 CAPSULE BY MOUTH EVERY DAY IN THE MORNING    Dispense:  90 capsule    Refill:  3     Eliezer Lofts, MD

## 2021-12-30 NOTE — Assessment & Plan Note (Signed)
Stable, chronic.  Continue current medication.  Atorvastatin 10 mg p.o. daily

## 2022-01-01 ENCOUNTER — Other Ambulatory Visit: Payer: Medicare HMO

## 2022-01-08 ENCOUNTER — Encounter: Payer: Medicare HMO | Admitting: Family Medicine

## 2022-02-22 HISTORY — PX: PARS PLANA VITRECTOMY W/ REPAIR OF MACULAR HOLE: SHX2170

## 2022-02-23 ENCOUNTER — Telehealth: Payer: Self-pay | Admitting: Family Medicine

## 2022-02-23 NOTE — Telephone Encounter (Signed)
Pt called in stated she got a message on Mychart was not sure what it was about . Requesting a call back if needed  # [phone]

## 2022-02-24 NOTE — Telephone Encounter (Signed)
Called patient it was in regards to u/s appointment. Patient has called office and verified that she still has for time/date. There is nothing further for our office to do at this time.

## 2022-03-04 ENCOUNTER — Ambulatory Visit: Payer: Medicare HMO | Attending: Family Medicine

## 2022-03-04 DIAGNOSIS — R209 Unspecified disturbances of skin sensation: Secondary | ICD-10-CM | POA: Diagnosis not present

## 2022-03-06 LAB — VAS US LOWER EXT ART SEG MULTI (SEGMENTALS & LE RAYNAUDS)
Left ABI: 1.25
Right ABI: 1.21

## 2022-03-17 DIAGNOSIS — H2513 Age-related nuclear cataract, bilateral: Secondary | ICD-10-CM | POA: Diagnosis not present

## 2022-03-17 DIAGNOSIS — H43813 Vitreous degeneration, bilateral: Secondary | ICD-10-CM | POA: Diagnosis not present

## 2022-03-17 DIAGNOSIS — H35342 Macular cyst, hole, or pseudohole, left eye: Secondary | ICD-10-CM | POA: Diagnosis not present

## 2022-03-17 DIAGNOSIS — H35363 Drusen (degenerative) of macula, bilateral: Secondary | ICD-10-CM | POA: Diagnosis not present

## 2022-03-22 DIAGNOSIS — H35342 Macular cyst, hole, or pseudohole, left eye: Secondary | ICD-10-CM | POA: Diagnosis not present

## 2022-03-30 DIAGNOSIS — H25043 Posterior subcapsular polar age-related cataract, bilateral: Secondary | ICD-10-CM | POA: Diagnosis not present

## 2022-03-30 DIAGNOSIS — H353131 Nonexudative age-related macular degeneration, bilateral, early dry stage: Secondary | ICD-10-CM | POA: Diagnosis not present

## 2022-03-30 DIAGNOSIS — H2513 Age-related nuclear cataract, bilateral: Secondary | ICD-10-CM | POA: Diagnosis not present

## 2022-03-30 DIAGNOSIS — H18413 Arcus senilis, bilateral: Secondary | ICD-10-CM | POA: Diagnosis not present

## 2022-03-30 DIAGNOSIS — H35342 Macular cyst, hole, or pseudohole, left eye: Secondary | ICD-10-CM | POA: Diagnosis not present

## 2022-03-30 DIAGNOSIS — H2512 Age-related nuclear cataract, left eye: Secondary | ICD-10-CM | POA: Diagnosis not present

## 2022-04-05 DIAGNOSIS — H2512 Age-related nuclear cataract, left eye: Secondary | ICD-10-CM | POA: Diagnosis not present

## 2022-04-05 DIAGNOSIS — H35342 Macular cyst, hole, or pseudohole, left eye: Secondary | ICD-10-CM | POA: Diagnosis not present

## 2022-04-16 ENCOUNTER — Other Ambulatory Visit: Payer: Self-pay | Admitting: Family Medicine

## 2022-04-16 NOTE — Telephone Encounter (Signed)
Last office visit 12/30/21 for CPE.  Last refilled 12/30/21 for #90 with no refills.  Next Appt: 01/07/23 for CPE.

## 2022-05-11 DIAGNOSIS — H35342 Macular cyst, hole, or pseudohole, left eye: Secondary | ICD-10-CM | POA: Diagnosis not present

## 2022-06-21 ENCOUNTER — Other Ambulatory Visit: Payer: Self-pay | Admitting: Family Medicine

## 2022-06-21 DIAGNOSIS — Z1231 Encounter for screening mammogram for malignant neoplasm of breast: Secondary | ICD-10-CM

## 2022-07-08 DIAGNOSIS — Z0100 Encounter for examination of eyes and vision without abnormal findings: Secondary | ICD-10-CM | POA: Diagnosis not present

## 2022-08-02 ENCOUNTER — Ambulatory Visit
Admission: RE | Admit: 2022-08-02 | Discharge: 2022-08-02 | Disposition: A | Discharge status: Home / Self Care | Payer: Medicare HMO | Source: Ambulatory Visit | Attending: Family Medicine | Admitting: Family Medicine

## 2022-08-02 DIAGNOSIS — Z1231 Encounter for screening mammogram for malignant neoplasm of breast: Secondary | ICD-10-CM

## 2022-08-19 DIAGNOSIS — H524 Presbyopia: Secondary | ICD-10-CM | POA: Diagnosis not present

## 2022-12-05 ENCOUNTER — Other Ambulatory Visit: Payer: Self-pay | Admitting: Family Medicine

## 2022-12-16 ENCOUNTER — Telehealth: Payer: Self-pay | Admitting: *Deleted

## 2022-12-16 DIAGNOSIS — E7800 Pure hypercholesterolemia, unspecified: Secondary | ICD-10-CM

## 2022-12-16 DIAGNOSIS — E559 Vitamin D deficiency, unspecified: Secondary | ICD-10-CM

## 2022-12-16 NOTE — Telephone Encounter (Signed)
-----   Message from Alvina Chou sent at 12/16/2022 11:51 AM EDT ----- Regarding: Lab orders for Fri, 11.8.24 Patient is scheduled for CPX labs, please order future labs, Thanks , Camelia Eng

## 2022-12-31 ENCOUNTER — Other Ambulatory Visit: Payer: Medicare HMO

## 2023-01-03 ENCOUNTER — Other Ambulatory Visit (INDEPENDENT_AMBULATORY_CARE_PROVIDER_SITE_OTHER): Payer: Medicare HMO

## 2023-01-03 DIAGNOSIS — E7800 Pure hypercholesterolemia, unspecified: Secondary | ICD-10-CM

## 2023-01-03 DIAGNOSIS — E559 Vitamin D deficiency, unspecified: Secondary | ICD-10-CM | POA: Diagnosis not present

## 2023-01-03 LAB — LIPID PANEL
Cholesterol: 112 mg/dL (ref 0–200)
HDL: 45.6 mg/dL (ref >39.00)
LDL Cholesterol: 53 mg/dL (ref 0–99)
NonHDL: 66.62
Total CHOL/HDL Ratio: 2
Triglycerides: 67 mg/dL (ref 0.0–149.0)
VLDL: 13.4 mg/dL (ref 0.0–40.0)

## 2023-01-03 LAB — COMPREHENSIVE METABOLIC PANEL
ALT: 12 U/L (ref 0–35)
AST: 17 U/L (ref 0–37)
Albumin: 4.2 g/dL (ref 3.5–5.2)
Alkaline Phosphatase: 58 U/L (ref 39–117)
BUN: 14 mg/dL (ref 6–23)
CO2: 29 meq/L (ref 19–32)
Calcium: 9.3 mg/dL (ref 8.4–10.5)
Chloride: 104 meq/L (ref 96–112)
Creatinine, Ser: 0.69 mg/dL (ref 0.40–1.20)
GFR: 88.69 mL/min (ref >60.00)
Glucose, Bld: 85 mg/dL (ref 70–99)
Potassium: 4.4 meq/L (ref 3.5–5.1)
Sodium: 140 meq/L (ref 135–145)
Total Bilirubin: 0.7 mg/dL (ref 0.2–1.2)
Total Protein: 7.1 g/dL (ref 6.0–8.3)

## 2023-01-03 LAB — VITAMIN D 25 HYDROXY (VIT D DEFICIENCY, FRACTURES): VITD: 58.77 ng/mL (ref 30.00–100.00)

## 2023-01-04 ENCOUNTER — Ambulatory Visit (INDEPENDENT_AMBULATORY_CARE_PROVIDER_SITE_OTHER): Payer: Medicare HMO

## 2023-01-04 VITALS — Wt 194.0 lb

## 2023-01-04 DIAGNOSIS — Z1211 Encounter for screening for malignant neoplasm of colon: Secondary | ICD-10-CM | POA: Diagnosis not present

## 2023-01-04 DIAGNOSIS — Z0000 Encounter for general adult medical examination without abnormal findings: Secondary | ICD-10-CM

## 2023-01-04 NOTE — Patient Instructions (Signed)
[name] , Thank you for taking time to come for your Medicare Wellness Visit. I appreciate your ongoing commitment to your health goals. Please review the following plan we discussed and let me know if I can assist you in the future.   Referrals/Orders/Follow-Ups/Clinician Recommendations: Aim for 30 minutes of exercise or brisk walking, 6-8 glasses of water, and 5 servings of fruits and vegetables each day. Continue to work on losing weight    This is a list of the screening recommended for you and due dates:  Health Maintenance  Topic Date Due   DTaP/Tdap/Td vaccine (3 - Tdap) 11/27/2018   Colon Cancer Screening  07/19/2022   COVID-19 Vaccine (8 - 2023-24 season) 10/24/2022   DEXA scan (bone density measurement)  05/29/2023   Medicare Annual Wellness Visit  01/04/2024   Mammogram  08/01/2024   Pneumonia Vaccine  Completed   Flu Shot  Completed   Hepatitis C Screening  Completed   Zoster (Shingles) Vaccine  Completed   HPV Vaccine  Aged Out    Advanced directives: (In Chart) A copy of your advanced directives are scanned into your chart should your provider ever need it.  Next Medicare Annual Wellness Visit scheduled for next year: Yes

## 2023-01-04 NOTE — Progress Notes (Signed)
No critical labs need to be addressed urgently. We will discuss labs in detail at upcoming office visit.   

## 2023-01-04 NOTE — Progress Notes (Signed)
Subjective:   Melissa [name] is a 67 y.o. female who presents for Medicare Annual (Subsequent) preventive examination.  Visit Complete: Virtual I connected with  [name] on 01/04/23 by a audio enabled telemedicine application and verified that I am speaking with the correct person using two identifiers.  Patient Location: Home  Provider Location: Office/Clinic  I discussed the limitations of evaluation and management by telemedicine. The patient expressed understanding and agreed to proceed.  Vital Signs: Because this visit was a virtual/telehealth visit, some criteria may be missing or patient reported. Any vitals not documented were not able to be obtained and vitals that have been documented are patient reported.  Patient Medicare AWV questionnaire was completed by the patient on 01/03/23; I have confirmed that all information answered by patient is correct and no changes since this date.  Cardiac Risk Factors include: advanced age (>22men, >6 women);dyslipidemia;hypertension;obesity (BMI >30kg/m2)     Objective:    Today's Vitals   01/04/23 1334  Weight: 194 lb (88 kg)   Body mass index is 31.79 kg/m.     01/04/2023    1:40 PM 12/25/2021    3:16 PM 05/17/2019   11:36 AM 11/16/2017   11:28 AM 11/16/2017   11:04 AM 11/11/2017    1:09 PM 11/24/2016    9:43 AM  Advanced Directives  Does Patient Have a Medical Advance Directive? Yes Yes No;Yes  Yes Yes Yes  Type of Estate agent of [place];Living will Healthcare Power of [place];Living will Healthcare Power of [place];Living will  Healthcare Power of [place];Living will  Healthcare Power of [place];Living will  Does patient want to make changes to medical advance directive? No - Patient declined        Copy of Healthcare Power of Attorney in Chart? Yes - validated most recent copy scanned in chart (See row information) Yes - validated most recent copy scanned in chart (See row  information)  Yes No - copy requested  No - copy requested    Current Medications (verified) Outpatient Encounter Medications as of 01/04/2023  Medication Sig   alendronate (FOSAMAX) 70 MG tablet TAKE 1 TABLET BY MOUTH EVERY 7 (SEVEN) DAYS. TAKE WITH A FULL GLASS OF WATER ON AN EMPTY STOMACH.   atorvastatin (LIPITOR) 10 MG tablet TAKE 1 TABLET BY MOUTH EVERY DAY   bisacodyl (DULCOLAX) 5 MG EC tablet Take 5 mg by mouth daily as needed for moderate constipation.   calcium carbonate (TUMS - DOSED IN MG ELEMENTAL CALCIUM) 500 MG chewable tablet Chew 1 tablet by mouth 2 (two) times daily.    cholecalciferol (VITAMIN D) 1000 units tablet Take 1,000 Units by mouth daily.   COMIRNATY syringe    diphenhydrAMINE HCl (BENADRYL PO) Take by mouth.   docusate sodium (COLACE) 100 MG capsule Take 100 mg by mouth daily as needed for mild constipation.   glucosamine-chondroitin 500-400 MG tablet Take 2 tablets by mouth daily.   meloxicam (MOBIC) 15 MG tablet TAKE 1 TABLET BY MOUTH EVERY DAY AS NEEDED FOR PAIN   Multiple Vitamins-Minerals (PRESERVISION AREDS PO) Take by mouth.   ramipril (ALTACE) 5 MG capsule TAKE 1 CAPSULE BY MOUTH EVERY DAY IN THE MORNING   No facility-administered encounter medications on file as of 01/04/2023.    Allergies (verified) Patient has no known allergies.   History: Past Medical History:  Diagnosis Date   Allergy    seasonal   Cancer of uterine body (HCC) dx'd 04/14/10   radical hysterectomy, chemotherapy and radiation therapy  Chronic cholecystitis    Family history of colon cancer    Family history of ovarian cancer    Family history of pancreatic cancer    Hypertension    Osteoarthritis    HX OF SOME  OSTEOARTHRITIS   Past Surgical History:  Procedure Laterality Date   ABDOMINAL HYSTERECTOMY  05/08/2010   LAPAROSCOPIC ROBOTIC ASSISSTED TOTAL HYSTERECTOMY WITH BSO , BILAT. PELVIC AND PERIAORTIC LYMPH NODE EVAL. BY DR. Duard Brady AT [facility]   APPENDECTOMY     March  2021   CESAREAN SECTION  01/08/80 11/21/82   x2   CHOLECYSTECTOMY N/A 11/16/2017   Procedure: LAPAROSCOPIC CHOLECYSTECTOMY;  Surgeon: Ancil Linsey, MD;  Location: [facility] ORS;  Service: General;  Laterality: N/A;   COLONOSCOPY  [id number]   EYE SURGERY     LAPAROSCOPIC APPENDECTOMY N/A 05/17/2019   Procedure: APPENDECTOMY LAPAROSCOPIC;  Surgeon: Carolan Shiver, MD;  Location: [facility] ORS;  Service: General;  Laterality: N/A;   PARS PLANA VITRECTOMY W/ REPAIR OF MACULAR HOLE Left 2024   per pt and removed cataract   Family History  Problem Relation Age of Onset   Colon cancer Maternal Grandfather        70's   Pancreatic cancer Father        23s.    Colon polyps Father    Diabetes Brother    Diabetes Brother    Thyroid cancer Maternal Aunt 72   Ovarian cancer Maternal Aunt 25   Other Maternal Grandmother        complications of childbirth   Parkinson's disease Maternal Aunt    Esophageal cancer Neg Hx    Rectal cancer Neg Hx    Stomach cancer Neg Hx    Social History   Socioeconomic History   Marital status: Married    Spouse name: Not on file   Number of children: Not on file   Years of education: Not on file   Highest education level: Not on file  Occupational History   Not on file  Tobacco Use   Smoking status: Never   Smokeless tobacco: Never  Vaping Use   Vaping status: Never Used  Substance and Sexual Activity   Alcohol use: Yes    Comment: Occas-holidays   Drug use: No   Sexual activity: Never  Other Topics Concern   Not on file  Social History Narrative   exercsie at [facility] 4-6 times a week.;  Diet: good;      In [place]; with husband; 2 sons; never smoked; seldom alcohol.    Social Determinants of Health   Financial Resource Strain: Low Risk  (01/03/2023)   Overall Financial Resource Strain (CARDIA)    Difficulty of Paying Living Expenses: Not hard at all  Food Insecurity: No Food Insecurity (01/03/2023)   Hunger Vital Sign    Worried  About Running Out of Food in the Last Year: Never true    Ran Out of Food in the Last Year: Never true  Transportation Needs: No Transportation Needs (01/03/2023)   PRAPARE - Administrator, Civil Service (Medical): No    Lack of Transportation (Non-Medical): No  Physical Activity: Sufficiently Active (12/25/2021)   Exercise Vital Sign    Days of Exercise per Week: 3 days    Minutes of Exercise per Session: 60 min  Stress: Stress Concern Present (01/03/2023)   [employer] of Occupational Health - Occupational Stress Questionnaire    Feeling of Stress : To some extent  Social Connections: Moderately Integrated (01/03/2023)  Social Advertising account executive [NHANES]    Frequency of Communication with Friends and Family: Never    Frequency of Social Gatherings with Friends and Family: Once a week    Attends Religious Services: More than 4 times per year    Active Member of [employer] or Organizations: Yes    Attends Engineer, structural: More than 4 times per year    Marital Status: Married    Tobacco Counseling Counseling given: Not Answered   Clinical Intake:  Pre-visit preparation completed: Yes  Pain : No/denies pain     Nutritional Status: BMI > 30  Obese  How often do you need to have someone help you when you read instructions, pamphlets, or other written materials from your doctor or pharmacy?: 1 - Never  Interpreter Needed?: No  Information entered by :: Lanier Ensign, LPN   Activities of Daily Living    01/03/2023   12:33 PM  In your present state of health, do you have any difficulty performing the following activities:  Hearing? 0  Vision? 0  Difficulty concentrating or making decisions? 0  Walking or climbing stairs? 0  Dressing or bathing? 0  Doing errands, shopping? 0  Preparing Food and eating ? N  Using the Toilet? N  In the past six months, have you accidently leaked urine? Y  Comment at times  Do you have problems  with loss of bowel control? N  Managing your Medications? N  Managing your Finances? N  Housekeeping or managing your Housekeeping? N    Patient Care Team: Excell Seltzer, MD as PCP - General (Family Medicine) Cleda Mccreedy, MD as Consulting Physician (Gynecologic Oncology) Ancil Linsey, MD (Inactive) (General Surgery) Meryl Dare, MD as Consulting Physician (Gastroenterology) Benita Gutter, RN as Oncology Nurse Navigator  Indicate any recent Medical Services you may have received from other than [facility] providers in the past year (date may be approximate).     Assessment:   This is a routine wellness examination for [place].  Hearing/Vision screen Hearing Screening - Comments:: Pt denies any hearing issues  Vision Screening - Comments:: Pt follows up with Dr Senaida Ores for annual eye exams    Goals Addressed             This Visit's Progress    Patient Stated       Lose weight        Depression Screen    01/04/2023    1:38 PM 12/25/2021    3:17 PM 12/23/2020   10:34 AM 12/11/2019    3:28 PM 12/08/2018   11:53 AM 12/02/2017    2:06 PM 11/02/2016   11:53 AM  PHQ 2/9 Scores  PHQ - 2 Score 1 0 0 2 2 0 2  PHQ- 9 Score    6 6  7     Fall Risk    01/03/2023   12:33 PM 12/25/2021    3:17 PM 12/23/2021   10:03 AM 12/23/2020   10:34 AM 12/11/2019    3:28 PM  Fall Risk   Falls in the past year? 0 0 0 1 0  Number falls in past yr: 0 0 0 0 0  Injury with Fall? 0 0 0 0 0  Risk for fall due to : Impaired vision Medication side effect     Follow up Falls prevention discussed Falls evaluation completed;Education provided;Falls prevention discussed   Falls evaluation completed    MEDICARE RISK AT HOME: Medicare Risk at Home Any  stairs in or around the home?: Yes If so, are there any without handrails?: No Home free of loose throw rugs in walkways, pet beds, electrical cords, etc?: No Adequate lighting in your home to reduce risk of falls?: Yes Life alert?:  No Use of a cane, walker or w/c?: No Grab bars in the bathroom?: No Shower chair or bench in shower?: Yes Elevated toilet seat or a handicapped toilet?: Yes  TIMED UP AND GO:  Was the test performed?  No    Cognitive Function:        01/04/2023    1:40 PM 12/25/2021    3:18 PM  6CIT Screen  What Year? 0 points 0 points  What month? 0 points 0 points  What time? 0 points 0 points  Count back from 20 0 points 0 points  Months in reverse 0 points 0 points  Repeat phrase 0 points 0 points  Total Score 0 points 0 points    Immunizations Immunization History  Administered Date(s) Administered   Fluad Quad(high Dose 65+) 11/08/2019, 12/19/2021   Influenza, High Dose Seasonal PF 12/01/2020   Influenza,inj,Quad PF,6+ Mos 11/02/2016, 10/27/2017, 10/19/2018   Influenza-Unspecified 01/03/2023   PFIZER Comirnaty(Gray Top)Covid-19 Tri-Sucrose Vaccine 08/07/2020   PFIZER(Purple Top)SARS-COV-2 Vaccination 03/14/2019, 04/14/2019, 11/27/2019   Pfizer Covid-19 Vaccine Bivalent Booster 75yrs & up 12/09/2020   Pfizer(Comirnaty)Fall Seasonal Vaccine 12 years and older 12/25/2021, 08/18/2022   Pneumococcal Conjugate-13 12/08/2018   Pneumococcal Polysaccharide-23 12/11/2019   Respiratory Syncytial Virus Vaccine,Recomb Aduvanted(Arexvy) 01/30/2022   Td 02/22/1998, 11/26/2008   Unspecified SARS-COV-2 Vaccination 12/25/2021   Zoster Recombinant(Shingrix) 02/21/2021, 07/01/2021   Zoster, Live 10/29/2014    TDAP status: Due, Education has been provided regarding the importance of this vaccine. Advised may receive this vaccine at local pharmacy or Health Dept. Aware to provide a copy of the vaccination record if obtained from local pharmacy or Health Dept. Verbalized acceptance and understanding.  Flu Vaccine status: Up to date  Pneumococcal vaccine status: Up to date  Covid-19 vaccine status: Information provided on how to obtain vaccines.   Qualifies for Shingles Vaccine? Yes   Zostavax  completed Yes   Shingrix Completed?: Yes  Screening Tests Health Maintenance  Topic Date Due   DTaP/Tdap/Td (3 - Tdap) 11/27/2018   Colonoscopy  07/19/2022   COVID-19 Vaccine (8 - 2023-24 season) 10/24/2022   DEXA SCAN  05/29/2023   Medicare Annual Wellness (AWV)  01/04/2024   MAMMOGRAM  08/01/2024   Pneumonia Vaccine 40+ Years old  Completed   INFLUENZA VACCINE  Completed   Hepatitis C Screening  Completed   Zoster Vaccines- Shingrix  Completed   HPV VACCINES  Aged Out    Health Maintenance  Health Maintenance Due  Topic Date Due   DTaP/Tdap/Td (3 - Tdap) 11/27/2018   Colonoscopy  07/19/2022   COVID-19 Vaccine (8 - 2023-24 season) 10/24/2022    Colorectal cancer screening: Referral to GI placed 01/04/23. Pt aware the office will call re: appt.  Mammogram status: Completed 08/02/22. Repeat every year  Bone Density status: Completed 05/28/21. Results reflect: Bone density results: OSTEOPENIA. Repeat every 2 years.  Additional Screening:  Hepatitis C Screening:  Completed 09/30/15  Vision Screening: Recommended annual ophthalmology exams for early detection of glaucoma and other disorders of the eye. Is the patient up to date with their annual eye exam?  Yes  Who is the provider or what is the name of the office in which the patient attends annual eye exams? Dr Senaida Ores  If pt  is not established with a provider, would they like to be referred to a provider to establish care? No .   Dental Screening: Recommended annual dental exams for proper oral hygiene   Community Resource Referral / Chronic Care Management: CRR required this visit?  No   CCM required this visit?  No     Plan:     I have personally reviewed and noted the following in the patient's chart:   Medical and social history Use of alcohol, tobacco or illicit drugs  Current medications and supplements including opioid prescriptions. Patient is not currently taking opioid prescriptions. Functional  ability and status Nutritional status Physical activity Advanced directives List of other physicians Hospitalizations, surgeries, and ER visits in previous 12 months Vitals Screenings to include cognitive, depression, and falls Referrals and appointments  In addition, I have reviewed and discussed with patient certain preventive protocols, quality metrics, and best practice recommendations. A written personalized care plan for preventive services as well as general preventive health recommendations were provided to patient.     Marzella Schlein, LPN   44/02/270   After Visit Summary: (MyChart) Due to this being a telephonic visit, the after visit summary with patients personalized plan was offered to patient via MyChart   Nurse Notes: none

## 2023-01-07 ENCOUNTER — Ambulatory Visit (INDEPENDENT_AMBULATORY_CARE_PROVIDER_SITE_OTHER): Payer: Medicare HMO | Admitting: Family Medicine

## 2023-01-07 ENCOUNTER — Encounter: Payer: Self-pay | Admitting: Family Medicine

## 2023-01-07 VITALS — BP 110/70 | HR 71 | Temp 98.8°F | Ht 65.5 in | Wt 190.1 lb

## 2023-01-07 DIAGNOSIS — I10 Essential (primary) hypertension: Secondary | ICD-10-CM

## 2023-01-07 DIAGNOSIS — M816 Localized osteoporosis [Lequesne]: Secondary | ICD-10-CM

## 2023-01-07 DIAGNOSIS — Z0000 Encounter for general adult medical examination without abnormal findings: Secondary | ICD-10-CM

## 2023-01-07 DIAGNOSIS — E7800 Pure hypercholesterolemia, unspecified: Secondary | ICD-10-CM | POA: Diagnosis not present

## 2023-01-07 NOTE — Assessment & Plan Note (Addendum)
Chronic, on Fosamax since 2020 , stop fosamax  2024 ( when out) and with repeat bone density in 2025.

## 2023-01-07 NOTE — Progress Notes (Signed)
Patient ID: Melissa [name], female    DOB: Jun 04, 1955, 67 y.o.   MRN: [id number]  This visit was conducted in person.  BP 110/70 (BP Location: Left Arm, Patient Position: Sitting, Cuff Size: Large)   Pulse 71   Temp 98.8 F (37.1 C) (Temporal)   Ht 5' 5.5" (1.664 m)   Wt 190 lb 2 oz (86.2 kg)   SpO2 98%   BMI 31.16 kg/m    CC:  Chief Complaint  Patient presents with   Annual Exam    Part 2    Subjective:   HPI: The patient presents for  complete physical and review of chronic health problems. He/She also has the following acute concerns today: none  Hypertension:  Well-controlled on ramipril 5 mg p.o. daily  BP Readings from Last 3 Encounters:  01/07/23 110/70  12/30/21 136/82  12/23/20 138/82  Using medication without problems or lightheadedness:  none Chest pain with exertion: none Edema:none Short of breath:none Average home BPs: Other issues:  Elevated Cholesterol: LDL at goal on atorvastatin 10 mg p.o. daily Lab Results  Component Value Date   CHOL 112 01/03/2023   HDL 45.60 01/03/2023   LDLCALC 53 01/03/2023   TRIG 67.0 01/03/2023   CHOLHDL 2 01/03/2023  Using medications without problems:none Muscle aches:  none Diet compliance: heart health diet. Exercise: [employer] 3 days a week for  4 miles treadmill and 2 more days of weights. Other complaints: Wt Readings from Last 3 Encounters:  01/07/23 190 lb 2 oz (86.2 kg)  01/04/23 194 lb (88 kg)  12/30/21 194 lb (88 kg)    Flowsheet Row Clinical Support from 01/04/2023 in [facility] Total Score 1           Relevant past medical, surgical, family and social history reviewed and updated as indicated. Interim medical history since our last visit reviewed. Allergies and medications reviewed and updated. Outpatient Medications Prior to Visit  Medication Sig Dispense Refill   atorvastatin (LIPITOR) 10 MG tablet TAKE 1 TABLET BY MOUTH EVERY DAY 90 tablet 0    bisacodyl (DULCOLAX) 5 MG EC tablet Take 5 mg by mouth daily as needed for moderate constipation.     calcium carbonate (TUMS - DOSED IN MG ELEMENTAL CALCIUM) 500 MG chewable tablet Chew 1 tablet by mouth 2 (two) times daily.      cholecalciferol (VITAMIN D) 1000 units tablet Take 1,000 Units by mouth daily.     diphenhydrAMINE HCl (BENADRYL PO) Take by mouth.     docusate sodium (COLACE) 100 MG capsule Take 100 mg by mouth daily as needed for mild constipation.     glucosamine-chondroitin 500-400 MG tablet Take 2 tablets by mouth daily.     meloxicam (MOBIC) 15 MG tablet TAKE 1 TABLET BY MOUTH EVERY DAY AS NEEDED FOR PAIN 90 tablet 0   Multiple Vitamins-Minerals (PRESERVISION AREDS PO) Take by mouth.     ramipril (ALTACE) 5 MG capsule TAKE 1 CAPSULE BY MOUTH EVERY DAY IN THE MORNING 90 capsule 0   alendronate (FOSAMAX) 70 MG tablet TAKE 1 TABLET BY MOUTH EVERY 7 (SEVEN) DAYS. TAKE WITH A FULL GLASS OF WATER ON AN EMPTY STOMACH. 12 tablet 3   COMIRNATY syringe      No facility-administered medications prior to visit.     Per HPI unless specifically indicated in ROS section below Review of Systems  Constitutional:  Negative for fatigue and fever.  HENT:  Negative for congestion.  Eyes:  Negative for pain.  Respiratory:  Negative for cough and shortness of breath.   Cardiovascular:  Negative for chest pain, palpitations and leg swelling.  Gastrointestinal:  Negative for abdominal pain.  Genitourinary:  Negative for dysuria and vaginal bleeding.  Musculoskeletal:  Negative for back pain.  Neurological:  Negative for syncope, light-headedness and headaches.  Psychiatric/Behavioral:  Negative for dysphoric mood.    Objective:  BP 110/70 (BP Location: Left Arm, Patient Position: Sitting, Cuff Size: Large)   Pulse 71   Temp 98.8 F (37.1 C) (Temporal)   Ht 5' 5.5" (1.664 m)   Wt 190 lb 2 oz (86.2 kg)   SpO2 98%   BMI 31.16 kg/m   Wt Readings from Last 3 Encounters:  01/07/23 190 lb 2  oz (86.2 kg)  01/04/23 194 lb (88 kg)  12/30/21 194 lb (88 kg)      Physical Exam Vitals and nursing note reviewed.  Constitutional:      General: She is not in acute distress.    Appearance: Normal appearance. She is well-developed. She is not ill-appearing or toxic-appearing.  HENT:     Head: Normocephalic.     Right Ear: Hearing, tympanic membrane, ear canal and external ear normal.     Left Ear: Hearing, tympanic membrane, ear canal and external ear normal.     Nose: Nose normal.  Eyes:     General: Lids are normal. Lids are everted, no foreign bodies appreciated.     Conjunctiva/sclera: Conjunctivae normal.     Pupils: Pupils are equal, round, and reactive to light.  Neck:     Thyroid: No thyroid mass or thyromegaly.     Vascular: No carotid bruit.     Trachea: Trachea normal.  Cardiovascular:     Rate and Rhythm: Normal rate and regular rhythm.     Heart sounds: Normal heart sounds, S1 normal and S2 normal. No murmur heard.    No gallop.  Pulmonary:     Effort: Pulmonary effort is normal. No respiratory distress.     Breath sounds: Normal breath sounds. No wheezing, rhonchi or rales.  Abdominal:     General: Bowel sounds are normal. There is no distension or abdominal bruit.     Palpations: Abdomen is soft. There is no fluid wave or mass.     Tenderness: There is no abdominal tenderness. There is no guarding or rebound.     Hernia: No hernia is present.  Musculoskeletal:     Cervical back: Normal range of motion and neck supple.  Lymphadenopathy:     Cervical: No cervical adenopathy.  Skin:    General: Skin is warm and dry.     Findings: No rash.  Neurological:     Mental Status: She is alert.     Cranial Nerves: No cranial nerve deficit.     Sensory: No sensory deficit.  Psychiatric:        Mood and Affect: Mood is not anxious or depressed.        Speech: Speech normal.        Behavior: Behavior normal. Behavior is cooperative.        Judgment: Judgment  normal.       Results for orders placed or performed in visit on 01/03/23  VITAMIN D 25 Hydroxy (Vit-D Deficiency, Fractures)  Result Value Ref Range   VITD 58.77 30.00 - 100.00 ng/mL  Lipid panel  Result Value Ref Range   Cholesterol 112 0 - 200 mg/dL  Triglycerides 67.0 0.0 - 149.0 mg/dL   HDL 16.10 >96.04 mg/dL   VLDL 54.0 0.0 - 98.1 mg/dL   LDL Cholesterol 53 0 - 99 mg/dL   Total CHOL/HDL Ratio 2    NonHDL 66.62   Comprehensive metabolic panel  Result Value Ref Range   Sodium 140 135 - 145 mEq/L   Potassium 4.4 3.5 - 5.1 mEq/L   Chloride 104 96 - 112 mEq/L   CO2 29 19 - 32 mEq/L   Glucose, Bld 85 70 - 99 mg/dL   BUN 14 6 - 23 mg/dL   Creatinine, Ser 1.91 0.40 - 1.20 mg/dL   Total Bilirubin 0.7 0.2 - 1.2 mg/dL   Alkaline Phosphatase 58 39 - 117 U/L   AST 17 0 - 37 U/L   ALT 12 0 - 35 U/L   Total Protein 7.1 6.0 - 8.3 g/dL   Albumin 4.2 3.5 - 5.2 g/dL   GFR 47.82 >95.62 mL/min   Calcium 9.3 8.4 - 10.5 mg/dL     COVID 19 screen:  No recent travel or known exposure to COVID19 The patient denies respiratory symptoms of COVID 19 at this time. The importance of social distancing was discussed today.   Assessment and Plan The patient's preventative maintenance and recommended screening tests for an annual wellness exam were reviewed in full today. Brought up to date unless services declined.  Counselled on the importance of diet, exercise, and its role in overall health and mortality. The patient's FH and SH was reviewed, including their home life, tobacco status, and drug and alcohol status.    Need DVE yearly per pt.. No pap recommended. No vaginal issues, no lower abdominal pain. Vaccines: due for Td, uptodate  flu, PNA,  shingrix Colon: Dr. Russella Dar. 07/2019 repeat 3 years..  Repeat due Mammogram: 07/2022 DEXA: mother with osteoporosis, no other risk factors.. 2016 osteopenia  2020 osteoporosis started on fosamax.  05/2021 improved to osteopenia...  now off Fosamax this  year with repeat bone density in 2025 Nonsmoker Hep C screen:  done HIV: done   Problem List Items Addressed This Visit     Essential hypertension, benign    Stable, chronic.  Continue current medication.  ramipril 5 mg p.o. daily      High cholesterol    Stable, chronic.  Continue current medication.  Atorvastatin 10 mg p.o. daily      Osteoporosis    Chronic, on Fosamax since 2020 , stop fosamax  2024 ( when out) and with repeat bone density in 2025.      Other Visit Diagnoses     Routine general medical examination at a health care facility    -  Primary        No orders of the defined types were placed in this encounter.    Kerby Nora, MD

## 2023-01-07 NOTE — Assessment & Plan Note (Signed)
Stable, chronic.  Continue current medication.  ramipril 5 mg p.o. daily

## 2023-01-07 NOTE — Assessment & Plan Note (Signed)
Stable, chronic.  Continue current medication.  Atorvastatin 10 mg p.o. daily

## 2023-01-25 ENCOUNTER — Encounter: Payer: Self-pay | Admitting: Gastroenterology

## 2023-01-25 ENCOUNTER — Ambulatory Visit (AMBULATORY_SURGERY_CENTER): Payer: Medicare HMO

## 2023-01-25 VITALS — Ht 65.5 in | Wt 190.0 lb

## 2023-01-25 DIAGNOSIS — Z800 Family history of malignant neoplasm of digestive organs: Secondary | ICD-10-CM

## 2023-01-25 DIAGNOSIS — Z860100 Personal history of colon polyps, unspecified: Secondary | ICD-10-CM

## 2023-01-25 MED ORDER — NA SULFATE-K SULFATE-MG SULF 17.5-3.13-1.6 GM/177ML PO SOLN: 1.0000 | Freq: Once | ORAL | 0 refills | Status: AC

## 2023-01-25 NOTE — Progress Notes (Signed)
No egg or soy allergy known to patient  No issues known to pt with past sedation with any surgeries or procedures Patient denies ever being told they had issues or difficulty with intubation  No FH of Malignant Hyperthermia Pt is not on diet pills Pt is not on  home 02  Pt is not on blood thinners  Pt denies has with constipation and takes colace No A fib or A flutter Have any cardiac testing pending--no Pt can ambulate independently Pt denies use of chewing tobacco Discussed diabetic I weight loss medication holds Discussed NSAID holds Checked BMI Pt instructed to use Singlecare.com or GoodRx for a price reduction on prep  Patient's chart reviewed by Cathlyn Parsons CNRA prior to previsit and patient appropriate for the [facility].  Pre visit completed and red dot placed by patient's name on their procedure day (on provider's schedule).

## 2023-02-01 ENCOUNTER — Encounter: Payer: Self-pay | Admitting: Certified Registered Nurse Anesthetist

## 2023-02-03 ENCOUNTER — Encounter: Payer: Self-pay | Admitting: Gastroenterology

## 2023-02-03 ENCOUNTER — Ambulatory Visit: Payer: Medicare HMO | Admitting: Gastroenterology

## 2023-02-03 VITALS — BP 136/77 | HR 67 | Temp 97.9°F | Resp 18 | Ht 65.5 in | Wt 190.0 lb

## 2023-02-03 DIAGNOSIS — Z860100 Personal history of colon polyps, unspecified: Secondary | ICD-10-CM

## 2023-02-03 DIAGNOSIS — Z860101 Personal history of adenomatous and serrated colon polyps: Secondary | ICD-10-CM

## 2023-02-03 DIAGNOSIS — K640 First degree hemorrhoids: Secondary | ICD-10-CM

## 2023-02-03 DIAGNOSIS — C181 Malignant neoplasm of appendix: Secondary | ICD-10-CM | POA: Diagnosis not present

## 2023-02-03 DIAGNOSIS — K644 Residual hemorrhoidal skin tags: Secondary | ICD-10-CM | POA: Diagnosis not present

## 2023-02-03 DIAGNOSIS — K649 Unspecified hemorrhoids: Secondary | ICD-10-CM | POA: Diagnosis not present

## 2023-02-03 DIAGNOSIS — Z1211 Encounter for screening for malignant neoplasm of colon: Secondary | ICD-10-CM | POA: Diagnosis not present

## 2023-02-03 DIAGNOSIS — K627 Radiation proctitis: Secondary | ICD-10-CM | POA: Diagnosis not present

## 2023-02-03 DIAGNOSIS — K5520 Angiodysplasia of colon without hemorrhage: Secondary | ICD-10-CM | POA: Diagnosis not present

## 2023-02-03 DIAGNOSIS — I781 Nevus, non-neoplastic: Secondary | ICD-10-CM

## 2023-02-03 MED ORDER — SODIUM CHLORIDE 0.9 % IV SOLN: 500.0000 mL | Freq: Once | INTRAVENOUS | Status: DC

## 2023-02-03 NOTE — Op Note (Signed)
[facility] Patient Name: Melissa [name] Procedure Date: 02/03/2023 1:49 PM MRN: [id number] Endoscopist: Meryl Dare , MD, [phone] Age: 67 Referring MD:  Date of Birth: 1955/11/16 Gender: Female Account #: [id number] Procedure:                Colonoscopy Indications:              High risk colon cancer surveillance: Personal                            history of sessile serrated colon polyp (less than                            10 mm in size) with no dysplasia, adenomatous colon                            polyps and LAMN Medicines:                Monitored Anesthesia Care Procedure:                Pre-Anesthesia Assessment:                           - Prior to the procedure, a History and Physical                            was performed, and patient medications and                            allergies were reviewed. The patient's tolerance of                            previous anesthesia was also reviewed. The risks                            and benefits of the procedure and the sedation                            options and risks were discussed with the patient.                            All questions were answered, and informed consent                            was obtained. Prior Anticoagulants: The patient has                            taken no anticoagulant or antiplatelet agents. ASA                            Grade Assessment: II - A patient with mild systemic                            disease. After reviewing the risks and benefits,  the patient was deemed in satisfactory condition to                            undergo the procedure.                           After obtaining informed consent, the colonoscope                            was passed under direct vision. Throughout the                            procedure, the patient's blood pressure, pulse, and                            oxygen saturations were monitored  continuously. The                            Olympus Scope SN [id number] was introduced through the                            anus and advanced to the the cecum, identified by                            appendiceal orifice and ileocecal valve. The                            ileocecal valve, appendiceal orifice, and rectum                            were photographed. The quality of the bowel                            preparation was adequate after extensive lavage,                            suction. The colonoscopy was performed without                            difficulty. The patient tolerated the procedure                            well. Scope In: 2:04:19 PM Scope Out: 2:22:17 PM Scope Withdrawal Time: 0 hours 13 minutes 29 seconds  Total Procedure Duration: 0 hours 17 minutes 58 seconds  Findings:                 The perianal and digital rectal examinations were                            normal.                           A localized area of telangiectasias, moderately  congested and erythematous mucosa was found in the                            rectum c/w radiation proctopathy.                           External and internal hemorrhoids were found during                            retroflexion. The hemorrhoids were small and Grade                            I (internal hemorrhoids that do not prolapse).                           The exam was otherwise without abnormality on                            direct and retroflexion views. Complications:            No immediate complications. Estimated blood loss:                            None. Estimated Blood Loss:     Estimated blood loss: none. 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                           - External and internal hemorrhoids.                           - The examination was otherwise  normal on direct                            and retroflexion views.                           - No specimens collected. Recommendation:           - Repeat colonoscopy in 3 years for surveillance                            with a more extensive bowel prep.                           - Patient has a contact number available for                            emergencies. The signs and symptoms of potential                            delayed complications were discussed with the  patient. Return to normal activities tomorrow.                            Written discharge instructions were provided to the                            patient.                           - Resume previous diet.                           - Continue present medications. Meryl Dare, MD 02/03/2023 2:28:25 PM This report has been signed electronically.

## 2023-02-03 NOTE — Progress Notes (Signed)
 See 01/07/2023 H&P no changes

## 2023-02-03 NOTE — Patient Instructions (Addendum)
Resume previous diet.  Continue present medications.     YOU HAD AN ENDOSCOPIC PROCEDURE TODAY AT THE [facility]:   Refer to the procedure report that was given to you for any specific questions about what was found during the examination.  If the procedure report does not answer your questions, please call your gastroenterologist to clarify.  If you requested that your care partner not be given the details of your procedure findings, then the procedure report has been included in a sealed envelope for you to review at your convenience later.  YOU SHOULD EXPECT: Some feelings of bloating in the abdomen. Passage of more gas than usual.  Walking can help get rid of the air that was put into your GI tract during the procedure and reduce the bloating. If you had a lower endoscopy (such as a colonoscopy or flexible sigmoidoscopy) you may notice spotting of blood in your stool or on the toilet paper. If you underwent a bowel prep for your procedure, you may not have a normal bowel movement for a few days.  Please Note:  You might notice some irritation and congestion in your nose or some drainage.  This is from the oxygen used during your procedure.  There is no need for concern and it should clear up in a day or so.  SYMPTOMS TO REPORT IMMEDIATELY:  Following lower endoscopy (colonoscopy or flexible sigmoidoscopy):  Excessive amounts of blood in the stool  Significant tenderness or worsening of abdominal pains  Swelling of the abdomen that is new, acute  Fever of 100F or higher   For urgent or emergent issues, a gastroenterologist can be reached at any hour by calling [phone]. Do not use MyChart messaging for urgent concerns.    DIET:  We do recommend a small meal at first, but then you may proceed to your regular diet.  Drink plenty of fluids but you should avoid alcoholic beverages for 24 hours.  ACTIVITY:  You should plan to take it easy for the rest of today and you  should NOT DRIVE or use heavy machinery until tomorrow (because of the sedation medicines used during the test).    FOLLOW UP: Our staff will call the number listed on your records the next business day following your procedure.  We will call around 7:15- 8:00 am to check on you and address any questions or concerns that you may have regarding the information given to you following your procedure. If we do not reach you, we will leave a message.     If any biopsies were taken you will be contacted by phone or by letter within the next 1-3 weeks.  Please call us at [phone] if you have not heard about the biopsies in 3 weeks.    SIGNATURES/CONFIDENTIALITY: You and/or your care partner have signed paperwork which will be entered into your electronic medical record.  These signatures attest to the fact that that the information above on your After Visit Summary has been reviewed and is understood.  Full responsibility of the confidentiality of this discharge information lies with you and/or your care-partner.

## 2023-02-03 NOTE — Progress Notes (Signed)
Report given to PACU, vss 

## 2023-02-03 NOTE — Progress Notes (Signed)
Pt's states no medical or surgical changes since previsit or office visit. w

## 2023-02-04 ENCOUNTER — Telehealth: Payer: Self-pay

## 2023-02-04 NOTE — Telephone Encounter (Signed)
  Follow up Call-     02/03/2023    1:21 PM  Call back number  Post procedure Call Back phone  # [phone]  Permission to leave phone message Yes     Patient questions:  Do you have a fever, pain , or abdominal swelling? No. Pain Score  0 *  Have you tolerated food without any problems? Yes.    Have you been able to return to your normal activities? Yes.    Do you have any questions about your discharge instructions: Diet   No. Medications  No. Follow up visit  No.  Do you have questions or concerns about your Care? No.  Actions: * If pain score is 4 or above: No action needed, pain <4.

## 2023-02-22 ENCOUNTER — Other Ambulatory Visit: Payer: Self-pay | Admitting: Family Medicine

## 2023-04-20 ENCOUNTER — Other Ambulatory Visit: Payer: Self-pay | Admitting: Family Medicine

## 2023-06-15 ENCOUNTER — Other Ambulatory Visit: Payer: Self-pay | Admitting: Family Medicine

## 2023-06-15 NOTE — Telephone Encounter (Signed)
 Last office visit 01/07/2023 for CPE.  Last refilled 04/16/2022 for #90 with no refills.  Next Appt:  No future appointments.

## 2023-06-16 MED ORDER — MELOXICAM 15 MG PO TABS: 15.0000 mg | ORAL_TABLET | Freq: Every day | ORAL | 0 refills | Status: DC | PRN

## 2023-06-16 NOTE — Addendum Note (Signed)
 Addended by: Wyn Heater on: 06/16/2023 02:54 PM   Modules accepted: Orders

## 2023-06-22 ENCOUNTER — Other Ambulatory Visit: Payer: Self-pay | Admitting: Family Medicine

## 2023-06-22 DIAGNOSIS — Z1231 Encounter for screening mammogram for malignant neoplasm of breast: Secondary | ICD-10-CM

## 2023-08-03 ENCOUNTER — Ambulatory Visit
Admission: RE | Admit: 2023-08-03 | Discharge: 2023-08-03 | Disposition: A | Discharge status: Home / Self Care | Source: Ambulatory Visit | Attending: Family Medicine | Admitting: Family Medicine

## 2023-08-03 DIAGNOSIS — Z1231 Encounter for screening mammogram for malignant neoplasm of breast: Secondary | ICD-10-CM | POA: Diagnosis not present

## 2023-08-04 ENCOUNTER — Ambulatory Visit: Payer: Self-pay | Admitting: Family Medicine

## 2023-08-30 DIAGNOSIS — H524 Presbyopia: Secondary | ICD-10-CM | POA: Diagnosis not present

## 2023-09-13 ENCOUNTER — Other Ambulatory Visit: Payer: Self-pay | Admitting: Family Medicine

## 2023-09-28 ENCOUNTER — Other Ambulatory Visit: Payer: Self-pay | Admitting: Family Medicine

## 2023-10-20 ENCOUNTER — Telehealth: Payer: Self-pay | Admitting: *Deleted

## 2023-10-20 ENCOUNTER — Other Ambulatory Visit: Payer: Self-pay | Admitting: Family Medicine

## 2023-10-20 DIAGNOSIS — E7800 Pure hypercholesterolemia, unspecified: Secondary | ICD-10-CM

## 2023-10-20 DIAGNOSIS — E559 Vitamin D deficiency, unspecified: Secondary | ICD-10-CM

## 2023-10-20 NOTE — Telephone Encounter (Signed)
 Please schedule fasting labs appointment prior to CPE 01/11/2024.  Future labs orders in Epic.

## 2023-10-20 NOTE — Telephone Encounter (Signed)
 Copied from CRM [id number]. Topic: Clinical - Request for Lab/Test Order >> Oct 20, 2023 10:46 AM Charolett L wrote: Reason for CRM: patient would like to do labs prior to annual and needs an order put in

## 2023-12-25 ENCOUNTER — Other Ambulatory Visit: Payer: Self-pay | Admitting: Family Medicine

## 2024-01-04 ENCOUNTER — Other Ambulatory Visit

## 2024-01-10 ENCOUNTER — Ambulatory Visit: Payer: Self-pay | Admitting: Family Medicine

## 2024-01-10 ENCOUNTER — Other Ambulatory Visit (INDEPENDENT_AMBULATORY_CARE_PROVIDER_SITE_OTHER)

## 2024-01-10 DIAGNOSIS — E559 Vitamin D deficiency, unspecified: Secondary | ICD-10-CM

## 2024-01-10 DIAGNOSIS — E7800 Pure hypercholesterolemia, unspecified: Secondary | ICD-10-CM

## 2024-01-10 LAB — VITAMIN D 25 HYDROXY (VIT D DEFICIENCY, FRACTURES): VITD: 48.36 ng/mL (ref 30.00–100.00)

## 2024-01-10 LAB — COMPREHENSIVE METABOLIC PANEL WITH GFR
ALT: 17 U/L (ref 0–35)
AST: 20 U/L (ref 0–37)
Albumin: 4 g/dL (ref 3.5–5.2)
Alkaline Phosphatase: 57 U/L (ref 39–117)
BUN: 20 mg/dL (ref 6–23)
CO2: 29 meq/L (ref 19–32)
Calcium: 8.8 mg/dL (ref 8.4–10.5)
Chloride: 103 meq/L (ref 96–112)
Creatinine, Ser: 0.74 mg/dL (ref 0.40–1.20)
GFR: 82.09 mL/min (ref >60.00)
Glucose, Bld: 88 mg/dL (ref 70–99)
Potassium: 4.3 meq/L (ref 3.5–5.1)
Sodium: 139 meq/L (ref 135–145)
Total Bilirubin: 0.7 mg/dL (ref 0.2–1.2)
Total Protein: 6.6 g/dL (ref 6.0–8.3)

## 2024-01-10 LAB — LIPID PANEL
Cholesterol: 107 mg/dL (ref 0–200)
HDL: 41.2 mg/dL (ref >39.00)
LDL Cholesterol: 51 mg/dL (ref 0–99)
NonHDL: 65.82
Total CHOL/HDL Ratio: 3
Triglycerides: 75 mg/dL (ref 0.0–149.0)
VLDL: 15 mg/dL (ref 0.0–40.0)

## 2024-01-10 NOTE — Progress Notes (Signed)
 No critical labs need to be addressed urgently. We will discuss labs in detail at upcoming office visit.

## 2024-01-11 ENCOUNTER — Ambulatory Visit (INDEPENDENT_AMBULATORY_CARE_PROVIDER_SITE_OTHER): Admitting: Family Medicine

## 2024-01-11 VITALS — BP 130/70 | HR 59 | Temp 98.2°F | Ht 60.05 in | Wt 193.0 lb

## 2024-01-11 DIAGNOSIS — Z0000 Encounter for general adult medical examination without abnormal findings: Secondary | ICD-10-CM | POA: Diagnosis not present

## 2024-01-11 DIAGNOSIS — Z8542 Personal history of malignant neoplasm of other parts of uterus: Secondary | ICD-10-CM | POA: Diagnosis not present

## 2024-01-11 DIAGNOSIS — I10 Essential (primary) hypertension: Secondary | ICD-10-CM

## 2024-01-11 DIAGNOSIS — M7062 Trochanteric bursitis, left hip: Secondary | ICD-10-CM | POA: Diagnosis not present

## 2024-01-11 DIAGNOSIS — Z6831 Body mass index (BMI) 31.0-31.9, adult: Secondary | ICD-10-CM | POA: Diagnosis not present

## 2024-01-11 DIAGNOSIS — R09A2 Foreign body sensation, throat: Secondary | ICD-10-CM | POA: Insufficient documentation

## 2024-01-11 DIAGNOSIS — E66811 Obesity, class 1: Secondary | ICD-10-CM

## 2024-01-11 DIAGNOSIS — M816 Localized osteoporosis [Lequesne]: Secondary | ICD-10-CM

## 2024-01-11 DIAGNOSIS — E7800 Pure hypercholesterolemia, unspecified: Secondary | ICD-10-CM

## 2024-01-11 DIAGNOSIS — E6609 Other obesity due to excess calories: Secondary | ICD-10-CM | POA: Diagnosis not present

## 2024-01-11 MED ORDER — PREDNISONE 20 MG PO TABS: ORAL_TABLET | ORAL | 0 refills | Status: AC

## 2024-01-11 NOTE — Assessment & Plan Note (Signed)
Stable, chronic.  Continue current medication.  Atorvastatin 10 mg p.o. daily

## 2024-01-11 NOTE — Assessment & Plan Note (Signed)
Stable, chronic.  Continue current medication.  ramipril 5 mg p.o. daily

## 2024-01-11 NOTE — Patient Instructions (Addendum)
 Please call the location of your choice from the menu below to schedule your  Bone Density appointment.     [facility]   Phone:  [phone]   [address]                                                                            [place], [place] [address]                                            Services: 3D Mammogram and Bone [facility]  Phone:  [phone]   [address]                        [place], [place] [address]                                              Services:  3D Mammogram and Bone Density  Start Pepcid  AC twice daily and decrease acid trigger.. call if not improving after 2-4 weeks.. for further recommendation.

## 2024-01-11 NOTE — Assessment & Plan Note (Signed)
 Acute, likely secondary to silent reflux.  Recommend Pepcid  AC twice daily for 2 to 4 weeks (PPI contraindicated given osteoporosis history.) Avoid acidic triggers.  Decrease spicy foods such as Mexican. If not improving consider pantoprazole course versus GI referral.

## 2024-01-11 NOTE — Assessment & Plan Note (Signed)
 Chronic, on Fosamax  since 2020 , stop fosamax   2024 ( when out) and with repeat bone density in 2025... DUE

## 2024-01-11 NOTE — Assessment & Plan Note (Signed)
 Acute, symptoms and exam most consistent with left trochanteric bursitis.  There may also be a mild sciatica component although negative straight leg raise today. Will treat with prednisone  taper and she will start home physical therapy.  She can hold meloxicam  while on prednisone .  If not improving as expected consider further evaluation with x-ray of lumbar spine.  Return and ER precautions provided.

## 2024-01-11 NOTE — Assessment & Plan Note (Addendum)
 History of in 2012, S/P TAH and chemo study

## 2024-01-11 NOTE — Assessment & Plan Note (Signed)
 Encouraged exercise, weight loss, healthy eating habits. ? ?

## 2024-01-11 NOTE — Progress Notes (Signed)
 Patient ID: Melissa [name], female    DOB: 11/01/1955, 69 y.o.   MRN: [id number]  This visit was conducted in person.  BP 130/70   Pulse (!) 59   Temp 98.2 F (36.8 C) (Oral)   Ht 5' 0.05 (1.525 m)   Wt 193 lb (87.5 kg)   SpO2 95%   BMI 37.63 kg/m    CC:  Chief Complaint  Patient presents with   Annual Exam    Subjective:   HPI: The patient presents for  complete physical and review of chronic health problems. He/She also has the following acute concerns today:   Left hip pain x 6-8 months, no associated injury or fall.  Has had to decrease treadmill. Pain in lateral, but occ in buttock.. some radiation to left  calf.  No numbness or weakness. No low back pain.  Has tried meloxicam .. helps some.   Has sensation of something in thraot.[name] occ mucus. Eating mexican food a lot.  Has occ heart burn.[name]-3 days a week.  No nasal congestion/sneezing.   Hypertension:  Well-controlled on ramipril  5 mg p.o. daily  BP Readings from Last 3 Encounters:  01/11/24 130/70  02/03/23 136/77  01/07/23 110/70  Using medication without problems or lightheadedness:  none Chest pain with exertion: none Edema:none Short of breath:none Average home BPs: Other issues:  Elevated Cholesterol: LDL at goal on atorvastatin  10 mg p.o. daily Lab Results  Component Value Date   CHOL 107 01/10/2024   HDL 41.20 01/10/2024   LDLCALC 51 01/10/2024   TRIG 75.0 01/10/2024   CHOLHDL 3 01/10/2024  Using medications without problems:none Muscle aches:  none Diet compliance: heart health diet. Exercise: [employer] 5 days a week for  4 miles treadmill and 2 more days of weights. Other complaints: Wt Readings from Last 3 Encounters:  01/11/24 193 lb (87.5 kg)  02/03/23 190 lb (86.2 kg)  01/25/23 190 lb (86.2 kg)    Flowsheet Row Office Visit from 01/11/2024 in [facility] Total Score 2        Relevant past medical, surgical, family and social history  reviewed and updated as indicated. Interim medical history since our last visit reviewed. Allergies and medications reviewed and updated. Outpatient Medications Prior to Visit  Medication Sig Dispense Refill   atorvastatin  (LIPITOR) 10 MG tablet TAKE 1 TABLET BY MOUTH EVERY DAY 90 tablet 3   bisacodyl (DULCOLAX) 5 MG EC tablet Take 5 mg by mouth daily as needed for moderate constipation.     calcium  carbonate (TUMS - DOSED IN MG ELEMENTAL CALCIUM ) 500 MG chewable tablet Chew 1 tablet by mouth 2 (two) times daily.      cholecalciferol (VITAMIN D ) 1000 units tablet Take 1,000 Units by mouth daily.     diphenhydrAMINE HCl (BENADRYL PO) Take by mouth.     docusate sodium (COLACE) 100 MG capsule Take 100 mg by mouth daily as needed for mild constipation.     glucosamine-chondroitin 500-400 MG tablet Take 2 tablets by mouth daily.     meloxicam  (MOBIC ) 15 MG tablet TAKE 1 TABLET BY MOUTH EVERY DAY AS NEEDED FOR PAIN 90 tablet 0   Multiple Vitamins-Minerals (PRESERVISION AREDS PO) Take by mouth.     ramipril  (ALTACE ) 5 MG capsule TAKE 1 CAPSULE BY MOUTH EVERY DAY IN THE MORNING 90 capsule 0   No facility-administered medications prior to visit.     Per HPI unless specifically indicated in ROS section below Review  of Systems  Constitutional:  Negative for fatigue and fever.  HENT:  Negative for congestion.   Eyes:  Negative for pain.  Respiratory:  Negative for cough and shortness of breath.   Cardiovascular:  Negative for chest pain, palpitations and leg swelling.  Gastrointestinal:  Negative for abdominal pain.  Genitourinary:  Negative for dysuria and vaginal bleeding.  Musculoskeletal:  Negative for back pain.  Neurological:  Negative for syncope, light-headedness and headaches.  Psychiatric/Behavioral:  Negative for dysphoric mood.    Objective:  BP 130/70   Pulse (!) 59   Temp 98.2 F (36.8 C) (Oral)   Ht 5' 0.05 (1.525 m)   Wt 193 lb (87.5 kg)   SpO2 95%   BMI 37.63 kg/m   Wt  Readings from Last 3 Encounters:  01/11/24 193 lb (87.5 kg)  02/03/23 190 lb (86.2 kg)  01/25/23 190 lb (86.2 kg)      Physical Exam Vitals and nursing note reviewed.  Constitutional:      General: She is not in acute distress.    Appearance: Normal appearance. She is well-developed. She is not ill-appearing or toxic-appearing.  HENT:     Head: Normocephalic.     Right Ear: Hearing, tympanic membrane, ear canal and external ear normal. Tympanic membrane is not erythematous, retracted or bulging.     Left Ear: Hearing, tympanic membrane, ear canal and external ear normal. Tympanic membrane is not erythematous, retracted or bulging.     Nose: Nose normal. No mucosal edema or rhinorrhea.     Right Sinus: No maxillary sinus tenderness or frontal sinus tenderness.     Left Sinus: No maxillary sinus tenderness or frontal sinus tenderness.     Mouth/Throat:     Pharynx: Uvula midline.  Eyes:     General: Lids are normal. Lids are everted, no foreign bodies appreciated.     Conjunctiva/sclera: Conjunctivae normal.     Pupils: Pupils are equal, round, and reactive to light.  Neck:     Thyroid: No thyroid mass or thyromegaly.     Vascular: No carotid bruit.     Trachea: Trachea normal.  Cardiovascular:     Rate and Rhythm: Normal rate and regular rhythm.     Pulses: Normal pulses.     Heart sounds: Normal heart sounds, S1 normal and S2 normal. No murmur heard.    No friction rub. No gallop.  Pulmonary:     Effort: Pulmonary effort is normal. No tachypnea or respiratory distress.     Breath sounds: Normal breath sounds. No decreased breath sounds, wheezing, rhonchi or rales.  Abdominal:     General: Bowel sounds are normal. There is no distension or abdominal bruit.     Palpations: Abdomen is soft. There is no fluid wave or mass.     Tenderness: There is no abdominal tenderness. There is no guarding or rebound.     Hernia: No hernia is present.  Musculoskeletal:     Cervical back:  Normal range of motion and neck supple.     Thoracic back: Normal.     Lumbar back: Tenderness present. No bony tenderness. Normal range of motion. Negative right straight leg raise test and negative left straight leg raise test.     Right hip: No tenderness or bony tenderness. Normal range of motion.     Left hip: Tenderness present. No bony tenderness. Normal range of motion.     Comments: Ttp over lafet lateral hip at trochanteric bursa. Ttp mildly in left  sciatic notch  Lymphadenopathy:     Cervical: No cervical adenopathy.  Skin:    General: Skin is warm and dry.     Findings: No rash.  Neurological:     Mental Status: She is alert.     Cranial Nerves: No cranial nerve deficit.     Sensory: No sensory deficit.  Psychiatric:        Mood and Affect: Mood is not anxious or depressed.        Speech: Speech normal.        Behavior: Behavior normal. Behavior is cooperative.        Thought Content: Thought content normal.        Judgment: Judgment normal.       Results for orders placed or performed in visit on 01/10/24  VITAMIN D  25 Hydroxy (Vit-D Deficiency, Fractures)   Collection Time: 01/10/24  8:57 AM  Result Value Ref Range   VITD 48.36 30.00 - 100.00 ng/mL  Lipid panel   Collection Time: 01/10/24  8:57 AM  Result Value Ref Range   Cholesterol 107 0 - 200 mg/dL   Triglycerides 24.9 0.0 - 149.0 mg/dL   HDL 58.79 >60.99 mg/dL   VLDL 84.9 0.0 - 59.9 mg/dL   LDL Cholesterol 51 0 - 99 mg/dL   Total CHOL/HDL Ratio 3    NonHDL 65.82   Comprehensive metabolic panel   Collection Time: 01/10/24  8:57 AM  Result Value Ref Range   Sodium 139 135 - 145 mEq/L   Potassium 4.3 3.5 - 5.1 mEq/L   Chloride 103 96 - 112 mEq/L   CO2 29 19 - 32 mEq/L   Glucose, Bld 88 70 - 99 mg/dL   BUN 20 6 - 23 mg/dL   Creatinine, Ser 9.25 0.40 - 1.20 mg/dL   Total Bilirubin 0.7 0.2 - 1.2 mg/dL   Alkaline Phosphatase 57 39 - 117 U/L   AST 20 0 - 37 U/L   ALT 17 0 - 35 U/L   Total Protein 6.6  6.0 - 8.3 g/dL   Albumin 4.0 3.5 - 5.2 g/dL   GFR 17.90 >39.99 mL/min   Calcium  8.8 8.4 - 10.5 mg/dL     COVID 19 screen:  No recent travel or known exposure to COVID19 The patient denies respiratory symptoms of COVID 19 at this time. The importance of social distancing was discussed today.   Assessment and Plan The patient's preventative maintenance and recommended screening tests for an annual wellness exam were reviewed in full today. Brought up to date unless services declined.  Counselled on the importance of diet, exercise, and its role in overall health and mortality. The patient's FH and SH was reviewed, including their home life, tobacco status, and drug and alcohol status.    No pap recommended. No vaginal issues, no lower abdominal pain. Vaccines: due for Td, uptodate  flu, PNA,  shingrix Colon: Dr. Aneita. 07/2022 repeat 3 years.[name]  Hx of appendiceal cancer Mammogram: 07/2023 DEXA: mother with osteoporosis, no other risk factors.. 2016 osteopenia  2020 osteoporosis started on fosamax .  05/2021 improved to osteopenia...  now off Fosamax  2024 with repeat bone density in 2025 Nonsmoker Hep C screen:  done HIV: done   Problem List Items Addressed This Visit     Essential hypertension, benign   Stable, chronic.  Continue current medication.  ramipril  5 mg p.o. daily      Foreign body sensation in throat   Acute, likely secondary to silent reflux.  Recommend  Pepcid  AC twice daily for 2 to 4 weeks (PPI contraindicated given osteoporosis history.) Avoid acidic triggers.  Decrease spicy foods such as Mexican. If not improving consider pantoprazole course versus GI referral.      High cholesterol   Stable, chronic.  Continue current medication.  Atorvastatin  10 mg p.o. daily      History of endometrial cancer   History of in 2012, S/P TAH and chemo study      Obesity due to excess calories with serious comorbidity   Encouraged exercise, weight loss, healthy eating  habits.       Osteoporosis   Chronic, on Fosamax  since 2020 , stop fosamax   2024 ( when out) and with repeat bone density in 2025... DUE      Relevant Orders   DG Bone Density   Trochanteric bursitis of left hip   Acute, symptoms and exam most consistent with left trochanteric bursitis.  There may also be a mild sciatica component although negative straight leg raise today. Will treat with prednisone taper and she will start home physical therapy.  She can hold meloxicam  while on prednisone.  If not improving as expected consider further evaluation with x-ray of lumbar spine.  Return and ER precautions provided.      Other Visit Diagnoses       Routine general medical examination at a health care facility    -  Primary         Meds ordered this encounter  Medications   predniSONE (DELTASONE) 20 MG tablet    Sig: 3 tabs by mouth daily x 3 days, then 2 tabs by mouth daily x 2 days then 1 tab by mouth daily x 2 days    Dispense:  15 tablet    Refill:  0     Greig Ring, MD

## 2024-01-16 DIAGNOSIS — R051 Acute cough: Secondary | ICD-10-CM | POA: Diagnosis not present

## 2024-01-16 DIAGNOSIS — J0100 Acute maxillary sinusitis, unspecified: Secondary | ICD-10-CM | POA: Diagnosis not present

## 2024-02-25 ENCOUNTER — Other Ambulatory Visit: Payer: Self-pay | Admitting: Family Medicine

## 2024-03-23 ENCOUNTER — Other Ambulatory Visit: Payer: Self-pay | Admitting: Family Medicine

## 2024-04-06 ENCOUNTER — Ambulatory Visit

## 2025-01-04 ENCOUNTER — Other Ambulatory Visit

## 2025-01-11 ENCOUNTER — Encounter: Admitting: Family Medicine
